# Patient Record
Sex: Female | Born: 1945 | Race: Black or African American | Hispanic: No | Marital: Single | Smoking: Never smoker
Health system: Southern US, Community
[De-identification: ages and names within clinical notes are randomized; demographics above are authoritative.]

## PROBLEM LIST (undated history)

## (undated) ENCOUNTER — Emergency Department (HOSPITAL_BASED_OUTPATIENT_CLINIC_OR_DEPARTMENT_OTHER): Discharge status: Absent without leave | Payer: Medicare Other

## (undated) DIAGNOSIS — I10 Essential (primary) hypertension: Secondary | ICD-10-CM

## (undated) DIAGNOSIS — E119 Type 2 diabetes mellitus without complications: Secondary | ICD-10-CM

## (undated) DIAGNOSIS — I639 Cerebral infarction, unspecified: Secondary | ICD-10-CM

## (undated) DIAGNOSIS — F32A Depression, unspecified: Secondary | ICD-10-CM

## (undated) DIAGNOSIS — I509 Heart failure, unspecified: Secondary | ICD-10-CM

## (undated) DIAGNOSIS — F319 Bipolar disorder, unspecified: Secondary | ICD-10-CM

## (undated) DIAGNOSIS — D649 Anemia, unspecified: Secondary | ICD-10-CM

## (undated) DIAGNOSIS — F329 Major depressive disorder, single episode, unspecified: Secondary | ICD-10-CM

## (undated) DIAGNOSIS — G629 Polyneuropathy, unspecified: Secondary | ICD-10-CM

---

## 2005-08-18 ENCOUNTER — Emergency Department (HOSPITAL_COMMUNITY): Admission: EM | Admit: 2005-08-18 | Discharge: 2005-08-18 | Payer: Self-pay | Admitting: Emergency Medicine

## 2013-02-17 ENCOUNTER — Emergency Department (HOSPITAL_COMMUNITY)
Admission: EM | Admit: 2013-02-17 | Discharge: 2013-02-17 | Disposition: A | Discharge status: Home / Self Care | Payer: Medicare Other | Attending: Emergency Medicine | Admitting: Emergency Medicine

## 2013-02-17 ENCOUNTER — Encounter (HOSPITAL_COMMUNITY): Payer: Self-pay | Admitting: *Deleted

## 2013-02-17 DIAGNOSIS — I129 Hypertensive chronic kidney disease with stage 1 through stage 4 chronic kidney disease, or unspecified chronic kidney disease: Secondary | ICD-10-CM | POA: Insufficient documentation

## 2013-02-17 DIAGNOSIS — N289 Disorder of kidney and ureter, unspecified: Secondary | ICD-10-CM | POA: Insufficient documentation

## 2013-02-17 DIAGNOSIS — Z794 Long term (current) use of insulin: Secondary | ICD-10-CM | POA: Insufficient documentation

## 2013-02-17 DIAGNOSIS — R059 Cough, unspecified: Secondary | ICD-10-CM | POA: Insufficient documentation

## 2013-02-17 DIAGNOSIS — R5381 Other malaise: Secondary | ICD-10-CM | POA: Insufficient documentation

## 2013-02-17 DIAGNOSIS — G589 Mononeuropathy, unspecified: Secondary | ICD-10-CM | POA: Insufficient documentation

## 2013-02-17 DIAGNOSIS — E1149 Type 2 diabetes mellitus with other diabetic neurological complication: Secondary | ICD-10-CM | POA: Insufficient documentation

## 2013-02-17 DIAGNOSIS — R05 Cough: Secondary | ICD-10-CM | POA: Insufficient documentation

## 2013-02-17 DIAGNOSIS — R531 Weakness: Secondary | ICD-10-CM

## 2013-02-17 DIAGNOSIS — I509 Heart failure, unspecified: Secondary | ICD-10-CM | POA: Insufficient documentation

## 2013-02-17 DIAGNOSIS — R109 Unspecified abdominal pain: Secondary | ICD-10-CM | POA: Insufficient documentation

## 2013-02-17 DIAGNOSIS — D649 Anemia, unspecified: Secondary | ICD-10-CM | POA: Insufficient documentation

## 2013-02-17 DIAGNOSIS — R002 Palpitations: Secondary | ICD-10-CM | POA: Insufficient documentation

## 2013-02-17 DIAGNOSIS — Z7982 Long term (current) use of aspirin: Secondary | ICD-10-CM | POA: Insufficient documentation

## 2013-02-17 DIAGNOSIS — Z79899 Other long term (current) drug therapy: Secondary | ICD-10-CM | POA: Insufficient documentation

## 2013-02-17 DIAGNOSIS — R0602 Shortness of breath: Secondary | ICD-10-CM | POA: Insufficient documentation

## 2013-02-17 HISTORY — DX: Cerebral infarction, unspecified: I63.9

## 2013-02-17 HISTORY — DX: Essential (primary) hypertension: I10

## 2013-02-17 HISTORY — DX: Anemia, unspecified: D64.9

## 2013-02-17 HISTORY — DX: Polyneuropathy, unspecified: G62.9

## 2013-02-17 HISTORY — DX: Heart failure, unspecified: I50.9

## 2013-02-17 HISTORY — DX: Type 2 diabetes mellitus without complications: E11.9

## 2013-02-17 LAB — URINALYSIS, ROUTINE W REFLEX MICROSCOPIC
Ketones, ur: NEGATIVE mg/dL
Leukocytes, UA: NEGATIVE
Nitrite: NEGATIVE
Protein, ur: NEGATIVE mg/dL
Specific Gravity, Urine: 1.01 (ref 1.005–1.030)
pH: 6 (ref 5.0–8.0)

## 2013-02-17 LAB — COMPREHENSIVE METABOLIC PANEL
Alkaline Phosphatase: 99 U/L (ref 39–117)
BUN: 14 mg/dL (ref 6–23)
Chloride: 95 mEq/L — ABNORMAL LOW (ref 96–112)
GFR calc Af Amer: 45 mL/min — ABNORMAL LOW (ref >90)
Glucose, Bld: 216 mg/dL — ABNORMAL HIGH (ref 70–99)
Potassium: 3.9 mEq/L (ref 3.5–5.1)
Total Bilirubin: 0.2 mg/dL — ABNORMAL LOW (ref 0.3–1.2)
Total Protein: 7.1 g/dL (ref 6.0–8.3)

## 2013-02-17 LAB — CBC WITH DIFFERENTIAL/PLATELET
Eosinophils Absolute: 0.7 10*3/uL (ref 0.0–0.7)
Eosinophils Relative: 9 % — ABNORMAL HIGH (ref 0–5)
HCT: 32 % — ABNORMAL LOW (ref 36.0–46.0)
Hemoglobin: 10.4 g/dL — ABNORMAL LOW (ref 12.0–15.0)
Lymphs Abs: 1.9 10*3/uL (ref 0.7–4.0)
MCH: 26.5 pg (ref 26.0–34.0)
MCV: 81.6 fL (ref 78.0–100.0)
Monocytes Absolute: 0.6 10*3/uL (ref 0.1–1.0)
Monocytes Relative: 8 % (ref 3–12)
Neutrophils Relative %: 60 % (ref 43–77)
RBC: 3.92 MIL/uL (ref 3.87–5.11)

## 2013-02-17 NOTE — ED Provider Notes (Signed)
CSN: [id number]     Arrival date & time 02/17/13  1959 History   First MD Initiated Contact with Patient 02/17/13 2208     Chief Complaint  Patient presents with  . Weakness  . Abdominal Pain   (Consider location/radiation/quality/duration/timing/severity/associated sxs/prior Treatment) HPI Comments: Samantha [name] is a 67 y.o. female who states that she has been weak and persistently for 2 days. She also has mild cough without sputum production, occasional shortness of breath and palpitations. She denies chest pain, headache, fever, chills, back pain, or change in bowel and urinary habits. She occasionally has dark stool, but she thinks is related to the iron that she takes. She has chronic intermittent anemia and has required blood transfusions, yearly for several years. She has seen a doctor and been evaluated for source of bleeding, but none has been found. She has had upper and lower endoscopy. She has not had a bone marrow evaluation. She has received Procrit in the past, but not recently. She has known renal insufficiency. She is new to town and has established with a primary care doctor. She had blood work done several months ago. There are no other known modifying factors.   Patient is a 68 y.o. female presenting with weakness and abdominal pain. The history is provided by the patient.  Weakness Associated symptoms include abdominal pain.  Abdominal Pain   Past Medical History  Diagnosis Date  . Anemia   . Stroke   . Diabetes mellitus without complication   . Hypertension   . Neuropathy   . CHF (congestive heart failure)    Past Surgical History  Procedure Laterality Date  . Cesarean section     No family history on file. History  Substance Use Topics  . Smoking status: Never Smoker   . Smokeless tobacco: Not on file  . Alcohol Use: No   OB History   Grav Para Term Preterm Abortions TAB SAB Ect Mult Living                 Review of Systems  Gastrointestinal:  Positive for abdominal pain.  Neurological: Positive for weakness.  All other systems reviewed and are negative.    Allergies  Peanut-containing drug products  Home Medications   Current Outpatient Rx  Name  Route  Sig  Dispense  Refill  . albuterol (PROVENTIL HFA;VENTOLIN HFA) 108 (90 BASE) MCG/ACT inhaler   Inhalation   Inhale 2 puffs into the lungs every 6 (six) hours as needed for wheezing.         Marland Kitchen allopurinol (ZYLOPRIM) 300 MG tablet   Oral   Take 300 mg by mouth daily.         Marland Kitchen aspirin EC 81 MG tablet   Oral   Take 81 mg by mouth daily.         Marland Kitchen atorvastatin (LIPITOR) 20 MG tablet   Oral   Take 20 mg by mouth daily.         . cloNIDine (CATAPRES - DOSED IN MG/24 HR) 0.3 mg/24hr patch   Transdermal   Place 1 patch onto the skin once a week.         Marland Kitchen dexlansoprazole (DEXILANT) 60 MG capsule   Oral   Take 60 mg by mouth daily.         Marland Kitchen epoetin alfa (EPOGEN,PROCRIT) 91478 UNIT/ML injection   Subcutaneous   Inject 10,000 Units into the skin every 14 (fourteen) days. As needed for kidneys         .  ferrous sulfate 325 (65 FE) MG tablet   Oral   Take 325 mg by mouth daily with breakfast.         . furosemide (LASIX) 20 MG tablet   Oral   Take 20 mg by mouth.         . gabapentin (NEURONTIN) 300 MG capsule   Oral   Take 300 mg by mouth 3 (three) times daily.         . hydrALAZINE (APRESOLINE) 50 MG tablet   Oral   Take 50 mg by mouth 3 (three) times daily.         . insulin glargine (LANTUS) 100 UNIT/ML injection   Subcutaneous   Inject 10-80 Units into the skin 2 (two) times daily. 10 units in the morning and 80 units at night         . Liraglutide (VICTOZA) 18 MG/3ML SOPN   Subcutaneous   Inject into the skin.         Marland Kitchen LORazepam (ATIVAN) 0.5 MG tablet   Oral   Take 0.5 mg by mouth every 8 (eight) hours.         . metoprolol (TOPROL-XL) 200 MG 24 hr tablet   Oral   Take 200 mg by mouth daily.         .  QUEtiapine Fumarate (SEROQUEL XR) 150 MG 24 hr tablet   Oral   Take 150 mg by mouth at bedtime. Started with 75 mg at bedtime for 1 week on 01/23/13         . venlafaxine (EFFEXOR) 75 MG tablet   Oral   Take 75 mg by mouth 2 (two) times daily.          BP 143/65  Pulse 69  Temp(Src) 98.5 F (36.9 C) (Oral)  Resp 16  SpO2 98% Physical Exam  Nursing note and vitals reviewed. Constitutional: She is oriented to person, place, and time. She appears well-developed.  Frail, elderly. Appears older than stated age.  HENT:  Head: Normocephalic and atraumatic.  Eyes: Conjunctivae and EOM are normal. Pupils are equal, round, and reactive to light.  Neck: Normal range of motion and phonation normal. Neck supple.  Cardiovascular: Normal rate, regular rhythm and intact distal pulses.   Pulmonary/Chest: Effort normal and breath sounds normal. She exhibits no tenderness.  Abdominal: Soft. She exhibits no distension. There is no tenderness. There is no guarding.  Genitourinary:  Brown stool in rectal vault. No rectal mass or bleeding.  Musculoskeletal: Normal range of motion.  Neurological: She is alert and oriented to person, place, and time. She exhibits normal muscle tone.  Skin: Skin is warm and dry.  Psychiatric: She has a normal mood and affect. Her behavior is normal. Judgment and thought content normal.    ED Course  Procedures (including critical care time) Labs Review Labs Reviewed  CBC WITH DIFFERENTIAL - Abnormal; Notable for the following:    Hemoglobin 10.4 (*)    HCT 32.0 (*)    RDW 17.9 (*)    Eosinophils Relative 9 (*)    All other components within normal limits  COMPREHENSIVE METABOLIC PANEL - Abnormal; Notable for the following:    Sodium 129 (*)    Chloride 95 (*)    Glucose, Bld 216 (*)    Creatinine, Ser 1.39 (*)    Albumin 3.3 (*)    Total Bilirubin 0.2 (*)    GFR calc non Af Amer 39 (*)    GFR calc Af Samantha [name]  45 (*)    All other components within normal  limits  URINALYSIS, ROUTINE W REFLEX MICROSCOPIC  OCCULT BLOOD, POC DEVICE   Imaging Review No results found.  MDM   1. Weakness   2. Anemia      Nonspecific weakness, with mild anemia, and renal insufficiency. No comparison labs are available. The patient is nontoxic. She has a followup appointment scheduled with her PCP, in 2 days.    Nursing Notes Reviewed/ Care Coordinated, and agree without changes. Applicable Imaging Reviewed.  Interpretation of Laboratory Data incorporated into ED treatment    Plan: Home Medications- usual; Home Treatments and Observation- rest, fluids; return here if the recommended treatment, does not improve the symptoms; Recommended follow up- PCP, as scheduled. She may need referral to a nephrologist or a hematologist for further evaluation, as an outpatient.     Flint Melter, MD 02/18/13 [phone]

## 2013-02-17 NOTE — ED Notes (Signed)
Pt c/o weakness x 2 days; abd pain; states stools are always dark due to iron pills; history of anemia; cough; some shortness of breath

## 2013-02-17 NOTE — Progress Notes (Signed)
Patient confirms her pcp is Dr. Burton Apley and has an appointment on Wednesday.  Sysytem updated.

## 2013-07-14 ENCOUNTER — Ambulatory Visit: Payer: Self-pay | Admitting: Podiatry

## 2013-08-29 ENCOUNTER — Emergency Department (EMERGENCY_DEPARTMENT_HOSPITAL)
Admission: EM | Admit: 2013-08-29 | Discharge: 2013-08-29 | Disposition: A | Discharge status: Other Acute Inpatient Hospital | Payer: Medicare Other | Source: Home / Self Care | Attending: Emergency Medicine | Admitting: Emergency Medicine

## 2013-08-29 ENCOUNTER — Encounter (HOSPITAL_COMMUNITY): Payer: Self-pay | Admitting: *Deleted

## 2013-08-29 ENCOUNTER — Inpatient Hospital Stay (HOSPITAL_COMMUNITY)
Admission: AD | Admit: 2013-08-29 | Discharge: 2013-08-31 | DRG: 885 | Disposition: A | Discharge status: Other Acute Inpatient Hospital | Payer: Medicare Other | Source: Intra-hospital | Attending: Internal Medicine | Admitting: Internal Medicine

## 2013-08-29 ENCOUNTER — Encounter (HOSPITAL_COMMUNITY): Payer: Self-pay | Admitting: Emergency Medicine

## 2013-08-29 DIAGNOSIS — F319 Bipolar disorder, unspecified: Secondary | ICD-10-CM

## 2013-08-29 DIAGNOSIS — R45851 Suicidal ideations: Secondary | ICD-10-CM

## 2013-08-29 DIAGNOSIS — F315 Bipolar disorder, current episode depressed, severe, with psychotic features: Secondary | ICD-10-CM

## 2013-08-29 DIAGNOSIS — I10 Essential (primary) hypertension: Secondary | ICD-10-CM | POA: Diagnosis present

## 2013-08-29 DIAGNOSIS — R739 Hyperglycemia, unspecified: Secondary | ICD-10-CM

## 2013-08-29 DIAGNOSIS — E119 Type 2 diabetes mellitus without complications: Secondary | ICD-10-CM | POA: Diagnosis present

## 2013-08-29 DIAGNOSIS — I509 Heart failure, unspecified: Secondary | ICD-10-CM | POA: Diagnosis present

## 2013-08-29 DIAGNOSIS — F29 Unspecified psychosis not due to a substance or known physiological condition: Secondary | ICD-10-CM | POA: Diagnosis present

## 2013-08-29 DIAGNOSIS — Z8673 Personal history of transient ischemic attack (TIA), and cerebral infarction without residual deficits: Secondary | ICD-10-CM

## 2013-08-29 DIAGNOSIS — F411 Generalized anxiety disorder: Secondary | ICD-10-CM | POA: Diagnosis present

## 2013-08-29 DIAGNOSIS — F3130 Bipolar disorder, current episode depressed, mild or moderate severity, unspecified: Principal | ICD-10-CM | POA: Diagnosis present

## 2013-08-29 DIAGNOSIS — Z794 Long term (current) use of insulin: Secondary | ICD-10-CM

## 2013-08-29 HISTORY — DX: Major depressive disorder, single episode, unspecified: F32.9

## 2013-08-29 HISTORY — DX: Depression, unspecified: F32.A

## 2013-08-29 HISTORY — DX: Bipolar disorder, unspecified: F31.9

## 2013-08-29 LAB — BASIC METABOLIC PANEL
BUN: 25 mg/dL — ABNORMAL HIGH (ref 6–23)
CALCIUM: 9.9 mg/dL (ref 8.4–10.5)
CHLORIDE: 94 meq/L — AB (ref 96–112)
CO2: 26 meq/L (ref 19–32)
Creatinine, Ser: 1.62 mg/dL — ABNORMAL HIGH (ref 0.50–1.10)
GFR calc Af Amer: 37 mL/min — ABNORMAL LOW (ref >90)
GFR calc non Af Amer: 32 mL/min — ABNORMAL LOW (ref >90)
Glucose, Bld: 291 mg/dL — ABNORMAL HIGH (ref 70–99)
Potassium: 5.1 mEq/L (ref 3.7–5.3)
Sodium: 133 mEq/L — ABNORMAL LOW (ref 137–147)

## 2013-08-29 LAB — RAPID URINE DRUG SCREEN, HOSP PERFORMED
Amphetamines: NOT DETECTED
Barbiturates: NOT DETECTED
Benzodiazepines: NOT DETECTED
Cocaine: NOT DETECTED
OPIATES: NOT DETECTED
Tetrahydrocannabinol: NOT DETECTED

## 2013-08-29 LAB — URINALYSIS, ROUTINE W REFLEX MICROSCOPIC
Bilirubin Urine: NEGATIVE
GLUCOSE, UA: NEGATIVE mg/dL
HGB URINE DIPSTICK: NEGATIVE
Ketones, ur: NEGATIVE mg/dL
Leukocytes, UA: NEGATIVE
Nitrite: NEGATIVE
Protein, ur: NEGATIVE mg/dL
SPECIFIC GRAVITY, URINE: 1.008 (ref 1.005–1.030)
Urobilinogen, UA: 0.2 mg/dL (ref 0.0–1.0)
pH: 6 (ref 5.0–8.0)

## 2013-08-29 LAB — GLUCOSE, CAPILLARY
GLUCOSE-CAPILLARY: 270 mg/dL — AB (ref 70–99)
GLUCOSE-CAPILLARY: 277 mg/dL — AB (ref 70–99)

## 2013-08-29 LAB — CBC WITH DIFFERENTIAL/PLATELET
Basophils Absolute: 0 10*3/uL (ref 0.0–0.1)
Basophils Relative: 0 % (ref 0–1)
Eosinophils Absolute: 0.7 10*3/uL (ref 0.0–0.7)
Eosinophils Relative: 7 % — ABNORMAL HIGH (ref 0–5)
HEMATOCRIT: 36 % (ref 36.0–46.0)
HEMOGLOBIN: 11.3 g/dL — AB (ref 12.0–15.0)
LYMPHS PCT: 18 % (ref 12–46)
Lymphs Abs: 1.9 10*3/uL (ref 0.7–4.0)
MCH: 24.6 pg — ABNORMAL LOW (ref 26.0–34.0)
MCHC: 31.4 g/dL (ref 30.0–36.0)
MCV: 78.4 fL (ref 78.0–100.0)
MONO ABS: 0.6 10*3/uL (ref 0.1–1.0)
MONOS PCT: 6 % (ref 3–12)
Neutro Abs: 7.2 10*3/uL (ref 1.7–7.7)
Neutrophils Relative %: 69 % (ref 43–77)
Platelets: 290 10*3/uL (ref 150–400)
RBC: 4.59 MIL/uL (ref 3.87–5.11)
RDW: 20.2 % — ABNORMAL HIGH (ref 11.5–15.5)
WBC: 10.5 10*3/uL (ref 4.0–10.5)

## 2013-08-29 LAB — ACETAMINOPHEN LEVEL: Acetaminophen (Tylenol), Serum: <15 ug/mL (ref 10–30)

## 2013-08-29 LAB — CBG MONITORING, ED: Glucose-Capillary: 211 mg/dL — ABNORMAL HIGH (ref 70–99)

## 2013-08-29 LAB — SALICYLATE LEVEL: Salicylate Lvl: 5.2 mg/dL (ref 2.8–20.0)

## 2013-08-29 LAB — ETHANOL

## 2013-08-29 MED ORDER — VENLAFAXINE HCL 75 MG PO TABS
75.0000 mg | ORAL_TABLET | Freq: Two times a day (BID) | ORAL | Status: DC
  Filled 2013-08-29 (×2): qty 1

## 2013-08-29 MED ORDER — LURASIDONE HCL 40 MG PO TABS
40.0000 mg | ORAL_TABLET | Freq: Every day | ORAL | Status: DC
  Filled 2013-08-29: qty 1

## 2013-08-29 MED ORDER — INSULIN GLARGINE 100 UNIT/ML ~~LOC~~ SOLN: 10.0000 [IU] | Freq: Every day | SUBCUTANEOUS | Status: DC

## 2013-08-29 MED ORDER — INSULIN ASPART 100 UNIT/ML ~~LOC~~ SOLN
0.0000 [IU] | Freq: Three times a day (TID) | SUBCUTANEOUS | Status: DC
  Administered 2013-08-29: 8 [IU] via SUBCUTANEOUS
  Administered 2013-08-30: 5 [IU] via SUBCUTANEOUS
  Administered 2013-08-30: 2 [IU] via SUBCUTANEOUS
  Administered 2013-08-30: 15 [IU] via SUBCUTANEOUS
  Administered 2013-08-31: 3 [IU] via SUBCUTANEOUS

## 2013-08-29 MED ORDER — ATORVASTATIN CALCIUM 20 MG PO TABS
20.0000 mg | ORAL_TABLET | Freq: Every day | ORAL | Status: DC
  Administered 2013-08-30 – 2013-08-31 (×2): 20 mg via ORAL
  Filled 2013-08-29 (×4): qty 1

## 2013-08-29 MED ORDER — FUROSEMIDE 40 MG PO TABS
20.0000 mg | ORAL_TABLET | Freq: Every day | ORAL | Status: DC
  Administered 2013-08-30 – 2013-08-31 (×2): 20 mg via ORAL
  Filled 2013-08-29 (×4): qty 1

## 2013-08-29 MED ORDER — CLONIDINE HCL 0.3 MG/24HR TD PTWK
0.3000 mg | MEDICATED_PATCH | TRANSDERMAL | Status: DC
  Administered 2013-08-29: 0.3 mg via TRANSDERMAL
  Filled 2013-08-29 (×2): qty 1

## 2013-08-29 MED ORDER — LORAZEPAM 0.5 MG PO TABS
0.5000 mg | ORAL_TABLET | Freq: Three times a day (TID) | ORAL | Status: DC
  Administered 2013-08-29: 0.5 mg via ORAL
  Filled 2013-08-29: qty 1

## 2013-08-29 MED ORDER — ATORVASTATIN CALCIUM 20 MG PO TABS: 20.0000 mg | ORAL_TABLET | Freq: Every day | ORAL | Status: DC

## 2013-08-29 MED ORDER — ACETAMINOPHEN 325 MG PO TABS: 650.0000 mg | ORAL_TABLET | Freq: Four times a day (QID) | ORAL | Status: DC | PRN

## 2013-08-29 MED ORDER — INSULIN GLARGINE 100 UNIT/ML ~~LOC~~ SOLN
80.0000 [IU] | Freq: Every day | SUBCUTANEOUS | Status: DC
  Administered 2013-08-29 – 2013-08-30 (×2): 80 [IU] via SUBCUTANEOUS
  Filled 2013-08-29: qty 0.8

## 2013-08-29 MED ORDER — CLONIDINE HCL 0.3 MG/24HR TD PTWK: 0.3000 mg | MEDICATED_PATCH | TRANSDERMAL | Status: DC

## 2013-08-29 MED ORDER — ALBUTEROL SULFATE HFA 108 (90 BASE) MCG/ACT IN AERS: 2.0000 | INHALATION_SPRAY | Freq: Four times a day (QID) | RESPIRATORY_TRACT | Status: DC | PRN

## 2013-08-29 MED ORDER — FUROSEMIDE 20 MG PO TABS: 20.0000 mg | ORAL_TABLET | Freq: Every day | ORAL | Status: DC

## 2013-08-29 MED ORDER — FERROUS SULFATE 325 (65 FE) MG PO TABS
325.0000 mg | ORAL_TABLET | Freq: Every day | ORAL | Status: DC
  Filled 2013-08-29: qty 1

## 2013-08-29 MED ORDER — ALLOPURINOL 300 MG PO TABS
300.0000 mg | ORAL_TABLET | Freq: Every day | ORAL | Status: DC
Start: 2013-08-30 — End: 2013-08-29

## 2013-08-29 MED ORDER — METOPROLOL SUCCINATE ER 100 MG PO TB24
200.0000 mg | ORAL_TABLET | Freq: Every day | ORAL | Status: DC
  Administered 2013-08-30 – 2013-08-31 (×2): 200 mg via ORAL
  Filled 2013-08-29 (×4): qty 2

## 2013-08-29 MED ORDER — ALLOPURINOL 300 MG PO TABS
300.0000 mg | ORAL_TABLET | Freq: Every day | ORAL | Status: DC
  Administered 2013-08-30 – 2013-08-31 (×2): 300 mg via ORAL
  Filled 2013-08-29 (×4): qty 1

## 2013-08-29 MED ORDER — ALUM & MAG HYDROXIDE-SIMETH 200-200-20 MG/5ML PO SUSP
30.0000 mL | ORAL | Status: DC | PRN
  Administered 2013-08-30: 30 mL via ORAL

## 2013-08-29 MED ORDER — ALBUTEROL SULFATE (2.5 MG/3ML) 0.083% IN NEBU
2.5000 mg | INHALATION_SOLUTION | Freq: Four times a day (QID) | RESPIRATORY_TRACT | Status: DC | PRN
  Filled 2013-08-29: qty 3

## 2013-08-29 MED ORDER — INSULIN GLARGINE 100 UNIT/ML ~~LOC~~ SOLN
10.0000 [IU] | Freq: Every day | SUBCUTANEOUS | Status: DC
  Administered 2013-08-30 – 2013-08-31 (×2): 10 [IU] via SUBCUTANEOUS
  Filled 2013-08-29: qty 0.1

## 2013-08-29 MED ORDER — PANTOPRAZOLE SODIUM 40 MG PO TBEC
40.0000 mg | DELAYED_RELEASE_TABLET | Freq: Every day | ORAL | Status: DC
  Administered 2013-08-29: 40 mg via ORAL
  Filled 2013-08-29: qty 1

## 2013-08-29 MED ORDER — ASPIRIN EC 81 MG PO TBEC: 81.0000 mg | DELAYED_RELEASE_TABLET | Freq: Every day | ORAL | Status: DC

## 2013-08-29 MED ORDER — LORAZEPAM 0.5 MG PO TABS
0.5000 mg | ORAL_TABLET | Freq: Three times a day (TID) | ORAL | Status: DC
  Administered 2013-08-29 – 2013-08-31 (×6): 0.5 mg via ORAL
  Filled 2013-08-29 (×6): qty 1

## 2013-08-29 MED ORDER — ASPIRIN EC 81 MG PO TBEC
81.0000 mg | DELAYED_RELEASE_TABLET | Freq: Every day | ORAL | Status: DC
  Administered 2013-08-30 – 2013-08-31 (×2): 81 mg via ORAL
  Filled 2013-08-29 (×4): qty 1

## 2013-08-29 MED ORDER — PANTOPRAZOLE SODIUM 40 MG PO TBEC
40.0000 mg | DELAYED_RELEASE_TABLET | Freq: Every day | ORAL | Status: DC
  Administered 2013-08-30 – 2013-08-31 (×2): 40 mg via ORAL
  Filled 2013-08-29 (×4): qty 1

## 2013-08-29 MED ORDER — LURASIDONE HCL 40 MG PO TABS
40.0000 mg | ORAL_TABLET | Freq: Every day | ORAL | Status: DC
  Administered 2013-08-30 – 2013-08-31 (×2): 40 mg via ORAL
  Filled 2013-08-29 (×4): qty 1

## 2013-08-29 MED ORDER — MAGNESIUM HYDROXIDE 400 MG/5ML PO SUSP: 30.0000 mL | Freq: Every day | ORAL | Status: DC | PRN

## 2013-08-29 MED ORDER — GABAPENTIN 300 MG PO CAPS
300.0000 mg | ORAL_CAPSULE | Freq: Three times a day (TID) | ORAL | Status: DC
  Administered 2013-08-29 – 2013-08-31 (×7): 300 mg via ORAL
  Filled 2013-08-29 (×6): qty 1
  Filled 2013-08-29: qty 3
  Filled 2013-08-29 (×6): qty 1

## 2013-08-29 MED ORDER — FERROUS SULFATE 325 (65 FE) MG PO TABS
325.0000 mg | ORAL_TABLET | Freq: Every day | ORAL | Status: DC
  Administered 2013-08-30 – 2013-08-31 (×2): 325 mg via ORAL
  Filled 2013-08-29 (×4): qty 1

## 2013-08-29 MED ORDER — VENLAFAXINE HCL 75 MG PO TABS
75.0000 mg | ORAL_TABLET | Freq: Two times a day (BID) | ORAL | Status: DC
  Administered 2013-08-29 – 2013-08-31 (×5): 75 mg via ORAL
  Filled 2013-08-29 (×8): qty 1
  Filled 2013-08-29 (×2): qty 2

## 2013-08-29 MED ORDER — INSULIN GLARGINE 100 UNIT/ML ~~LOC~~ SOLN
80.0000 [IU] | Freq: Every day | SUBCUTANEOUS | Status: DC
  Filled 2013-08-29: qty 0.8

## 2013-08-29 MED ORDER — GABAPENTIN 300 MG PO CAPS
300.0000 mg | ORAL_CAPSULE | Freq: Three times a day (TID) | ORAL | Status: DC
  Filled 2013-08-29 (×2): qty 1

## 2013-08-29 MED ORDER — METOPROLOL SUCCINATE ER 100 MG PO TB24
200.0000 mg | ORAL_TABLET | Freq: Every day | ORAL | Status: DC
  Filled 2013-08-29: qty 2

## 2013-08-29 MED ORDER — INSULIN ASPART 100 UNIT/ML ~~LOC~~ SOLN
0.0000 [IU] | Freq: Three times a day (TID) | SUBCUTANEOUS | Status: DC
  Administered 2013-08-29: 5 [IU] via SUBCUTANEOUS
  Filled 2013-08-29: qty 1
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
## 2013-08-29 NOTE — Tx Team (Signed)
Initial Interdisciplinary Treatment Plan  PATIENT STRENGTHS: (choose at least two) Ability for insight Average or above average intelligence General fund of knowledge  PATIENT STRESSORS: Financial difficulties Loss of  home in [facility] and moved here 3 months ago.   PROBLEM LIST: Problem List/Patient Goals Date to be addressed Date deferred Reason deferred Estimated date of resolution  depression 08/29/2013     Suicidal ideations 08/29/2013                                                DISCHARGE CRITERIA:  Improved stabilization in mood, thinking, and/or behavior Need for constant or close observation no longer present  PRELIMINARY DISCHARGE PLAN: Outpatient therapy Placement in alternative living arrangements Return to previous living arrangement  PATIENT/FAMIILY INVOLVEMENT: This treatment plan has been presented to and reviewed with the patient, Samantha [name].  The patient and family have been given the opportunity to ask questions and make suggestions.  Raphael Gibney 08/29/2013, 4:32 PM

## 2013-08-29 NOTE — BH Assessment (Signed)
[facility] Progress Note  Patient accepted by Dr. Olena Heckle to [facility] bed 500-2.  Dr. Winfred Leeds agrees with disposition.  Pt will sign voluntary consent and be transported by [facility].

## 2013-08-29 NOTE — ED Notes (Signed)
Bed: [facility]41 Expected date:  Expected time:  Means of arrival:  Comments: Hold triage 3 
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
## 2013-08-29 NOTE — Progress Notes (Signed)
[facility] Group Notes:  (Nursing/MHT/Case Management/Adjunct)  Date:  08/29/2013  Time:  9:16 PM  Type of Therapy:  Group Therapy  Participation Level:  Minimal  Participation Quality:  Appropriate  Affect:  Appropriate  Cognitive:  Appropriate  Insight:  Improving  Engagement in Group:  Developing/Improving  Modes of Intervention:  Socialization and Support  Summary of Progress/Problems: Pt. Was engaged in group discussion.  Pt. Stated paranoia and isolation as early waring signs for relapse.  Lanell Persons 08/29/2013, 9:16 PM

## 2013-08-30 ENCOUNTER — Encounter (HOSPITAL_COMMUNITY): Payer: Self-pay | Admitting: Registered Nurse

## 2013-08-30 DIAGNOSIS — F3130 Bipolar disorder, current episode depressed, mild or moderate severity, unspecified: Principal | ICD-10-CM

## 2013-08-30 DIAGNOSIS — R45851 Suicidal ideations: Secondary | ICD-10-CM

## 2013-08-30 DIAGNOSIS — F4323 Adjustment disorder with mixed anxiety and depressed mood: Secondary | ICD-10-CM

## 2013-08-30 LAB — GLUCOSE, CAPILLARY
GLUCOSE-CAPILLARY: 403 mg/dL — AB (ref 70–99)
Glucose-Capillary: 125 mg/dL — ABNORMAL HIGH (ref 70–99)
Glucose-Capillary: 247 mg/dL — ABNORMAL HIGH (ref 70–99)
Glucose-Capillary: 395 mg/dL — ABNORMAL HIGH (ref 70–99)

## 2013-08-30 MED ORDER — HYDROXYZINE HCL 25 MG PO TABS
50.0000 mg | ORAL_TABLET | Freq: Every evening | ORAL | Status: DC | PRN
  Administered 2013-08-30: 50 mg via ORAL
  Filled 2013-08-30: qty 1

## 2013-08-30 NOTE — BHH Group Notes (Signed)
[facility] LCSW Group Therapy  08/30/2013 4:18 PM  Type of Therapy:  Group Therapy  Participation Level:  None  Participation Quality:  Attentive  Affect:  Depressed and Flat  Cognitive:  Alert and Oriented  Insight:  UTA  Engagement in Therapy:  None  Modes of Intervention:  Discussion, Exploration, Problem-solving, Rapport Building, Socialization and Support  Summary of Progress/Problems: The main focus of today's process group was to identify the patient's current support system and decide on other supports that can be put in place. An emphasis was placed on using counselor, doctor, therapy groups, 12-step groups, and problem-specific support groups to expand supports, as well as doing something different than has been done before.   Samantha [name] was observed to be attentive in group as she provided eye contact with her peers and CSW throughout the discussion. She refrained from providing any contributions towards the discussion as she remained silent and apprehensive to discuss her support system and how it affects her overall wellness. Patient mood and affect continues to present as depressed and flat within therapeutic milieu.   Harriet Masson. 08/30/2013, 4:18 PM

## 2013-08-30 NOTE — Progress Notes (Signed)
Adult Psychoeducational Group Note  Date:  08/30/2013 Time:  5:23 PM  Group Topic/Focus:  Healthy Communication:   The focus of this group is to discuss communication, barriers to communication, as well as healthy ways to communicate with others.  Participation Level:  Minimal  Participation Quality:  Attentive and Resistant  Affect:  Flat  Cognitive:  Oriented  Insight: Limited  Engagement in Group:  None  Modes of Intervention:  Discussion, Socialization and Support    Elisha Headland 08/30/2013, 5:23 PM

## 2013-08-30 NOTE — Progress Notes (Signed)
.  Psychoeducational Group Note    Date: 08/30/2013 Time: 0930  Goal Setting Purpose of Group: To be able to set [name] goal that is measurable and that can be accomplished in one day Participation Level:  Did not attend  Samantha [name]  

## 2013-08-30 NOTE — Progress Notes (Signed)
Patient ID: Samantha [name], female   DOB: 1946-03-27, 68 y.o.   MRN: [id number] D: Patient mood and affect appeared depressed and sad. Pt repots concerned over son who is incarcerated. Pt rates depression and anxiety as 7 on a 0-10 scale. Pt denies SI/HI/AVH and pain. Pt attended evening wrap up group and engaged in discussion. Pt denies any needs or concerns. Cooperative with assessment. No acute distressed noted at this time.   A: Met with pt 1:1. Medications administered as prescribed. Writer encouraged pt to discuss feelings. Pt encouraged to come to staff with any questions or concerns.   R: Patient is safe on the unit. She is complaint with medications and denies any adverse reaction. Continue current POC.

## 2013-08-30 NOTE — H&P (Signed)
FNP assessment and plan was reviewed. Agree with above assessment and plan.  Skip Estimable, MD

## 2013-08-30 NOTE — [facility] Suicide Risk Assessment (Signed)
   Nursing information obtained from:  Patient Demographic factors:  Divorced or widowed;Low socioeconomic status Current Mental Status:  Suicidal ideation indicated by patient;Suicide plan Loss Factors:  Decrease in vocational status;Financial problems / change in socioeconomic status Historical Factors:  Prior suicide attempts Risk Reduction Factors:  Sense of responsibility to family;Religious beliefs about death Total Time spent with patient: 1 hour  CLINICAL FACTORS:   Bipolar disorder  Psychiatric Specialty Exam: Physical Exam  ROS  Blood pressure 135/85, pulse 67, temperature 97.4 F (36.3 C), resp. rate 18, height 5\' 4"  (1.626 m), weight 93.441 kg (206 lb), SpO2 98.00%.Body mass index is 35.34 kg/(m^2).  General Appearance: Casual  Eye Contact::  Fair  Speech:  Normal Rate  Volume:  Normal  Mood:  Depressed  Affect:  Appropriate  Thought Process:  Negative  Orientation:  Negative  Thought Content:  Hallucinations: Visual  Suicidal Thoughts:  No  Homicidal Thoughts:  No  Memory:  Negative  Judgement:  Poor  Insight:  Shallow  Psychomotor Activity:  Decreased  Concentration:  Fair  Recall:  [place] of Knowledge:Negative  Language: Negative  Akathisia:  Negative  Handed:  Right  AIMS (if indicated):     Assets:  Communication Skills Desire for Improvement  Sleep:      Musculoskeletal: Strength & Muscle Tone: within normal limits Gait & Station: normal Patient leans: Front  COGNITIVE FEATURES THAT CONTRIBUTE TO RISK:  Closed-mindedness Polarized thinking    SUICIDE RISK:   Moderate:  Frequent suicidal ideation with limited intensity, and duration, some specificity in terms of plans, no associated intent, good self-control, limited dysphoria/symptomatology, some risk factors present, and identifiable protective factors, including available and accessible social support.  PLAN OF CARE:  I certify that inpatient services furnished can reasonably be  expected to improve the patient's condition.  Demoni Gergen P Aristotle Lieb 08/30/2013, 2:23 PM

## 2013-08-30 NOTE — Progress Notes (Signed)
Samantha [name] is seen OOB UAL on the 300 hall today. She is quiet. She is compliant with her medications as evidenced by taking all of her meds as they are ordered by her MD. She allows staff to monitor her cbg ac and hs.    A SHe attends her groups and she is engaged in her recovery as evidenced by her asking questions, processing with staff and then  She completes her morning self inventory and on it she writes she has experienced positive SI within the previous 24 hrs, but she contracts to NOT hurt herslef, she rates her depression and hopelessness "5/1" , respectively and says her DC plan is cnetered around " I will stop letting others tell me what to do ".   R Safety is in place and poc ocnt.

## 2013-08-30 NOTE — Progress Notes (Signed)
Psychoeducational Group Note  Date: 08/30/2013 Time:  1015  Group Topic/Focus:  Identifying Needs:   The focus of this group is to help patients identify their personal needs that have been historically problematic and identify healthy behaviors to address their needs.  Participation Level:  Active  Participation Quality:  Attentive  Affect:  Flat  Cognitive:  Oriented  Insight:  Improving  Engagement in Group:  Engaged  Additional Comments:  Pt attended the group and partisipated  Matilda Fleig A 

## 2013-08-30 NOTE — Progress Notes (Signed)
Writer spoke with patient 1:1 at the medication window to receive her 2000 scheduled medication. Patient reports having had a good day today. She reports that she once lived at [facility] but didn't like it there. She plans to live with her daughter, she reports that they don't get along the best but they love each other. She c/o indigestion and received MOM. Writer will follow up with med given. She reports that she did not sleep well last night but just laid there when the checks were done. Writer will speak with PA to see if order for sleep aid can be given. Support and encouragement given, safety maintained on unit with 15 min checks.
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
## 2013-08-30 NOTE — Progress Notes (Signed)
The focus of this group is to help patients review their daily goal of treatment and discuss progress on daily workbooks. Pt attended the evening group session and responded to all discussion prompts from the [facility]. Pt shared that today was a good day on the unit, the highlight of which was the RN group. Pt said that she took something important away from the group, which was to stand up for herself more. Pt's only additional need from Nursing Staff this evening was for towels, which were given to her following group. Pt's affect was appropriate.

## 2013-08-31 ENCOUNTER — Encounter (HOSPITAL_COMMUNITY): Payer: Self-pay | Admitting: Emergency Medicine

## 2013-08-31 ENCOUNTER — Inpatient Hospital Stay (HOSPITAL_COMMUNITY): Payer: Medicare Other

## 2013-08-31 DIAGNOSIS — R739 Hyperglycemia, unspecified: Secondary | ICD-10-CM | POA: Diagnosis present

## 2013-08-31 LAB — BASIC METABOLIC PANEL
BUN: 30 mg/dL — ABNORMAL HIGH (ref 6–23)
CHLORIDE: 95 meq/L — AB (ref 96–112)
CO2: 24 meq/L (ref 19–32)
Calcium: 8.9 mg/dL (ref 8.4–10.5)
Creatinine, Ser: 1.85 mg/dL — ABNORMAL HIGH (ref 0.50–1.10)
GFR calc Af Amer: 32 mL/min — ABNORMAL LOW (ref >90)
GFR calc non Af Amer: 27 mL/min — ABNORMAL LOW (ref >90)
GLUCOSE: 462 mg/dL — AB (ref 70–99)
Potassium: 5.2 mEq/L (ref 3.7–5.3)
SODIUM: 128 meq/L — AB (ref 137–147)

## 2013-08-31 LAB — GLUCOSE, CAPILLARY
GLUCOSE-CAPILLARY: 486 mg/dL — AB (ref 70–99)
Glucose-Capillary: 192 mg/dL — ABNORMAL HIGH (ref 70–99)
Glucose-Capillary: 306 mg/dL — ABNORMAL HIGH (ref 70–99)
Glucose-Capillary: 426 mg/dL — ABNORMAL HIGH (ref 70–99)

## 2013-08-31 LAB — PRO B NATRIURETIC PEPTIDE: PRO B NATRI PEPTIDE: 102 pg/mL (ref 0–125)

## 2013-08-31 LAB — CBC WITH DIFFERENTIAL/PLATELET
BASOS ABS: 0 10*3/uL (ref 0.0–0.1)
Basophils Relative: 0 % (ref 0–1)
EOS PCT: 8 % — AB (ref 0–5)
Eosinophils Absolute: 0.6 10*3/uL (ref 0.0–0.7)
HCT: 29.1 % — ABNORMAL LOW (ref 36.0–46.0)
Hemoglobin: 9.3 g/dL — ABNORMAL LOW (ref 12.0–15.0)
LYMPHS ABS: 1.8 10*3/uL (ref 0.7–4.0)
LYMPHS PCT: 25 % (ref 12–46)
MCH: 25.1 pg — ABNORMAL LOW (ref 26.0–34.0)
MCHC: 32 g/dL (ref 30.0–36.0)
MCV: 78.4 fL (ref 78.0–100.0)
Monocytes Absolute: 0.5 10*3/uL (ref 0.1–1.0)
Monocytes Relative: 7 % (ref 3–12)
NEUTROS ABS: 4.2 10*3/uL (ref 1.7–7.7)
Neutrophils Relative %: 59 % (ref 43–77)
Platelets: 233 10*3/uL (ref 150–400)
RBC: 3.71 MIL/uL — AB (ref 3.87–5.11)
RDW: 19.9 % — AB (ref 11.5–15.5)
WBC: 7.1 10*3/uL (ref 4.0–10.5)

## 2013-08-31 LAB — CBG MONITORING, ED
GLUCOSE-CAPILLARY: 350 mg/dL — AB (ref 70–99)
Glucose-Capillary: 456 mg/dL — ABNORMAL HIGH (ref 70–99)

## 2013-08-31 MED ORDER — INSULIN REGULAR HUMAN 100 UNIT/ML IJ SOLN
INTRAMUSCULAR | Status: DC
  Filled 2013-08-31: qty 1

## 2013-08-31 MED ORDER — SODIUM CHLORIDE 0.9 % IV SOLN
INTRAVENOUS | Status: DC
  Administered 2013-08-31: 2.9 [IU]/h via INTRAVENOUS
  Filled 2013-08-31: qty 1

## 2013-08-31 MED ORDER — INSULIN ASPART 100 UNIT/ML ~~LOC~~ SOLN
0.0000 [IU] | Freq: Three times a day (TID) | SUBCUTANEOUS | Status: DC
  Administered 2013-08-31: 15 [IU] via SUBCUTANEOUS

## 2013-08-31 MED ORDER — SODIUM CHLORIDE 0.9 % IV BOLUS (SEPSIS)
250.0000 mL | Freq: Once | INTRAVENOUS | Status: AC
  Administered 2013-08-31: 250 mL via INTRAVENOUS

## 2013-08-31 MED ORDER — SODIUM CHLORIDE 0.9 % IV SOLN: INTRAVENOUS | Status: DC

## 2013-08-31 MED ORDER — SODIUM CHLORIDE 0.9 % IV SOLN
INTRAVENOUS | Status: DC
  Administered 2013-08-31: 23:00:00 via INTRAVENOUS

## 2013-08-31 MED ORDER — DEXTROSE 50 % IV SOLN: 25.0000 mL | INTRAVENOUS | Status: DC | PRN

## 2013-08-31 MED ORDER — INSULIN ASPART 100 UNIT/ML ~~LOC~~ SOLN: 10.0000 [IU] | Freq: Three times a day (TID) | SUBCUTANEOUS | Status: DC

## 2013-08-31 MED ORDER — ONDANSETRON HCL 4 MG/2ML IJ SOLN: 4.0000 mg | Freq: Four times a day (QID) | INTRAMUSCULAR | Status: DC | PRN

## 2013-08-31 MED ORDER — SODIUM CHLORIDE 0.9 % IV SOLN
INTRAVENOUS | Status: DC
Start: 2013-09-01 — End: 2013-08-31

## 2013-08-31 MED ORDER — INSULIN ASPART 100 UNIT/ML ~~LOC~~ SOLN
10.0000 [IU] | Freq: Once | SUBCUTANEOUS | Status: AC
  Administered 2013-08-31: 10 [IU] via INTRAVENOUS
  Filled 2013-08-31: qty 1

## 2013-08-31 MED ORDER — INSULIN REGULAR BOLUS VIA INFUSION
0.0000 [IU] | Freq: Three times a day (TID) | INTRAVENOUS | Status: DC
  Filled 2013-08-31: qty 10

## 2013-08-31 MED ORDER — ONDANSETRON HCL 4 MG PO TABS
4.0000 mg | ORAL_TABLET | Freq: Four times a day (QID) | ORAL | Status: DC | PRN
Start: 2013-08-31 — End: 2013-09-01

## 2013-08-31 MED ORDER — ENOXAPARIN SODIUM 40 MG/0.4ML ~~LOC~~ SOLN: 40.0000 mg | SUBCUTANEOUS | Status: DC

## 2013-08-31 NOTE — ED Provider Notes (Signed)
CSN: [id number]     Arrival date & time 08/31/13  1909 History   First MD Initiated Contact with Patient 08/31/13 1929     Chief Complaint  Patient presents with  . Hyperglycemia     (Consider location/radiation/quality/duration/timing/severity/associated sxs/prior Treatment) Patient is a 68 y.o. female presenting with hyperglycemia. The history is provided by the patient.  Hyperglycemia Associated symptoms: no abdominal pain, no chest pain, no confusion, no dysuria, no fever, no nausea, no shortness of breath and no vomiting    patient transferred over from [facility]. She was admitted there on the 17th. Patient is a known diabetic. She was continued on her insulin regiment. Suggested a morning March 18 patient's blood sugars have essentially been in the 400 range. Patient otherwise is asymptomatic. Patient is now cooperative. Patient denies any chest pain shortness of breath or abdominal pain headache or fever. No dysuria.  Past Medical History  Diagnosis Date  . Anemia   . Diabetes mellitus without complication   . Hypertension   . Neuropathy   . CHF (congestive heart failure)   . Bipolar 1 disorder   . Depression   . Stroke    Past Surgical History  Procedure Laterality Date  . Cesarean section     History reviewed. No pertinent family history. History  Substance Use Topics  . Smoking status: Never Smoker   . Smokeless tobacco: Not on file  . Alcohol Use: No   OB History   Grav Para Term Preterm Abortions TAB SAB Ect Mult Living                 Review of Systems  Constitutional: Negative for fever.  HENT: Negative for congestion.   Eyes: Negative for visual disturbance.  Respiratory: Negative for shortness of breath.   Cardiovascular: Negative for chest pain.  Gastrointestinal: Negative for nausea, vomiting, abdominal pain and diarrhea.  Genitourinary: Negative for dysuria.  Musculoskeletal: Negative for back pain.  Skin: Negative for rash.   Neurological: Negative for headaches.  Hematological: Does not bruise/bleed easily.  Psychiatric/Behavioral: Positive for suicidal ideas. Negative for confusion.      Allergies  Peanut-containing drug products  Home Medications   Prior to Admission medications   Medication Sig Start Date End Date Taking? Authorizing Provider  albuterol (PROVENTIL HFA;VENTOLIN HFA) 108 (90 BASE) MCG/ACT inhaler Inhale 2 puffs into the lungs every 6 (six) hours as needed for wheezing.    Historical Provider, MD  allopurinol (ZYLOPRIM) 300 MG tablet Take 300 mg by mouth daily.    Historical Provider, MD  aspirin EC 81 MG tablet Take 81 mg by mouth daily.    Historical Provider, MD  atorvastatin (LIPITOR) 20 MG tablet Take 20 mg by mouth daily.    Historical Provider, MD  cloNIDine (CATAPRES - DOSED IN MG/24 HR) 0.3 mg/24hr patch Place 1 patch onto the skin once a week.    Historical Provider, MD  dexlansoprazole (DEXILANT) 60 MG capsule Take 60 mg by mouth daily.    Historical Provider, MD  ferrous sulfate 325 (65 FE) MG tablet Take 325 mg by mouth daily with breakfast.    Historical Provider, MD  furosemide (LASIX) 20 MG tablet Take 20 mg by mouth.    Historical Provider, MD  gabapentin (NEURONTIN) 300 MG capsule Take 300 mg by mouth 3 (three) times daily.    Historical Provider, MD  insulin glargine (LANTUS) 100 UNIT/ML injection Inject 10-80 Units into the skin 2 (two) times daily. 10 units in the  morning and 80 units at night    Historical Provider, MD  Liraglutide (VICTOZA) 18 MG/3ML SOPN Inject into the skin.    Historical Provider, MD  LORazepam (ATIVAN) 0.5 MG tablet Take 0.5 mg by mouth every 8 (eight) hours.    Historical Provider, MD  lurasidone (LATUDA) 40 MG TABS tablet Take 40 mg by mouth daily with breakfast.    Historical Provider, MD  metoprolol (TOPROL-XL) 200 MG 24 hr tablet Take 200 mg by mouth daily.    Historical Provider, MD  venlafaxine (EFFEXOR) 75 MG tablet Take 75 mg by mouth 2  (two) times daily.    Historical Provider, MD   BP 153/87  Pulse 74  Temp(Src) 98.3 F (36.8 C) (Oral)  Resp 20  Ht 5\' 4"  (1.626 m)  Wt 206 lb (93.441 kg)  BMI 35.34 kg/m2  SpO2 100% Physical Exam  Nursing note and vitals reviewed. Constitutional: She appears well-developed and well-nourished. No distress.  HENT:  Head: Normocephalic and atraumatic.  Eyes: Conjunctivae and EOM are normal.  Neck: Normal range of motion.  Cardiovascular: Normal rate, regular rhythm and normal heart sounds.   Pulmonary/Chest: Effort normal and breath sounds normal. No respiratory distress.  Abdominal: Soft. Bowel sounds are normal. There is no tenderness.  Musculoskeletal: Normal range of motion.  Neurological: She is alert. No cranial nerve deficit. She exhibits normal muscle tone. Coordination normal.  Patient is alert and cooperative.  Skin: Skin is warm. No rash noted.    ED Course  Procedures (including critical care time) Labs Review Labs Reviewed  GLUCOSE, CAPILLARY - Abnormal; Notable for the following:    Glucose-Capillary 270 (*)    All other components within normal limits  GLUCOSE, CAPILLARY - Abnormal; Notable for the following:    Glucose-Capillary 277 (*)    All other components within normal limits  GLUCOSE, CAPILLARY - Abnormal; Notable for the following:    Glucose-Capillary 125 (*)    All other components within normal limits  GLUCOSE, CAPILLARY - Abnormal; Notable for the following:    Glucose-Capillary 247 (*)    All other components within normal limits  GLUCOSE, CAPILLARY - Abnormal; Notable for the following:    Glucose-Capillary 395 (*)    All other components within normal limits  GLUCOSE, CAPILLARY - Abnormal; Notable for the following:    Glucose-Capillary 403 (*)    All other components within normal limits  GLUCOSE, CAPILLARY - Abnormal; Notable for the following:    Glucose-Capillary 192 (*)    All other components within normal limits  GLUCOSE,  CAPILLARY - Abnormal; Notable for the following:    Glucose-Capillary 306 (*)    All other components within normal limits  GLUCOSE, CAPILLARY - Abnormal; Notable for the following:    Glucose-Capillary 426 (*)    All other components within normal limits  GLUCOSE, CAPILLARY - Abnormal; Notable for the following:    Glucose-Capillary 486 (*)    All other components within normal limits  CBC WITH DIFFERENTIAL - Abnormal; Notable for the following:    RBC 3.71 (*)    Hemoglobin 9.3 (*)    HCT 29.1 (*)    MCH 25.1 (*)    RDW 19.9 (*)    Eosinophils Relative 8 (*)    All other components within normal limits  BASIC METABOLIC PANEL - Abnormal; Notable for the following:    Sodium 128 (*)    Chloride 95 (*)    Glucose, Bld 462 (*)    BUN 30 (*)  Creatinine, Ser 1.85 (*)    GFR calc non Af Amer 27 (*)    GFR calc Af Amer 32 (*)    All other components within normal limits  CBG MONITORING, ED - Abnormal; Notable for the following:    Glucose-Capillary 456 (*)    All other components within normal limits  PRO B NATRIURETIC PEPTIDE  CBG MONITORING, ED   Results for orders placed during the hospital encounter of 08/29/13  GLUCOSE, CAPILLARY      Result Value Ref Range   Glucose-Capillary 270 (*) 70 - 99 mg/dL   Comment 1 Documented in Chart     Comment 2 Notify RN    GLUCOSE, CAPILLARY      Result Value Ref Range   Glucose-Capillary 277 (*) 70 - 99 mg/dL   Comment 1 Notify RN     Comment 2 Documented in Chart    GLUCOSE, CAPILLARY      Result Value Ref Range   Glucose-Capillary 125 (*) 70 - 99 mg/dL  GLUCOSE, CAPILLARY      Result Value Ref Range   Glucose-Capillary 247 (*) 70 - 99 mg/dL   Comment 1 Notify RN    GLUCOSE, CAPILLARY      Result Value Ref Range   Glucose-Capillary 395 (*) 70 - 99 mg/dL  GLUCOSE, CAPILLARY      Result Value Ref Range   Glucose-Capillary 403 (*) 70 - 99 mg/dL   Comment 1 Notify RN    GLUCOSE, CAPILLARY      Result Value Ref Range    Glucose-Capillary 192 (*) 70 - 99 mg/dL  GLUCOSE, CAPILLARY      Result Value Ref Range   Glucose-Capillary 306 (*) 70 - 99 mg/dL  GLUCOSE, CAPILLARY      Result Value Ref Range   Glucose-Capillary 426 (*) 70 - 99 mg/dL  GLUCOSE, CAPILLARY      Result Value Ref Range   Glucose-Capillary 486 (*) 70 - 99 mg/dL  CBC WITH DIFFERENTIAL      Result Value Ref Range   WBC 7.1  4.0 - 10.5 K/uL   RBC 3.71 (*) 3.87 - 5.11 MIL/uL   Hemoglobin 9.3 (*) 12.0 - 15.0 g/dL   HCT 29.1 (*) 36.0 - 46.0 %   MCV 78.4  78.0 - 100.0 fL   MCH 25.1 (*) 26.0 - 34.0 pg   MCHC 32.0  30.0 - 36.0 g/dL   RDW 19.9 (*) 11.5 - 15.5 %   Platelets 233  150 - 400 K/uL   Neutrophils Relative % 59  43 - 77 %   Neutro Abs 4.2  1.7 - 7.7 K/uL   Lymphocytes Relative 25  12 - 46 %   Lymphs Abs 1.8  0.7 - 4.0 K/uL   Monocytes Relative 7  3 - 12 %   Monocytes Absolute 0.5  0.1 - 1.0 K/uL   Eosinophils Relative 8 (*) 0 - 5 %   Eosinophils Absolute 0.6  0.0 - 0.7 K/uL   Basophils Relative 0  0 - 1 %   Basophils Absolute 0.0  0.0 - 0.1 K/uL  BASIC METABOLIC PANEL      Result Value Ref Range   Sodium 128 (*) 137 - 147 mEq/L   Potassium 5.2  3.7 - 5.3 mEq/L   Chloride 95 (*) 96 - 112 mEq/L   CO2 24  19 - 32 mEq/L   Glucose, Bld 462 (*) 70 - 99 mg/dL   BUN 30 (*) 6 -  23 mg/dL   Creatinine, Ser 1.85 (*) 0.50 - 1.10 mg/dL   Calcium 8.9  8.4 - 10.5 mg/dL   GFR calc non Af Amer 27 (*) >90 mL/min   GFR calc Af Amer 32 (*) >90 mL/min  PRO B NATRIURETIC PEPTIDE      Result Value Ref Range   Pro B Natriuretic peptide (BNP) 102.0  0 - 125 pg/mL  CBG MONITORING, ED      Result Value Ref Range   Glucose-Capillary 456 (*) 70 - 99 mg/dL   Comment 1 Notify RN       Imaging Review Dg Chest Port 1 View  08/31/2013   CLINICAL DATA:  Weakness  EXAM: PORTABLE CHEST - 1 VIEW  COMPARISON:  None.  FINDINGS: Left basilar atelectasis. Right lung is clear. Normal heart size. No pneumothorax.  IMPRESSION: Left basilar atelectasis.    Electronically Signed   By: Maryclare Bean M.D.   On: 08/31/2013 21:15     EKG Interpretation None      CRITICAL CARE Performed by: Mervin Kung Total critical care time: 30 Critical care time was exclusive of separately billable procedures and treating other patients. Critical care was necessary to treat or prevent imminent or life-threatening deterioration. Critical care was time spent personally by me on the following activities: development of treatment plan with patient and/or surrogate as well as nursing, discussions with consultants, evaluation of patient's response to treatment, examination of patient, obtaining history from patient or surrogate, ordering and performing treatments and interventions, ordering and review of laboratory studies, ordering and review of radiographic studies, pulse oximetry and re-evaluation of patient's condition.    MDM   Final diagnoses:  Hyperglycemia    Patient was admitted to behavioral health on March 17 for suicidal ideation and bipolar and psychotic features. The patient is a known diabetic. Patient was continued on her usual amount of insulin there. However suggested a morning at 6 in the morning patient's blood sugars have essentially been in the 400 range. Patient's asymptomatic no nausea vomiting no diarrhea no chest pain no shortness of breath no belly pain. No fevers. Blood sugar here was 456. Patient given 10 units of IV insulin brought down in 356 range. Patient not given a lot of fluids because she has history CHF however BNP is only 1 or 2 that is comforting. Patient will be continued on IV fluids and was started on a glucose stabilizer. Patient will require medical admission to get her blood sugars under control before she can go back to the [facility]. Discussed with the hospitalist and temporary middle orders completed.  No evidence of DKA.  Mervin Kung, MD 08/31/13 [phone]

## 2013-08-31 NOTE — ED Notes (Signed)
Bed: WA67 Expected date: 08/31/13 Expected time: 6:57 PM Means of arrival:  Comments: Hyperglycemia 450 from [facility]

## 2013-08-31 NOTE — Progress Notes (Signed)
Pt s pre dinner CBG 426. PA notified and gave order to give pt novolog 22 units sq-given. Pt instructed to limit carbohydrate intake. 1 hr later cbg checked and 486. RN spoke with [facility] (EK RN ) and wills end pt to ED. Verbal report called to Precious Bard charge RN at [place], Dr. Maurie Boettcher MD called and order obtained to send pt to [facility] ED via 911. Pt notified and              Daughter present and she is aware. PD RN [facility]

## 2013-08-31 NOTE — Progress Notes (Signed)
Above note was reviewed. Concur with above assessment and plan.  Karter Haire P Shaneka Efaw, MD 

## 2013-08-31 NOTE — ED Notes (Signed)
MD aware of CBG

## 2013-08-31 NOTE — Progress Notes (Signed)
[facility] MD Progress Note  08/31/2013 8:45 AM Samantha [name]  MRN:  [id number] Subjective:  Met with patient who notes that she feels better than she has been feeling. She states she is better because she is not having racing thoughts, she slept well and no longer has SI/HI or AVH. She reports that she is learning a lot in the groups and that they are helping her a lot. 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 rate 18, height '5\' 4"'  (1.626 m), weight 93.441 kg (206 lb), SpO2 98.00%.Body mass index is 35.34 kg/(m^2).  General Appearance: Fairly Groomed  Engineer, water::  Good  Speech:  Clear and Coherent  Volume:  Normal  Mood:  Anxious and Depressed  Affect:  Congruent  Thought Process:  Goal Directed  Orientation:  Full (Time, Place, and Person)  Thought Content:  WDL  Suicidal Thoughts:  No  Homicidal Thoughts:  No  Memory:  NA  Judgement:  Fair  Insight:  Present  Psychomotor Activity:  Normal  Concentration:  Fair  Recall:  [place] of Knowledge:Good  Language: Good  Akathisia:  No  Handed:  Right  AIMS (if indicated):     Assets:  Communication Skills Desire for Improvement Financial Resources/Insurance Social Support  Sleep:  Number of Hours: 6.75   Musculoskeletal: Strength & Muscle Tone: within normal limits Gait & Station: normal Patient leans: N/A  Current Medications: Current Facility-Administered Medications  Medication Dose Route Frequency  Provider Last Rate Last Dose  . acetaminophen (TYLENOL) tablet 650 mg  650 mg Oral Q6H PRN Waylan Boga, NP      . albuterol (PROVENTIL HFA;VENTOLIN HFA) 108 (90 BASE) MCG/ACT inhaler 2 puff  2 puff Inhalation Q6H PRN Waylan Boga, NP      . allopurinol (ZYLOPRIM) tablet 300 mg  300 mg Oral Daily Waylan Boga, NP   300 mg at 08/30/13 0849  . alum & mag hydroxide-simeth (MAALOX/MYLANTA) 200-200-20 MG/5ML suspension 30 mL  30 mL Oral Q4H PRN Waylan Boga, NP   30 mL at 08/30/13 1956  . aspirin EC tablet 81 mg  81 mg Oral Daily Waylan Boga, NP   81 mg at 08/30/13 0849  . atorvastatin (LIPITOR) tablet 20 mg  20 mg Oral Daily Waylan Boga, NP   20 mg at 08/30/13 0849  . [START ON 09/05/2013] cloNIDine (CATAPRES - Dosed in mg/24 hr) patch 0.3 mg  0.3 mg Transdermal Weekly Waylan Boga, NP      . ferrous sulfate tablet 325 mg  325 mg Oral Q breakfast Waylan Boga, NP   325 mg at 08/30/13 0850  . furosemide (LASIX) tablet 20 mg  20 mg Oral Daily Waylan Boga, NP   20 mg at 08/30/13 0850  . gabapentin (NEURONTIN) capsule 300 mg  300 mg Oral TID Waylan Boga, NP   300 mg at 08/30/13 1705  . hydrOXYzine (ATARAX/VISTARIL) tablet 50 mg  50 mg Oral QHS PRN Lurena Nida, NP   50 mg at 08/30/13 2133  . insulin aspart (  novoLOG) injection 0-15 Units  0-15 Units Subcutaneous TID WC Waylan Boga, NP   3 Units at 08/31/13 [phone]  . insulin glargine (LANTUS) injection 10 Units  10 Units Subcutaneous Daily Waylan Boga, NP   10 Units at 08/30/13 0849  . insulin glargine (LANTUS) injection 80 Units  80 Units Subcutaneous QHS Waylan Boga, NP   80 Units at 08/30/13 2132  . LORazepam (ATIVAN) tablet 0.5 mg  0.5 mg Oral TID Waylan Boga, NP   0.5 mg at 08/30/13 1955  . lurasidone (LATUDA) tablet 40 mg  40 mg Oral Q breakfast Waylan Boga, NP   40 mg at 08/30/13 0849  . magnesium hydroxide (MILK OF MAGNESIA) suspension 30 mL  30 mL Oral Daily PRN Waylan Boga, NP      . metoprolol succinate (TOPROL-XL) 24 hr tablet 200 mg  200  mg Oral Q breakfast Waylan Boga, NP   200 mg at 08/30/13 0849  . pantoprazole (PROTONIX) EC tablet 40 mg  40 mg Oral Daily Waylan Boga, NP   40 mg at 08/30/13 0850  . venlafaxine [facility]) tablet 75 mg  75 mg Oral BID WC Waylan Boga, NP   75 mg at 08/30/13 1705    Lab Results:  Results for orders placed during the hospital encounter of 08/29/13 (from the past 48 hour(s))  GLUCOSE, CAPILLARY     Status: Abnormal   Collection Time    08/29/13  5:05 PM      Result Value Ref Range   Glucose-Capillary 270 (*) 70 - 99 mg/dL   Comment 1 Documented in Chart     Comment 2 Notify RN    GLUCOSE, CAPILLARY     Status: Abnormal   Collection Time    08/29/13  8:48 PM      Result Value Ref Range   Glucose-Capillary 277 (*) 70 - 99 mg/dL   Comment 1 Notify RN     Comment 2 Documented in Chart    GLUCOSE, CAPILLARY     Status: Abnormal   Collection Time    08/30/13  5:38 AM      Result Value Ref Range   Glucose-Capillary 125 (*) 70 - 99 mg/dL  GLUCOSE, CAPILLARY     Status: Abnormal   Collection Time    08/30/13 12:03 PM      Result Value Ref Range   Glucose-Capillary 247 (*) 70 - 99 mg/dL   Comment 1 Notify RN    GLUCOSE, CAPILLARY     Status: Abnormal   Collection Time    08/30/13  4:59 PM      Result Value Ref Range   Glucose-Capillary 395 (*) 70 - 99 mg/dL  GLUCOSE, CAPILLARY     Status: Abnormal   Collection Time    08/30/13  9:06 PM      Result Value Ref Range   Glucose-Capillary 403 (*) 70 - 99 mg/dL   Comment 1 Notify RN    GLUCOSE, CAPILLARY     Status: Abnormal   Collection Time    08/31/13  6:15 AM      Result Value Ref Range   Glucose-Capillary 192 (*) 70 - 99 mg/dL    Physical Findings: AIMS:  , ,  ,  ,    CIWA:    COWS:     Treatment Plan Summary: Daily contact with patient to assess and evaluate symptoms and progress in treatment Medication management  Plan: 1. Continue crisis management and stabilization. 2. Medication management to reduce current  symptoms to base line and improve the patient's overall level of functioning. 3. Treat health problems as indicated. 4. Develop treatment plan to decrease risk of relapse upon discharge and to reduce the need for readmission. 5. Psycho-social education regarding relapse prevention and self care. 6. Health care follow up as needed for medical problems. 7. Restart home medications where appropriate. 8. Will modify insulin dosage due to hyperglycemia. 9. Disposition in process. Medical Decision Making Problem Points:  Established problem, stable/improving (1) Data Points:  Review or order medicine tests (1)  I certify that inpatient services furnished can reasonably be expected to improve the patient's condition.  Marlane Hatcher. Tabb Croghan RPAC 10:33 AM 08/31/2013

## 2013-08-31 NOTE — ED Notes (Signed)
Pt presents from [facility] d/t hyperglycemia.  Pt is A&O x3, lethargic.

## 2013-08-31 NOTE — [facility] Group Notes (Signed)
[place] LCSW Group Therapy Note  08/31/2013 / 1:15 PM  Type of Therapy and Topic:  Group Therapy: Avoiding Self-Sabotaging and Enabling Behaviors  Participation Level:  Minimal   Mood: Flat, Depressed  Description of Group:     Learn how to identify obstacles, self-sabotaging and enabling behaviors, what are they, why do we do them and what needs do these behaviors meet? Discuss unhealthy relationships and how to have positive healthy boundaries with those that sabotage and enable. Explore aspects of self-sabotage and enabling in yourself and how to limit these self-destructive behaviors in everyday life.  Therapeutic Goals: 1. Patient will identify one obstacle that relates to self-sabotage and enabling behaviors 2. Patient will identify one personal self-sabotaging or enabling behavior they did prior to admission 3. Patient able to establish a plan to change the above identified behavior they did prior to admission:  4. Patient will demonstrate ability to communicate their needs through discussion and/or role plays.   Summary of Patient Progress: The main focus of today's process group was to explain what "self-sabotage" means and use Motivational Interviewing to discuss what benefits, negative or positive, were involved in a self-identified self-sabotaging behavior. We then talked about reasons the patient may want to change the behavior and her current desire to change.  Patient were asked to share with other group members one specific change they were willing to make at discharge in order to provide better self care. Mariane was quietly attentive during group session and shared only during the warmup. During warm up Clydette shared that she is looking forward to her son getting better this year.   Therapeutic Modalities:   Cognitive Behavioral Therapy Person-Centered Therapy Motivational Interviewing   Sheilah Pigeon, LCSW

## 2013-08-31 NOTE — Progress Notes (Signed)
Psychoeducational Group Note  Psychoeducational Group Note  Date: 08/31/2013 Time:  0930   Group Topic/Focus:  Gratefulness:  The focus of this group is to help patients identify what two things they are most grateful for in their lives. What helps ground them and to center them on their work to their recovery.  Participation Level:  Active  Participation Quality:  Appropriate  Affect:  Appropriate  Cognitive:  Oriented  Insight:  Improving  Engagement in Group:  Engaged  Additional Comments:  Pt. participated in the group.  Samantha [name]

## 2013-08-31 NOTE — Progress Notes (Signed)
Samantha [name] is seen OOB UAL on the 500 hal today...she tolerates this well. SHS ei squiet, but pleasant and cooperative with staff. She takes her medications as ordered and she keeps up with her cbg readings as well.   A She completes her daily self inventory and on it she writes she denies SI within the previous 24 hrs, she rates her depression and hopelessness "8/1", respectively and says her DC plan is to : " stop letting others tell me what to do...".   R Safety is maintained and poc is fostered with therapeutic relationship in place.

## 2013-08-31 NOTE — ED Notes (Signed)
456 is pt's CBG.

## 2013-08-31 NOTE — Progress Notes (Signed)
Psychoeducational Group Note  Date:  08/31/2013 Time:  1015  Group Topic/Focus:  Making Healthy Choices:   The focus of this group is to help patients identify negative/unhealthy choices they were using prior to admission and identify positive/healthier coping strategies to replace them upon discharge.  Participation Level:  Active  Participation Quality:  Appropriate  Affect:  Appropriate  Cognitive:  Oriented  Insight:  Improving  Engagement in Group:  Engaged  Additional Comments:  Attended the group and partisapated  Takerra Lupinacci A 08/31/2013  

## 2013-09-01 ENCOUNTER — Inpatient Hospital Stay (HOSPITAL_COMMUNITY)
Admission: AD | Admit: 2013-09-01 | Discharge: 2013-09-02 | DRG: 074 | Disposition: A | Discharge status: Nursing Home (Includes ICF) | Payer: Medicare Other | Source: Ambulatory Visit | Attending: Internal Medicine | Admitting: Internal Medicine

## 2013-09-01 ENCOUNTER — Encounter (HOSPITAL_COMMUNITY): Payer: Self-pay

## 2013-09-01 DIAGNOSIS — F319 Bipolar disorder, unspecified: Secondary | ICD-10-CM

## 2013-09-01 DIAGNOSIS — Z79899 Other long term (current) drug therapy: Secondary | ICD-10-CM

## 2013-09-01 DIAGNOSIS — I10 Essential (primary) hypertension: Secondary | ICD-10-CM | POA: Diagnosis present

## 2013-09-01 DIAGNOSIS — I509 Heart failure, unspecified: Secondary | ICD-10-CM | POA: Diagnosis present

## 2013-09-01 DIAGNOSIS — E1165 Type 2 diabetes mellitus with hyperglycemia: Secondary | ICD-10-CM

## 2013-09-01 DIAGNOSIS — R739 Hyperglycemia, unspecified: Secondary | ICD-10-CM | POA: Diagnosis present

## 2013-09-01 DIAGNOSIS — Z794 Long term (current) use of insulin: Secondary | ICD-10-CM

## 2013-09-01 DIAGNOSIS — Z6837 Body mass index (BMI) 37.0-37.9, adult: Secondary | ICD-10-CM

## 2013-09-01 DIAGNOSIS — Z5987 Material hardship due to limited financial resources, not elsewhere classified: Secondary | ICD-10-CM

## 2013-09-01 DIAGNOSIS — Z598 Other problems related to housing and economic circumstances: Secondary | ICD-10-CM

## 2013-09-01 DIAGNOSIS — E1142 Type 2 diabetes mellitus with diabetic polyneuropathy: Secondary | ICD-10-CM | POA: Diagnosis present

## 2013-09-01 DIAGNOSIS — F3160 Bipolar disorder, current episode mixed, unspecified: Secondary | ICD-10-CM

## 2013-09-01 DIAGNOSIS — D500 Iron deficiency anemia secondary to blood loss (chronic): Secondary | ICD-10-CM | POA: Diagnosis present

## 2013-09-01 DIAGNOSIS — J9819 Other pulmonary collapse: Secondary | ICD-10-CM | POA: Diagnosis present

## 2013-09-01 DIAGNOSIS — IMO0001 Reserved for inherently not codable concepts without codable children: Secondary | ICD-10-CM

## 2013-09-01 DIAGNOSIS — R45851 Suicidal ideations: Secondary | ICD-10-CM

## 2013-09-01 DIAGNOSIS — E1149 Type 2 diabetes mellitus with other diabetic neurological complication: Principal | ICD-10-CM | POA: Diagnosis present

## 2013-09-01 DIAGNOSIS — R7309 Other abnormal glucose: Secondary | ICD-10-CM

## 2013-09-01 DIAGNOSIS — F3130 Bipolar disorder, current episode depressed, mild or moderate severity, unspecified: Secondary | ICD-10-CM

## 2013-09-01 DIAGNOSIS — N179 Acute kidney failure, unspecified: Secondary | ICD-10-CM | POA: Diagnosis present

## 2013-09-01 DIAGNOSIS — F29 Unspecified psychosis not due to a substance or known physiological condition: Secondary | ICD-10-CM

## 2013-09-01 DIAGNOSIS — D62 Acute posthemorrhagic anemia: Secondary | ICD-10-CM | POA: Diagnosis present

## 2013-09-01 DIAGNOSIS — Z8673 Personal history of transient ischemic attack (TIA), and cerebral infarction without residual deficits: Secondary | ICD-10-CM

## 2013-09-01 DIAGNOSIS — Z7982 Long term (current) use of aspirin: Secondary | ICD-10-CM

## 2013-09-01 DIAGNOSIS — E785 Hyperlipidemia, unspecified: Secondary | ICD-10-CM | POA: Diagnosis present

## 2013-09-01 LAB — GLUCOSE, CAPILLARY
GLUCOSE-CAPILLARY: 180 mg/dL — AB (ref 70–99)
GLUCOSE-CAPILLARY: 153 mg/dL — AB (ref 70–99)
GLUCOSE-CAPILLARY: 123 mg/dL — AB (ref 70–99)
GLUCOSE-CAPILLARY: 246 mg/dL — AB (ref 70–99)
GLUCOSE-CAPILLARY: 197 mg/dL — AB (ref 70–99)
GLUCOSE-CAPILLARY: 165 mg/dL — AB (ref 70–99)
Glucose-Capillary: 121 mg/dL — ABNORMAL HIGH (ref 70–99)
Glucose-Capillary: 134 mg/dL — ABNORMAL HIGH (ref 70–99)
Glucose-Capillary: 173 mg/dL — ABNORMAL HIGH (ref 70–99)
Glucose-Capillary: 212 mg/dL — ABNORMAL HIGH (ref 70–99)
Glucose-Capillary: 226 mg/dL — ABNORMAL HIGH (ref 70–99)
Glucose-Capillary: 227 mg/dL — ABNORMAL HIGH (ref 70–99)
Glucose-Capillary: 241 mg/dL — ABNORMAL HIGH (ref 70–99)
Glucose-Capillary: 249 mg/dL — ABNORMAL HIGH (ref 70–99)
Glucose-Capillary: 253 mg/dL — ABNORMAL HIGH (ref 70–99)

## 2013-09-01 LAB — BASIC METABOLIC PANEL
BUN: 27 mg/dL — AB (ref 6–23)
CALCIUM: 9 mg/dL (ref 8.4–10.5)
CO2: 24 mEq/L (ref 19–32)
CREATININE: 1.59 mg/dL — AB (ref 0.50–1.10)
Chloride: 104 mEq/L (ref 96–112)
GFR calc Af Amer: 38 mL/min — ABNORMAL LOW (ref >90)
GFR, EST NON AFRICAN AMERICAN: 33 mL/min — AB (ref >90)
Glucose, Bld: 189 mg/dL — ABNORMAL HIGH (ref 70–99)
Potassium: 4.6 mEq/L (ref 3.7–5.3)
Sodium: 137 mEq/L (ref 137–147)

## 2013-09-01 LAB — URINALYSIS, ROUTINE W REFLEX MICROSCOPIC
Bilirubin Urine: NEGATIVE
Glucose, UA: 250 mg/dL — AB
Hgb urine dipstick: NEGATIVE
KETONES UR: NEGATIVE mg/dL
LEUKOCYTES UA: NEGATIVE
Nitrite: NEGATIVE
PROTEIN: NEGATIVE mg/dL
Specific Gravity, Urine: 1.006 (ref 1.005–1.030)
Urobilinogen, UA: 0.2 mg/dL (ref 0.0–1.0)
pH: 6 (ref 5.0–8.0)

## 2013-09-01 LAB — CBC
HCT: 29.3 % — ABNORMAL LOW (ref 36.0–46.0)
Hemoglobin: 9.3 g/dL — ABNORMAL LOW (ref 12.0–15.0)
MCH: 24.9 pg — ABNORMAL LOW (ref 26.0–34.0)
MCHC: 31.7 g/dL (ref 30.0–36.0)
MCV: 78.3 fL (ref 78.0–100.0)
Platelets: 243 10*3/uL (ref 150–400)
RBC: 3.74 MIL/uL — ABNORMAL LOW (ref 3.87–5.11)
RDW: 20 % — AB (ref 11.5–15.5)
WBC: 7.6 10*3/uL (ref 4.0–10.5)

## 2013-09-01 LAB — HEMOGLOBIN A1C
HEMOGLOBIN A1C: 9.1 % — AB (ref <5.7)
Mean Plasma Glucose: 214 mg/dL — ABNORMAL HIGH (ref <117)

## 2013-09-01 MED ORDER — LURASIDONE HCL 40 MG PO TABS
40.0000 mg | ORAL_TABLET | Freq: Every day | ORAL | Status: DC
  Administered 2013-09-01 – 2013-09-02 (×2): 40 mg via ORAL
  Filled 2013-09-01 (×3): qty 1

## 2013-09-01 MED ORDER — ASPIRIN EC 81 MG PO TBEC
81.0000 mg | DELAYED_RELEASE_TABLET | Freq: Every day | ORAL | Status: DC
  Administered 2013-09-01 – 2013-09-02 (×2): 81 mg via ORAL
  Filled 2013-09-01 (×2): qty 1

## 2013-09-01 MED ORDER — INSULIN REGULAR BOLUS VIA INFUSION
0.0000 [IU] | Freq: Three times a day (TID) | INTRAVENOUS | Status: DC
  Filled 2013-09-01: qty 10

## 2013-09-01 MED ORDER — SODIUM CHLORIDE 0.9 % IV SOLN
INTRAVENOUS | Status: DC
  Administered 2013-09-02: 08:00:00 via INTRAVENOUS

## 2013-09-01 MED ORDER — CLONIDINE HCL 0.3 MG/24HR TD PTWK
0.3000 mg | MEDICATED_PATCH | TRANSDERMAL | Status: DC
  Filled 2013-09-01: qty 1

## 2013-09-01 MED ORDER — INSULIN REGULAR HUMAN 100 UNIT/ML IJ SOLN
INTRAMUSCULAR | Status: DC
  Filled 2013-09-01: qty 1

## 2013-09-01 MED ORDER — INSULIN GLARGINE 100 UNIT/ML ~~LOC~~ SOLN
50.0000 [IU] | Freq: Every day | SUBCUTANEOUS | Status: DC
  Administered 2013-09-01 – 2013-09-02 (×2): 50 [IU] via SUBCUTANEOUS
  Filled 2013-09-01 (×5): qty 0.5

## 2013-09-01 MED ORDER — PANTOPRAZOLE SODIUM 40 MG PO TBEC
40.0000 mg | DELAYED_RELEASE_TABLET | Freq: Every day | ORAL | Status: DC
  Administered 2013-09-01 – 2013-09-02 (×2): 40 mg via ORAL
  Filled 2013-09-01 (×2): qty 1

## 2013-09-01 MED ORDER — INSULIN ASPART 100 UNIT/ML ~~LOC~~ SOLN
6.0000 [IU] | Freq: Three times a day (TID) | SUBCUTANEOUS | Status: DC
  Administered 2013-09-01 – 2013-09-02 (×4): 6 [IU] via SUBCUTANEOUS

## 2013-09-01 MED ORDER — INSULIN ASPART 100 UNIT/ML ~~LOC~~ SOLN
0.0000 [IU] | Freq: Three times a day (TID) | SUBCUTANEOUS | Status: DC
  Administered 2013-09-01: 5 [IU] via SUBCUTANEOUS
  Administered 2013-09-01: 3 [IU] via SUBCUTANEOUS
  Administered 2013-09-02: 5 [IU] via SUBCUTANEOUS
  Administered 2013-09-02: 3 [IU] via SUBCUTANEOUS

## 2013-09-01 MED ORDER — ATORVASTATIN CALCIUM 20 MG PO TABS
20.0000 mg | ORAL_TABLET | Freq: Every day | ORAL | Status: DC
  Administered 2013-09-01: 20 mg via ORAL
  Filled 2013-09-01 (×2): qty 1

## 2013-09-01 MED ORDER — ALBUTEROL SULFATE (2.5 MG/3ML) 0.083% IN NEBU: 2.5000 mg | INHALATION_SOLUTION | Freq: Four times a day (QID) | RESPIRATORY_TRACT | Status: DC | PRN

## 2013-09-01 MED ORDER — ONDANSETRON HCL 4 MG PO TABS: 4.0000 mg | ORAL_TABLET | Freq: Four times a day (QID) | ORAL | Status: DC | PRN

## 2013-09-01 MED ORDER — ONDANSETRON HCL 4 MG/2ML IJ SOLN: 4.0000 mg | Freq: Four times a day (QID) | INTRAMUSCULAR | Status: DC | PRN

## 2013-09-01 MED ORDER — FERROUS SULFATE 325 (65 FE) MG PO TABS
325.0000 mg | ORAL_TABLET | Freq: Every day | ORAL | Status: DC
  Administered 2013-09-01 – 2013-09-02 (×2): 325 mg via ORAL
  Filled 2013-09-01 (×3): qty 1

## 2013-09-01 MED ORDER — ALLOPURINOL 300 MG PO TABS
300.0000 mg | ORAL_TABLET | Freq: Every day | ORAL | Status: DC
  Administered 2013-09-01 – 2013-09-02 (×2): 300 mg via ORAL
  Filled 2013-09-01 (×2): qty 1

## 2013-09-01 MED ORDER — LIVING WELL WITH DIABETES BOOK
Freq: Once | Status: AC
  Administered 2013-09-01: 14:00:00
  Filled 2013-09-01: qty 1

## 2013-09-01 MED ORDER — INSULIN ASPART 100 UNIT/ML ~~LOC~~ SOLN: 0.0000 [IU] | Freq: Every day | SUBCUTANEOUS | Status: DC

## 2013-09-01 MED ORDER — DEXTROSE 50 % IV SOLN: 25.0000 mL | INTRAVENOUS | Status: DC | PRN

## 2013-09-01 MED ORDER — SODIUM CHLORIDE 0.45 % IV SOLN
INTRAVENOUS | Status: DC
  Administered 2013-09-01: 01:00:00 via INTRAVENOUS

## 2013-09-01 MED ORDER — GABAPENTIN 300 MG PO CAPS
300.0000 mg | ORAL_CAPSULE | Freq: Three times a day (TID) | ORAL | Status: DC
  Administered 2013-09-01 – 2013-09-02 (×5): 300 mg via ORAL
  Filled 2013-09-01 (×7): qty 1

## 2013-09-01 MED ORDER — METOPROLOL SUCCINATE ER 100 MG PO TB24
200.0000 mg | ORAL_TABLET | Freq: Every day | ORAL | Status: DC
  Administered 2013-09-01 – 2013-09-02 (×2): 200 mg via ORAL
  Filled 2013-09-01 (×2): qty 2

## 2013-09-01 MED ORDER — VENLAFAXINE HCL 75 MG PO TABS
75.0000 mg | ORAL_TABLET | Freq: Two times a day (BID) | ORAL | Status: DC
  Administered 2013-09-01 – 2013-09-02 (×4): 75 mg via ORAL
  Filled 2013-09-01 (×5): qty 1

## 2013-09-01 MED ORDER — ENOXAPARIN SODIUM 40 MG/0.4ML ~~LOC~~ SOLN
40.0000 mg | SUBCUTANEOUS | Status: DC
  Administered 2013-09-01 – 2013-09-02 (×2): 40 mg via SUBCUTANEOUS
  Filled 2013-09-01 (×2): qty 0.4

## 2013-09-01 MED ORDER — BUPIVACAINE 0.25 % ON-Q PUMP DUAL CATH 400 ML: 400.0000 mL | INJECTION | Status: DC

## 2013-09-01 MED ORDER — LORAZEPAM 0.5 MG PO TABS
0.5000 mg | ORAL_TABLET | Freq: Three times a day (TID) | ORAL | Status: DC
  Administered 2013-09-01 – 2013-09-02 (×6): 0.5 mg via ORAL
  Filled 2013-09-01 (×6): qty 1

## 2013-09-01 MED ORDER — INSULIN GLARGINE 100 UNIT/ML ~~LOC~~ SOLN: 20.0000 [IU] | Freq: Two times a day (BID) | SUBCUTANEOUS | Status: DC

## 2013-09-01 MED ORDER — ALBUTEROL SULFATE HFA 108 (90 BASE) MCG/ACT IN AERS: 2.0000 | INHALATION_SPRAY | Freq: Four times a day (QID) | RESPIRATORY_TRACT | Status: DC | PRN

## 2013-09-01 NOTE — Evaluation (Signed)
Physical Therapy Evaluation Patient Details Name: Samantha [name] MRN: [id number] DOB: [birth year] Today's Date: 09/01/2013   History of Present Illness  Patient is a 68 y.o. Female with DM, bipolar disorder, presenting from [facility] for evaluation and management of hyperglycemia, CBG > 400. She was admitted to [facility] on 08/29/2013 and was doing well except having persistently high CBG's. She denies chest pain, shortness of breath, no specific abd or urinary concerns, no fevers,c hills.  Clinical Impression  Pt  Is unsteady but pt reports that she needs her glasses. No family present to discuss DC laln and if pt has 24/7 close supervision. Pt also reports that her bedroom is on second floor which could be unsafe. There againe no family to collabotrate. Pt will benefit from PT to address problems listed in chart.    Follow Up Recommendations Supervision - Intermittent;Home health PT (if has caregivers) or SNF if no caregivers.    Equipment Recommendations  Rolling walker with 5" wheels    Recommendations for Other Services       Precautions / Restrictions Precautions Precautions: Fall Restrictions Weight Bearing Restrictions: No      Mobility  Bed Mobility Overal bed mobility: Independent                Transfers Overall transfer level: Needs assistance Equipment used: Rolling walker (2 wheeled) Transfers: Sit to/from Stand           General transfer comment: min guard without RW  Ambulation/Gait Ambulation/Gait assistance: Supervision;Min guard Ambulation Distance (Feet): 100 Feet Assistive device: Rolling walker (2 wheeled)   Gait velocity: guidance for RW in room and around objects. ? visual deficits. Pt states she needs her glasses.   General Gait Details: pt did walk in room without AD, tends to hold to objects  Stairs            Wheelchair Mobility    Modified Rankin (Stroke Patients Only)       Balance Overall balance assessment: Needs assistance          Standing balance support: No upper extremity supported;During functional activity Standing balance-Leahy Scale: Poor                               Pertinent Vitals/Pain     Home Living Family/patient expects to be discharged to:: Private residence Living Arrangements: Children Available Help at Discharge: Family Type of Home: House       Home Layout: Two level;Bed/bath upstairs Home Equipment: Kasandra Knudsen - single point      Prior Function Level of Independence: Independent with assistive device(s)               Hand Dominance        Extremity/Trunk Assessment   Upper Extremity Assessment: Defer to OT evaluation           Lower Extremity Assessment: LLE deficits/detail;RLE deficits/detail;Generalized weakness         Communication   Communication: No difficulties  Cognition Arousal/Alertness: Awake/alert Behavior During Therapy: WFL for tasks assessed/performed Overall Cognitive Status: Within Functional Limits for tasks assessed (all orientation correct except stated 21st)                      General Comments      Exercises        Assessment/Plan    PT Assessment Patient needs continued PT services  PT Diagnosis Difficulty walking   PT Problem  List Decreased balance;Decreased safety awareness;Decreased knowledge of use of DME  PT Treatment Interventions DME instruction;Gait training;Stair training;Functional mobility training;Therapeutic activities;Therapeutic exercise;Balance training;Patient/family education   PT Goals (Current goals can be found in the Care Plan section) Acute Rehab PT Goals Patient Stated Goal: I wnat to go home PT Goal Formulation: With patient Time For Goal Achievement: 09/15/13 Potential to Achieve Goals: Good    Frequency Min 3X/week   Barriers to discharge        Co-evaluation               End of Session Equipment Utilized During Treatment: Gait belt Activity Tolerance:  Patient tolerated treatment well Patient left: in bed;with call bell/phone within reach;with bed alarm set Nurse Communication: Mobility status         Time: 3244-0102 PT Time Calculation (min): 23 min   Charges:   PT Evaluation $Initial PT Evaluation Tier I: 1 Procedure PT Treatments $Gait Training: 23-37 mins   PT G Codes:          Claretha Cooper 09/01/2013, 12:24 PM Tresa Endo PT [phone]

## 2013-09-01 NOTE — Care Management Note (Addendum)
CARE MANAGEMENT NOTE 09/01/2013  Patient:  Samantha [name], Samantha [name]   Account Number:  [id number]  Date Initiated:  09/01/2013  Documentation initiated by:  Burhanuddin Kohlmann  Subjective/Objective Assessment:   68 yo female admitted hyperglycemia.     Action/Plan:   Discharged when stable   Anticipated DC Date:     Anticipated DC Plan:  [place]  CM consult      Choice offered to / List presented to:  NA   DME arranged  NA      DME agency  NA     [place] arranged  NA      [place] agency  NA   Status of service:  Completed, signed off Medicare Important Message given?   (If response is "NO", the following Medicare IM given date fields will be blank) Date Medicare IM given:   Date Additional Medicare IM given:    Discharge Disposition:    Per UR Regulation:  Reviewed for med. necessity/level of care/duration of stay  If discussed at [place] of Stay Meetings, dates discussed:    Comments:  09/01/13 1127 Anastasha Ortez,MSN,RN [phone] Chart reviewed for utilizationof services. Pt recently discharged from [facility]. PT/OT eval for further dc planning.  09/02/13 [place] [phone] Cm spoke with patient's adult daughters concerning discharge planning. Per pt's daughters, no sufficient supervision for mother. Pt disposition to ALF@ [place]. CSW following.

## 2013-09-01 NOTE — Consult Note (Addendum)
[facility] Face-to-Face Psychiatry Consult   Reason for Consult:  Suicidal Ideation Referring Physician:  [name] is an 68 y.o. female. Total Time spent with patient: 20 minutes  Assessment: AXIS I:  Bipolar, mixed AXIS II:  Deferred AXIS III:   Past Medical History  Diagnosis Date  . Anemia   . Diabetes mellitus without complication   . Hypertension   . Neuropathy   . CHF (congestive heart failure)   . Bipolar 1 disorder   . Depression   . Stroke    AXIS IV:  economic problems, housing problems, other psychosocial or environmental problems, problems related to social environment and problems with primary support group AXIS V:  51-60 moderate symptoms  Plan:  No evidence of imminent risk to self or others at present.   Patient does not meet criteria for psychiatric inpatient admission. Supportive therapy provided about ongoing stressors. Discussed crisis plan, support from social network, calling 911, coming to the Emergency Department, and calling Suicide Hotline.  Subjective:   Samantha [name] is a 68 y.o. female patient admitted with hyperglycemia.  HPI:  The patient seen and chart reviewed.  The patient is 68 year old widowed African American female who was initially admitted to behavioral [facility] because of severe depression and having suicidal thoughts however due to persistent hyperglycemia she was admitted on the medical floor.  Consult was called because patient mentioned suicidal thoughts.  Patient endorsed that she was admitted to behavioral [facility] because she was feeling very sad depressed and disappointed from her daughter.  She was living in [place] and after she find out that her house is not able to in her living condition she decided to move to live with her daughter and son.  Patient was disappointed when she find out that no one is happy.  Her son has history of schizophrenia and he has recently trouble with the law.   The patient was hoping that daughter will help her.  However she was told to live in assisted living facility and patient become more sad depressed and decided to having suicidal thoughts.  She admitted her thoughts are so bad that she wanted to cut her arms but she decided to get help.  She is taking  Latuda and Effexor.  The patient believes her current medicine is working very well.  She is also believed that her daughter is now willing to take her home .  She is pleased with the decision.  Patient denies any suicidal thinking, paranoia, hallucination or any depressive symptoms.  She is seeing psychiatrist as an outpatient and does not want to change her medication.  She denies any irritability, anger, mood swing.  She sleeping better.  She has no tremors or shakes.  She was to continue her current psychotropic medication and want to followup upon discharge to her psychiatrist.  Patient denies any suicidal thoughts, homicidal thought, feeling of hopelessness or worthlessness.    Past Psychiatric History: Past Medical History  Diagnosis Date  . Anemia   . Diabetes mellitus without complication   . Hypertension   . Neuropathy   . CHF (congestive heart failure)   . Bipolar 1 disorder   . Depression   . Stroke     reports that she has never smoked. She does not have any smokeless tobacco history on file. She reports that she does not drink alcohol. Her drug history is not on file. History reviewed. No pertinent family history.   Living  Arrangements: Children   Abuse/Neglect [facility]) Physical Abuse: Denies Verbal Abuse: Denies Sexual Abuse: Denies Allergies:   Allergies  Allergen Reactions  . Peanut-Containing Drug Products     ACT Assessment Complete:  Yes:    Educational Status    Risk to Self: Risk to self Is patient at risk for suicide?: Yes Substance abuse history and/or treatment for substance abuse?: No  Risk to Others:    Abuse: Abuse/Neglect Assessment (Assessment to be  complete while patient is alone) Physical Abuse: Denies Verbal Abuse: Denies Sexual Abuse: Denies Exploitation of patient/patient's resources: Denies Self-Neglect: Denies  Prior Inpatient Therapy:    Prior Outpatient Therapy:    Additional Information:                    Objective: Blood pressure 154/87, pulse 55, temperature 98.4 F (36.9 C), temperature source Oral, resp. rate 20, height '5\' 4"'  (1.626 m), weight 218 lb 0.6 oz (98.9 kg), SpO2 100.00%.Body mass index is 37.41 kg/(m^2). Results for orders placed during the hospital encounter of 09/01/13 (from the past 72 hour(s))  GLUCOSE, CAPILLARY     Status: Abnormal   Collection Time    08/31/13 11:49 PM      Result Value Ref Range   Glucose-Capillary 253 (*) 70 - 99 mg/dL  GLUCOSE, CAPILLARY     Status: Abnormal   Collection Time    09/01/13 12:30 AM      Result Value Ref Range   Glucose-Capillary 249 (*) 70 - 99 mg/dL  GLUCOSE, CAPILLARY     Status: Abnormal   Collection Time    09/01/13  1:42 AM      Result Value Ref Range   Glucose-Capillary 227 (*) 70 - 99 mg/dL  GLUCOSE, CAPILLARY     Status: Abnormal   Collection Time    09/01/13  2:43 AM      Result Value Ref Range   Glucose-Capillary 212 (*) 70 - 99 mg/dL  GLUCOSE, CAPILLARY     Status: Abnormal   Collection Time    09/01/13  3:42 AM      Result Value Ref Range   Glucose-Capillary 180 (*) 70 - 99 mg/dL  BASIC METABOLIC PANEL     Status: Abnormal   Collection Time    09/01/13  3:45 AM      Result Value Ref Range   Sodium 137  137 - 147 mEq/L   Comment: DELTA CHECK NOTED     REPEATED TO VERIFY   Potassium 4.6  3.7 - 5.3 mEq/L   Chloride 104  96 - 112 mEq/L   Comment: DELTA CHECK NOTED     REPEATED TO VERIFY   CO2 24  19 - 32 mEq/L   Glucose, Bld 189 (*) 70 - 99 mg/dL   BUN 27 (*) 6 - 23 mg/dL   Creatinine, Ser 1.59 (*) 0.50 - 1.10 mg/dL   Calcium 9.0  8.4 - 10.5 mg/dL   GFR calc non Af Amer 33 (*) >90 mL/min   GFR calc Af Amer 38 (*) >90  mL/min   Comment: (NOTE)     The eGFR has been calculated using the CKD EPI equation.     This calculation has not been validated in all clinical situations.     eGFR's persistently <90 mL/min signify possible Chronic Kidney     Disease.  CBC     Status: Abnormal   Collection Time    09/01/13  3:45 AM      Result  Value Ref Range   WBC 7.6  4.0 - 10.5 K/uL   RBC 3.74 (*) 3.87 - 5.11 MIL/uL   Hemoglobin 9.3 (*) 12.0 - 15.0 g/dL   HCT 29.3 (*) 36.0 - 46.0 %   MCV 78.3  78.0 - 100.0 fL   MCH 24.9 (*) 26.0 - 34.0 pg   MCHC 31.7  30.0 - 36.0 g/dL   RDW 20.0 (*) 11.5 - 15.5 %   Platelets 243  150 - 400 K/uL  HEMOGLOBIN A1C     Status: Abnormal   Collection Time    09/01/13  3:45 AM      Result Value Ref Range   Hemoglobin A1C 9.1 (*) <5.7 %   Comment: (NOTE)                                                                               According to the ADA Clinical Practice Recommendations for 2011, when     HbA1c is used as a screening test:      >=6.5%   Diagnostic of Diabetes Mellitus               (if abnormal result is confirmed)     5.7-6.4%   Increased risk of developing Diabetes Mellitus     References:Diagnosis and Classification of Diabetes Mellitus,Diabetes     KCLE,7517,00(FVCBS 1):S62-S69 and Standards of Medical Care in             Diabetes - 2011,Diabetes Care,2011,34 (Suppl 1):S11-S61.   Mean Plasma Glucose 214 (*) <117 mg/dL   Comment: Performed at [facility], ROUTINE W REFLEX MICROSCOPIC     Status: Abnormal   Collection Time    09/01/13  3:54 AM      Result Value Ref Range   Color, Urine YELLOW  YELLOW   APPearance CLEAR  CLEAR   Specific Gravity, Urine 1.006  1.005 - 1.030   pH 6.0  5.0 - 8.0   Glucose, UA 250 (*) NEGATIVE mg/dL   Hgb urine dipstick NEGATIVE  NEGATIVE   Bilirubin Urine NEGATIVE  NEGATIVE   Ketones, ur NEGATIVE  NEGATIVE mg/dL   Protein, ur NEGATIVE  NEGATIVE mg/dL   Urobilinogen, UA 0.2  0.0 - 1.0 mg/dL   Nitrite  NEGATIVE  NEGATIVE   Leukocytes, UA NEGATIVE  NEGATIVE   Comment: MICROSCOPIC NOT DONE ON URINES WITH NEGATIVE PROTEIN, BLOOD, LEUKOCYTES, NITRITE, OR GLUCOSE <1000 mg/dL.  GLUCOSE, CAPILLARY     Status: Abnormal   Collection Time    09/01/13  4:42 AM      Result Value Ref Range   Glucose-Capillary 153 (*) 70 - 99 mg/dL  GLUCOSE, CAPILLARY     Status: Abnormal   Collection Time    09/01/13  5:43 AM      Result Value Ref Range   Glucose-Capillary 134 (*) 70 - 99 mg/dL  GLUCOSE, CAPILLARY     Status: Abnormal   Collection Time    09/01/13  6:46 AM      Result Value Ref Range   Glucose-Capillary 121 (*) 70 - 99 mg/dL  GLUCOSE, CAPILLARY     Status: Abnormal   Collection Time    09/01/13  7:35 AM      Result Value Ref Range   Glucose-Capillary 123 (*) 70 - 99 mg/dL   Comment 1 Documented in Chart     Comment 2 Notify RN    GLUCOSE, CAPILLARY     Status: Abnormal   Collection Time    09/01/13  8:54 AM      Result Value Ref Range   Glucose-Capillary 246 (*) 70 - 99 mg/dL  GLUCOSE, CAPILLARY     Status: Abnormal   Collection Time    09/01/13 10:04 AM      Result Value Ref Range   Glucose-Capillary 226 (*) 70 - 99 mg/dL  GLUCOSE, CAPILLARY     Status: Abnormal   Collection Time    09/01/13 11:10 AM      Result Value Ref Range   Glucose-Capillary 197 (*) 70 - 99 mg/dL   Labs are reviewed and are pertinent for hyperglycemia.  Current Facility-Administered Medications  Medication Dose Route Frequency Provider Last Rate Last Dose  . 0.9 %  sodium chloride infusion   Intravenous Continuous Theodis Blaze, MD      . albuterol (PROVENTIL) (2.5 MG/3ML) 0.083% nebulizer solution 2.5 mg  2.5 mg Nebulization Q6H PRN Theodis Blaze, MD      . allopurinol (ZYLOPRIM) tablet 300 mg  300 mg Oral Daily Theodis Blaze, MD   300 mg at 09/01/13 1019  . aspirin EC tablet 81 mg  81 mg Oral Daily Theodis Blaze, MD   81 mg at 09/01/13 1020  . atorvastatin (LIPITOR) tablet 20 mg  20 mg Oral q1800 Theodis Blaze, MD      . cloNIDine (CATAPRES - Dosed in mg/24 hr) patch 0.3 mg  0.3 mg Transdermal Weekly Theodis Blaze, MD      . dextrose 50 % solution 25 mL  25 mL Intravenous PRN Theodis Blaze, MD      . enoxaparin (LOVENOX) injection 40 mg  40 mg Subcutaneous Q24H Theodis Blaze, MD   40 mg at 09/01/13 1020  . ferrous sulfate tablet 325 mg  325 mg Oral Q breakfast Theodis Blaze, MD   325 mg at 09/01/13 0849  . gabapentin (NEURONTIN) capsule 300 mg  300 mg Oral TID Theodis Blaze, MD   300 mg at 09/01/13 1020  . insulin aspart (novoLOG) injection 0-15 Units  0-15 Units Subcutaneous TID WC Theodis Blaze, MD   3 Units at 09/01/13 1117  . insulin aspart (novoLOG) injection 0-5 Units  0-5 Units Subcutaneous QHS Theodis Blaze, MD      . insulin aspart (novoLOG) injection 6 Units  6 Units Subcutaneous TID WC Theodis Blaze, MD   6 Units at 09/01/13 1301  . insulin glargine (LANTUS) injection 50 Units  50 Units Subcutaneous Daily Dianne Dun, NP   50 Units at 09/01/13 0849  . living well with diabetes book MISC   Does not apply Once Theodis Blaze, MD      . LORazepam (ATIVAN) tablet 0.5 mg  0.5 mg Oral 3 times per day Theodis Blaze, MD   0.5 mg at 09/01/13 1423  . lurasidone (LATUDA) tablet 40 mg  40 mg Oral Q breakfast Theodis Blaze, MD   40 mg at 09/01/13 0849  . metoprolol succinate (TOPROL-XL) 24 hr tablet 200 mg  200 mg Oral Daily Theodis Blaze, MD   200 mg at 09/01/13 1019  . ondansetron (ZOFRAN) tablet 4  mg  4 mg Oral Q6H PRN Theodis Blaze, MD       Or  . ondansetron [facility]) injection 4 mg  4 mg Intravenous Q6H PRN Theodis Blaze, MD      . pantoprazole (PROTONIX) EC tablet 40 mg  40 mg Oral Daily Theodis Blaze, MD   40 mg at 09/01/13 1019  . venlafaxine (EFFEXOR) tablet 75 mg  75 mg Oral BID Theodis Blaze, MD   75 mg at 09/01/13 1019    Psychiatric Specialty Exam:     Blood pressure 154/87, pulse 55, temperature 98.4 F (36.9 C), temperature source Oral, resp. rate 20, height '5\' 4"'   (1.626 m), weight 218 lb 0.6 oz (98.9 kg), SpO2 100.00%.Body mass index is 37.41 kg/(m^2).  General Appearance: Casual  Eye Contact::  Fair  Speech:  Slow  Volume:  Decreased  Mood:  Anxious  Affect:  Constricted  Thought Process:  Goal Directed and Logical  Orientation:  Full (Time, Place, and Person)  Thought Content:  WDL  Suicidal Thoughts:  No  Homicidal Thoughts:  No  Memory:  Immediate;   Fair Recent;   Good Remote;   Good  Judgement:  Intact  Insight:  Fair  Psychomotor Activity:  Decreased  Concentration:  Fair  Recall:  Good  Fund of Knowledge:Good  Language: Fair  Akathisia:  No  Handed:  Right  AIMS (if indicated):     Assets:  Communication Skills Desire for Improvement Housing Social Support  Sleep:      Musculoskeletal: Strength & Muscle Tone: within normal limits Gait & Station: Patient is lying on the bed Patient leans: N/A  Treatment Plan Summary: Patient does not need inpatient psychiatric services. patient can be discharged to outpatient services with followup to see her psychiatrist.  Continue current medication.  We will sign off however if there is any question than Please call [phone].   Arlyce Harman Ar[place] 09/01/2013 4:50 PM

## 2013-09-01 NOTE — Evaluation (Signed)
Occupational Therapy Evaluation Patient Details Name: Samantha [name] MRN: [id number] DOB: 12-Jan-1946 Today's Date: 09/01/2013    History of Present Illness Patient is a 68 y.o. Female with DM, bipolar disorder, presenting from [facility] for evaluation and management of hyperglycemia, CBG > 400. She was admitted to [facility] on 08/29/2013 and was doing well except having persistently high CBG's. She denies chest pain, shortness of breath, no specific abd or urinary concerns, no fevers,c hills.   Clinical Impression   Pt presents to OT with decreased I with ADL activity and will benefit from skilled OT to increase I with ADL activity and return to PLOF    Follow Up Recommendations  Home health OT;Supervision/Assistance - 24 hour    Equipment Recommendations  None recommended by OT       Precautions / Restrictions Precautions Precautions: Fall      Mobility Bed Mobility Overal bed mobility: Independent                Transfers Overall transfer level: Needs assistance Equipment used: Rolling walker (2 wheeled) Transfers: Sit to/from Stand           General transfer comment: min guard without RW    Balance Overall balance assessment: Needs assistance         Standing balance support: No upper extremity supported;During functional activity Standing balance-Leahy Scale: Poor                              ADL Overall ADL's : Needs assistance/impaired     Grooming: Minimal assistance;Standing   Upper Body Bathing: Min guard;Sitting   Lower Body Bathing: Sit to/from stand;Moderate assistance   Upper Body Dressing : Minimal assistance;Sitting   Lower Body Dressing: Moderate assistance;Sit to/from stand   Toilet Transfer: Moderate assistance;Regular Museum/gallery exhibitions officer and Hygiene: Moderate assistance;Sit to/from stand         General ADL Comments: Pt very fatigued upon OT eval.  Pt did particiapate but did need extra time. No family  present     Vision                      Extremity/Trunk Assessment Upper Extremity Assessment Upper Extremity Assessment: Generalized weakness   Lower Extremity Assessment Lower Extremity Assessment: LLE deficits/detail;RLE deficits/detail;Generalized weakness RLE Sensation: history of peripheral neuropathy LLE Sensation: history of peripheral neuropathy       Communication Communication Communication: No difficulties   Cognition Arousal/Alertness: Awake/alert Behavior During Therapy: WFL for tasks assessed/performed Overall Cognitive Status: Within Functional Limits for tasks assessed (all orientation correct except stated 21st)                       Home Living Family/patient expects to be discharged to:: Private residence Living Arrangements: Children Available Help at Discharge: Family Type of Home: Apartment       Home Layout: Two level;Bed/bath upstairs Alternate Level Stairs-Number of Steps: flight  pt not real sure Alternate Level Stairs-Rails: Right Bathroom Shower/Tub: Occupational psychologist: Standard     Home Equipment: Cane - single point          Prior Functioning/Environment Level of Independence: Independent with assistive device(s)             OT Diagnosis: Generalized weakness   OT Problem List: Decreased strength;Decreased activity tolerance;Decreased safety awareness   OT Treatment/Interventions: Self-care/ADL training;DME and/or AE instruction;Patient/family education  OT Goals(Current goals can be found in the care plan section) Acute Rehab OT Goals Patient Stated Goal: I want to go home OT Goal Formulation: With patient Time For Goal Achievement: 09/15/13 Potential to Achieve Goals: Good ADL Goals Pt Will Perform Grooming: with supervision;standing Pt Will Perform Lower Body Dressing: with supervision;sit to/from stand Pt Will Transfer to Toilet: with supervision;ambulating;regular height toilet Pt  Will Perform Toileting - Clothing Manipulation and hygiene: with supervision;sit to/from stand  OT Frequency: Min 2X/week    End of Session Nurse Communication: Mobility status  Activity Tolerance: Patient tolerated treatment well Patient left: in bed   Time: 1510-1535 OT Time Calculation (min): 25 min Charges:  OT General Charges $OT Visit: 1 Procedure OT Evaluation $Initial OT Evaluation Tier I: 1 Procedure OT Treatments $Self Care/Home Management : 23-37 mins G-Codes:    Betsy Pries 2013-09-07, 3:51 PM

## 2013-09-01 NOTE — Progress Notes (Signed)
Patient ID: Annalei Friesz, female   DOB: 1945/10/24, 68 y.o.   MRN: [id number]  [facility] PROGRESS NOTE  Rigby Swamy YNW:[id number] DOB: [birth year] DOA: 09/01/2013 PCP: Myriam Jacobson, MD  Brief narrative: Patient is a 68 y.o. Female with DM, bipolar disorder, presenting from [facility] for evaluation and management of hyperglycemia, CBG > 400. She was admitted to [facility] on 08/29/2013 and was doing well except having persistently high CBG's. She denies chest pain, shortness of breath, no specific abd or urinary concerns, no fevers, chills.   Assessment and Plan:  Active Problems:  Hyperglycemia in pt with known diabetes mellitus  - CBG's better this AM, d/c insulin drip and transition to sub Q insulin - continue IVF, analgesia if needed, antiemetics if become needed  - A1C pending  - diabetic educator input requested Bipolar disorder  - clear for d/c home when medically stable - appreciate psychiatrist input Acute renal failure  - continue IVF as pt is responding well and repeat BMP in AM - hold Lasix  HLD  - continue statin  HTN  - continue clonidine and metoprolol  Acute on chronic blood loss anemia  - no signs of bleeding  - drop in Hg over the past 2 days noted  - close monitoring  - continue iron supplementation   Radiological Exams on Admission:  Dg Chest Port 1 View 08/31/2013 Left basilar atelectasis.   Consultants:  Psych   Antibiotics:  None   Code Status: Full Family Communication: Pt at bedside Disposition Plan: Home when medically stable  HPI/Subjective: No events overnight.   Objective: Filed Vitals:   09/01/13 0016 09/01/13 0545  BP: 173/68 156/92  Pulse: 66 56  Temp: 98.3 F (36.8 C) 98 F (36.7 C)  TempSrc: Oral Oral  Resp: 16 22  Height: 5\' 4"  (1.626 m)   Weight: 98.9 kg (218 lb 0.6 oz)   SpO2: 99% 98%    Intake/Output Summary (Last 24 hours) at 09/01/13 0836 Last data filed at 09/01/13 6578  Gross per 24 hour  Intake    240  ml  Output    651 ml  Net   -411 ml   Exam:   General:  Pt is alert, follows commands appropriately, not in acute distress  Cardiovascular: Regular rate and rhythm, S1/S2, no murmurs, no rubs, no gallops  Respiratory: Clear to auscultation bilaterally, no wheezing, no crackles, no rhonchi  Abdomen: Soft, non tender, non distended, bowel sounds present, no guarding  Extremities: No edema, pulses DP and PT palpable bilaterally  Neuro: Grossly nonfocal  Data Reviewed: Basic Metabolic Panel:  Recent Labs Lab 08/29/13 1101 08/31/13 2050 09/01/13 0345  NA 133* 128* 137  K 5.1 5.2 4.6  CL 94* 95* 104  CO2 26 24 24   GLUCOSE 291* 462* 189*  BUN 25* 30* 27*  CREATININE 1.62* 1.85* 1.59*  CALCIUM 9.9 8.9 9.0   CBC:  Recent Labs Lab 08/29/13 1101 08/31/13 2050 09/01/13 0345  WBC 10.5 7.1 7.6  NEUTROABS 7.2 4.2  --   HGB 11.3* 9.3* 9.3*  HCT 36.0 29.1* 29.3*  MCV 78.4 78.4 78.3  PLT 290 233 243   CBG:  Recent Labs Lab 09/01/13 0142 09/01/13 0243 09/01/13 0342 09/01/13 0442 09/01/13 0543  GLUCAP 227* 212* 180* 153* 134*   Scheduled Meds: . allopurinol  300 mg Oral Daily  . aspirin EC  81 mg Oral Daily  . atorvastatin  20 mg Oral q1800  . cloNIDine  0.3 mg Transdermal Weekly  .  enoxaparin (LOVENOX) injection  40 mg Subcutaneous Q24H  . ferrous sulfate  325 mg Oral Q breakfast  . gabapentin  300 mg Oral TID  . insulin aspart  0-15 Units Subcutaneous TID WC  . insulin aspart  0-5 Units Subcutaneous QHS  . insulin aspart  6 Units Subcutaneous TID WC  . insulin glargine  50 Units Subcutaneous Daily  . insulin regular  0-10 Units Intravenous TID WC  . LORazepam  0.5 mg Oral 3 times per day  . lurasidone  40 mg Oral Q breakfast  . metoprolol  200 mg Oral Daily  . pantoprazole  40 mg Oral Daily  . venlafaxine  75 mg Oral BID   Continuous Infusions: . sodium chloride       Theodis Blaze, MD  [facility] Pager [phone]  If 7PM-7AM, please contact  night-coverage [email or web address] Password [facility] 09/01/2013, 8:36 AM   LOS: 0 days

## 2013-09-01 NOTE — Progress Notes (Signed)
Inpatient Diabetes Program Recommendations  AACE/ADA: New Consensus Statement on Inpatient Glycemic Control (2013)  Target Ranges:  Prepandial:   less than 140 mg/dL      Peak postprandial:   less than 180 mg/dL (1-2 hours)      Critically ill patients:  140 - 180 mg/dL   Reason for Visit: Diabetes Consult  Diabetes history: DM2 Outpatient Diabetes medications: Lantus 10 units in am and 80 units QHS, Victoza 1.8 mg Q12noon. Current orders for Inpatient glycemic control: Lantus 50 units QDAC, Novolog 6 units tidwc and moderate tidwc and hs  68 year old female transferred from [facility] for hyperglycemia. pt has no AG, no acidosis, just elevated CBGs,  placed on glucostabilizer and then transitioned to Lantus and Novolog when CBG's better controlled.  Pt states weight stays about the same. Checks blood sugars at home several times/day and has hypoglycemia on occasion in the early am. Admits to drinking Cokes, juices and high CHO foods. Willing to go to [facility] for diabetes education. Pt requests Lantus pen instead of vial and syringe.  Discussed importance of glycemic control to prevent long-term complications. Pt willing to make changes with diet and exercise. Discussed how diet, exercise and stress play a role in controlling blood sugars.  Also requests prescription for blood glucose strips and lancets. HgbA1C pending. Will order Living Well With Diabetes book and encouraged pt to view diabetes videos on pt ed channel.  Results for TIAHNA, CURE (MRN [id number]) as of 09/01/2013 13:47  Ref. Range 09/01/2013 06:46 09/01/2013 07:35 09/01/2013 08:54 09/01/2013 10:04 09/01/2013 11:10  Glucose-Capillary Latest Range: 70-99 mg/dL 121 (H) 123 (H) 246 (H) 226 (H) 197 (H)  Results for ROBINA, HAMOR (MRN [id number]) as of 09/01/2013 13:47  Ref. Range 08/31/2013 20:50 09/01/2013 03:45  Sodium Latest Range: 137-147 mEq/L 128 (L) 137  Potassium Latest Range: 3.7-5.3 mEq/L 5.2 4.6  Chloride Latest Range: 96-112  mEq/L 95 (L) 104  CO2 Latest Range: 19-32 mEq/L 24 24  BUN Latest Range: 6-23 mg/dL 30 (H) 27 (H)  Creatinine Latest Range: 0.50-1.10 mg/dL 1.85 (H) 1.59 (H)  Calcium Latest Range: 8.4-10.5 mg/dL 8.9 9.0  GFR calc non Af Amer Latest Range: >90 mL/min 27 (L) 33 (L)  GFR calc Af Amer Latest Range: >90 mL/min 32 (L) 38 (L)      Inpatient Diabetes Program Recommendations Insulin - Basal: On Lantus 50 units QD. If FBS>180 mg/dL, increase Lantus by 5 units. Correction (SSI): Novolog moderate tidwc and hs Insulin - Meal Coverage: If CBGs >180 mg/dL, increase meal coverage insulin to 10 units tidwc HgbA1C: Check HgbA1C to assess glycemic control prior to hospitalization Outpatient Referral: OP Diabetes Education consult for Type 2 DM Diet: CHO mod med  Recommmendations:  At discharge, please order insulin pens instead of vial and syringe. Needs prescription for strips and lancets. Will need prescription for Victoza at discharge. To f/u with Dr. Lorene Dy, PCP.  Discussed above with RN. Thank you. Lorenda Peck, RD, LDN, CDE Inpatient Diabetes Coordinator [phone]

## 2013-09-01 NOTE — H&P (Signed)
[facility] History and Physical  Samantha [name] IOE:[id number] DOB: Nov 21, 1945 DOA: 09/01/2013  Referring physician: ED physician PCP: Samantha Jacobson, MD   Chief Complaint: hyperglycemia  HPI:  Patient is a 68 y.o. Female with DM, bipolar disorder, presenting from [place] for evaluation and management of hyperglycemia, CBG > 400. She was admitted to [facility] on 08/29/2013 and was doing well except having persistently high CBG's. She denies chest pain, shortness of breath, no specific abd or urinary concerns, no fevers,c hills.  Assessment and Plan: Active Problems: Hyperglycemia in pt with known diabetes mellitus - will admit to medical floor - pt has no AG, no acidosis, just elevated CBG - will place on glucostabilizer and will add insulin regimen once CBG's better controlled - continue IVF, analgesia if needed, antiemetics if become needed - will check A1C - may need diabetic educator input  Bipolar disorder - will need to call psych to refer pt back to [facility] once medically stable Acute renal failure  will place on IVF, will hold Lasix for now - repeat BMP in AM HLD - continue statin  HTN - continue clonidine and metoprolol  Acute on chronic blood loss anemia - no signs of bleeding - drop in Hg over the past 2 days noted - close monitoring - continue iron supplementation    Radiological Exams on Admission: Dg Chest Port 1 View  08/31/2013  Left basilar atelectasis.     Code Status: Full Family Communication: Pt at bedside Disposition Plan: Admit for further evaluation     Review of Systems:  Constitutional: Negative for fever, chills and malaise/fatigue. Negative for diaphoresis.  HENT: Negative for hearing loss, ear pain, nosebleeds, congestion, sore throat, neck pain, tinnitus and ear discharge.   Eyes: Negative for blurred vision, double vision, photophobia, pain, discharge and redness.  Respiratory: Negative for cough, hemoptysis, sputum production, shortness  of breath, wheezing and stridor.   Cardiovascular: Negative for chest pain, palpitations, orthopnea, claudication and leg swelling.  Gastrointestinal: Negative for nausea, vomiting and abdominal pain. Negative for heartburn, constipation, blood in stool and melena.  Genitourinary: Negative for dysuria, urgency, frequency, hematuria and flank pain.  Musculoskeletal: Negative for myalgias, back pain, joint pain and falls.  Skin: Negative for itching and rash.  Neurological:  Negative for tingling, tremors, sensory change, speech change, focal weakness, loss of consciousness and headaches.  Endo/Heme/Allergies: Negative for environmental allergies and polydipsia. Does not bruise/bleed easily.  Psychiatric/Behavioral: Negative for suicidal ideas. The patient is not nervous/anxious.      Past Medical History  Diagnosis Date  . Anemia   . Diabetes mellitus without complication   . Hypertension   . Neuropathy   . CHF (congestive heart failure)   . Bipolar 1 disorder   . Depression   . Stroke     Past Surgical History  Procedure Laterality Date  . Cesarean section      Social History:  reports that she has never smoked. She does not have any smokeless tobacco history on file. She reports that she does not drink alcohol. Her drug history is not on file.  Allergies  Allergen Reactions  . Peanut-Containing Drug Products     No known family medical history   Prior to Admission medications   Medication Sig Start Date End Date Taking? Authorizing Provider  albuterol (PROVENTIL HFA;VENTOLIN HFA) 108 (90 BASE) MCG/ACT inhaler Inhale 2 puffs into the lungs every 6 (six) hours as needed for wheezing.    Historical Provider, MD  allopurinol (ZYLOPRIM) 300  MG tablet Take 300 mg by mouth daily.    Historical Provider, MD  aspirin EC 81 MG tablet Take 81 mg by mouth daily.    Historical Provider, MD  atorvastatin (LIPITOR) 20 MG tablet Take 20 mg by mouth daily.    Historical Provider, MD   cloNIDine (CATAPRES - DOSED IN MG/24 HR) 0.3 mg/24hr patch Place 1 patch onto the skin once a week.    Historical Provider, MD  dexlansoprazole (DEXILANT) 60 MG capsule Take 60 mg by mouth daily.    Historical Provider, MD  ferrous sulfate 325 (65 FE) MG tablet Take 325 mg by mouth daily with breakfast.    Historical Provider, MD  furosemide (LASIX) 20 MG tablet Take 20 mg by mouth.    Historical Provider, MD  gabapentin (NEURONTIN) 300 MG capsule Take 300 mg by mouth 3 (three) times daily.    Historical Provider, MD  insulin glargine (LANTUS) 100 UNIT/ML injection Inject 10-80 Units into the skin 2 (two) times daily. 10 units in the morning and 80 units at night    Historical Provider, MD  Liraglutide (VICTOZA) 18 MG/3ML SOPN Inject into the skin.    Historical Provider, MD  LORazepam (ATIVAN) 0.5 MG tablet Take 0.5 mg by mouth every 8 (eight) hours.    Historical Provider, MD  lurasidone (LATUDA) 40 MG TABS tablet Take 40 mg by mouth daily with breakfast.    Historical Provider, MD  metoprolol (TOPROL-XL) 200 MG 24 hr tablet Take 200 mg by mouth daily.    Historical Provider, MD  venlafaxine (EFFEXOR) 75 MG tablet Take 75 mg by mouth 2 (two) times daily.    Historical Provider, MD    Physical Exam: There were no vitals filed for this visit.  Physical Exam  Constitutional: Appears well-developed and well-nourished. No distress.  HENT: Normocephalic. External right and left ear normal. Oropharynx is clear and moist.  Eyes: Conjunctivae and EOM are normal. PERRLA, no scleral icterus.  Neck: Normal ROM. Neck supple. No JVD. No tracheal deviation. No thyromegaly.  CVS: RRR, S1/S2 +, no murmurs, no gallops, no carotid bruit.  Pulmonary: Effort and breath sounds normal, no stridor, rhonchi, wheezes, rales.  Abdominal: Soft. BS +,  no distension, tenderness, rebound or guarding.  Musculoskeletal: Normal range of motion. No edema and no tenderness.  Lymphadenopathy: No lymphadenopathy noted,  cervical, inguinal. Neuro: Alert. Normal reflexes, muscle tone coordination. No cranial nerve deficit. Skin: Skin is warm and dry. No rash noted. Not diaphoretic. No erythema. No pallor.  Psychiatric: Normal mood and affect.   Labs on Admission:  Basic Metabolic Panel:  Recent Labs Lab 08/29/13 1101 08/31/13 2050  NA 133* 128*  K 5.1 5.2  CL 94* 95*  CO2 26 24  GLUCOSE 291* 462*  BUN 25* 30*  CREATININE 1.62* 1.85*  CALCIUM 9.9 8.9   CBC:  Recent Labs Lab 08/29/13 1101 08/31/13 2050  WBC 10.5 7.1  NEUTROABS 7.2 4.2  HGB 11.3* 9.3*  HCT 36.0 29.1*  MCV 78.4 78.4  PLT 290 233   CBG:  Recent Labs Lab 08/31/13 1154 08/31/13 1721 08/31/13 1822 08/31/13 2004 08/31/13 2154  GLUCAP 306* 426* 486* 456* 350*    EKG: Normal sinus rhythm, no ST/T wave changes  Theodis Blaze, MD  [facility] Pager [phone]  If 7PM-7AM, please contact night-coverage [email or web address] Password [email or web address] 09/01/2013, 12:08 AM

## 2013-09-02 DIAGNOSIS — F29 Unspecified psychosis not due to a substance or known physiological condition: Secondary | ICD-10-CM

## 2013-09-02 LAB — GLUCOSE, CAPILLARY
GLUCOSE-CAPILLARY: 205 mg/dL — AB (ref 70–99)
Glucose-Capillary: 181 mg/dL — ABNORMAL HIGH (ref 70–99)

## 2013-09-02 LAB — BASIC METABOLIC PANEL
BUN: 25 mg/dL — ABNORMAL HIGH (ref 6–23)
CALCIUM: 9.3 mg/dL (ref 8.4–10.5)
CO2: 25 meq/L (ref 19–32)
Chloride: 103 mEq/L (ref 96–112)
Creatinine, Ser: 1.51 mg/dL — ABNORMAL HIGH (ref 0.50–1.10)
GFR calc Af Amer: 40 mL/min — ABNORMAL LOW (ref >90)
GFR calc non Af Amer: 35 mL/min — ABNORMAL LOW (ref >90)
GLUCOSE: 222 mg/dL — AB (ref 70–99)
Potassium: 5.1 mEq/L (ref 3.7–5.3)
Sodium: 136 mEq/L — ABNORMAL LOW (ref 137–147)

## 2013-09-02 MED ORDER — LORAZEPAM 0.5 MG PO TABS: 0.5000 mg | ORAL_TABLET | Freq: Three times a day (TID) | ORAL | Status: DC

## 2013-09-02 MED ORDER — INSULIN GLARGINE 100 UNITS/ML SOLOSTAR PEN: PEN_INJECTOR | SUBCUTANEOUS | Status: DC

## 2013-09-02 MED ORDER — ALBUTEROL SULFATE HFA 108 (90 BASE) MCG/ACT IN AERS: 2.0000 | INHALATION_SPRAY | Freq: Four times a day (QID) | RESPIRATORY_TRACT | Status: AC | PRN

## 2013-09-02 MED ORDER — LIRAGLUTIDE 18 MG/3ML ~~LOC~~ SOPN: 1.8000 mg | PEN_INJECTOR | Freq: Every day | SUBCUTANEOUS | Status: DC

## 2013-09-02 MED ORDER — HYDROCODONE-ACETAMINOPHEN 5-325 MG PO TABS: 1.0000 | ORAL_TABLET | Freq: Four times a day (QID) | ORAL | Status: DC | PRN

## 2013-09-02 NOTE — Progress Notes (Signed)
Clinical Social Work Department BRIEF PSYCHOSOCIAL ASSESSMENT 09/02/2013  Patient:  Samantha [name], Samantha [name]     Account Number:  [id number]     Admit date:  09/01/2013  Clinical Social Worker:  Ulyess Blossom  Date/Time:  09/02/2013 10:52 AM  Referred by:  Care Management  Date Referred:  09/02/2013 Referred for  ALF Placement   Other Referral:   Interview type:  Patient Other interview type:   and patient daughter via telephone    PSYCHOSOCIAL DATA Living Status:  FAMILY Admitted from facility:  [facility] Level of care:   Primary support name:  Samantha [name]/daughter/[phone] Primary support relationship to patient:  CHILD, ADULT Degree of support available:   adequate    CURRENT CONCERNS Current Concerns  Post-Acute Placement   Other Concerns:    SOCIAL WORK ASSESSMENT / PLAN CSW received notification that pt admitted from [facility], but psychiatry has cleared pt and recommends outpatient therapy. Chart indicated that pt was from home, but RNCM spoke with pt daughter, Samantha [name] who stated that pt plan is to discharge to [facility]F where pt previously resided.    CSW met with pt at bedside to discuss. Pt confirmed that she was at [facility] previously before her admission to [facility]. CSW discussed with pt that pt is medically ready for discharge today and explored with pt about disposition plan as pt daughter reports that plan is for pt to return to [facility]. Pt expressed that she did not want to return to ALF and wanted to speak with pt daughter in [place] about potential of discharging home with pt daughter that lives in [place]. Pt daughter, Samantha [name] contacted pt room telephone and discussed with pt that she is unable to provide the care pt requires at home as she works during the day and both pt daughters feel that pt needs to return to [facility] at discharge. Pt states that she is agreeable to  return at this time and CSW provided support and emphasized that return to ALF is safest plan at this time and pt can work on alternative plans from the facility if she wishes. Pt expressed understanding and is hopeful that pt daughter will transport her to ALF.    CSW contacted [facility] and spoke with RN, Lendon Colonel. Per RN, pt was at [facility] and discharged home and plan was for pt to be at home for one week and then return to ALF, but in the interim pt ended up being admitted to [facility]. Per RN at ALF, facility needs to review pt clinicals, but anticipate that they can accept pt back to ALF today.    CSW completed FL2 and faxed clinicals to ALF. CSW awaiting return phone call to confirm that pt can return to ALF today.    CSW spoke with pt daughter, Samantha [name] who confirmed that she could transport pt via private vehicle back to [facility].    CSW to continue to follow and facilitate pt discharge needs to ALF this afternoon.   Assessment/plan status:  Psychosocial Support/Ongoing Assessment of Needs Other assessment/ plan:   discharge planning   Information/referral to community resources:   Referral to [facility]    PATIENT'S/FAMILY'S RESPONSE TO PLAN OF CARE: Pt alert and oriented x 4. Pt tearful about plan to return to [facility]F, but knows that pt daughters feel like this is best plan at this time. Pt is hopeful that  after returning to ALF that she can develop a new plan for living and not have to stay in the ALF for a long time.   Alison Murray, MSW, [place] Work [phone]
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
## 2013-09-02 NOTE — Discharge Summary (Addendum)
Physician Discharge Summary  Samantha [name] WCB:[id number] DOB: Sep 24, 1945 DOA: 09/01/2013  PCP: Samantha Jacobson, MD  Admit date: 09/01/2013 Discharge date: 09/02/2013  Recommendations for Outpatient Follow-up:  1. Pt will need to follow up with PCP in 2-3 weeks post discharge 2. Please obtain BMP to evaluate electrolytes and kidney function 3. Please also check CBC to evaluate Hg and Hct levels 4. Pt will be discharged to ALF   Discharge Diagnoses:  Active Problems:   Hyperglycemia  Discharge Condition: Stable  Diet recommendation: Heart healthy diet discussed in details   Brief narrative:  Patient is a 68 y.o. Female with DM, bipolar disorder, presenting from [name] for evaluation and management of hyperglycemia, CBG > 400. She was admitted to [name] on 08/29/2013 and was doing well except having persistently high CBG's. She denies chest pain, shortness of breath, no specific abd or urinary concerns, no fevers, chills.   Assessment and Plan:  Active Problems:  Hyperglycemia in pt with known diabetes mellitus, uncontrolled with complications of neuropathy  - CBG's better this AM, d/c insulin drip and transition to sub Q insulin 4/20 successfully, pt tolerating current diet well  - analgesia if needed - A1C 9.1 - diabetic educator input provided - continue Victoza and Lantus 10 U in am and 80 U in pm Bipolar disorder  - clear for d/c home or ALF, pt wants ALF  - appreciate psychiatrist input  Acute renal failure  - from pre renal failure - Cr is trending down, close monitoring of renal function as pt is on Lasix  HLD  - continue statin  HTN  - continue clonidine and metoprolol  Acute on chronic blood loss anemia  - no signs of bleeding  - drop in Hg since admission noted but remains stable overall  - continue iron supplementation   Radiological Exams on Admission:   Dg Chest Port 1 View 08/31/2013 Left basilar atelectasis.  Consultants:  Psych  Antibiotics:  None    Code Status: Full  Family Communication: Pt at bedside    Discharge Exam: Filed Vitals:   09/02/13 0617  BP: 137/66  Pulse: 55  Temp: 98 F (36.7 C)  Resp: 18   Filed Vitals:   09/01/13 0545 09/01/13 1459 09/01/13 2108 09/02/13 0617  BP: 156/92 154/87 132/69 137/66  Pulse: 56 55 55 55  Temp: 98 F (36.7 C) 98.4 F (36.9 C) 98.6 F (37 C) 98 F (36.7 C)  TempSrc: Oral Oral Oral Oral  Resp: 22 20 20 18   Height:      Weight:      SpO2: 98% 100% 100% 100%    General: Pt is alert, follows commands appropriately, not in acute distress Cardiovascular: Regular rate and rhythm, S1/S2 +, no murmurs, no rubs, no gallops Respiratory: Clear to auscultation bilaterally, no wheezing, no crackles, no rhonchi Abdominal: Soft, non tender, non distended, bowel sounds +, no guarding Extremities: no edema, no cyanosis, pulses palpable bilaterally DP and PT Neuro: Grossly nonfocal  Discharge Instructions  Discharge Orders   Future Orders Complete By Expires   Ambulatory referral to Nutrition and Diabetic Education  As directed    Diet - low sodium heart healthy  As directed    Increase activity slowly  As directed        Medication List    STOP taking these medications       insulin glargine 100 UNIT/ML injection  Commonly known as:  LANTUS  Replaced by:  insulin glargine 100 unit/mL Sopn  TAKE these medications       albuterol 108 (90 BASE) MCG/ACT inhaler  Commonly known as:  PROVENTIL HFA;VENTOLIN HFA  Inhale 2 puffs into the lungs every 6 (six) hours as needed for wheezing.     allopurinol 300 MG tablet  Commonly known as:  ZYLOPRIM  Take 300 mg by mouth daily.     aspirin EC 81 MG tablet  Take 81 mg by mouth daily.     atorvastatin 20 MG tablet  Commonly known as:  LIPITOR  Take 20 mg by mouth daily.     cloNIDine 0.3 mg/24hr patch  Commonly known as:  CATAPRES - Dosed in mg/24 hr  Place 1 patch onto the skin once a week.     dexlansoprazole 60 MG  capsule  Commonly known as:  DEXILANT  Take 60 mg by mouth daily.     ferrous sulfate 325 (65 FE) MG tablet  Take 325 mg by mouth daily with breakfast.     furosemide 20 MG tablet  Commonly known as:  LASIX  Take 20 mg by mouth.     gabapentin 300 MG capsule  Commonly known as:  NEURONTIN  Take 300 mg by mouth 3 (three) times daily.     HYDROcodone-acetaminophen 5-325 MG per tablet  Commonly known as:  NORCO/VICODIN  Take 1 tablet by mouth every 6 (six) hours as needed for moderate pain.     insulin glargine 100 unit/mL Sopn  Commonly known as:  LANTUS  Inject 10 U in the AM and 80 U in the PM     Liraglutide 18 MG/3ML Sopn  Commonly known as:  VICTOZA  Inject 1.8 mg into the skin daily at 12 noon.     LORazepam 0.5 MG tablet  Commonly known as:  ATIVAN  Take 1 tablet (0.5 mg total) by mouth every 8 (eight) hours.     lurasidone 40 MG Tabs tablet  Commonly known as:  LATUDA  Take 40 mg by mouth daily with breakfast.     metoprolol 200 MG 24 hr tablet  Commonly known as:  TOPROL-XL  Take 200 mg by mouth daily.     venlafaxine 75 MG tablet  Commonly known as:  EFFEXOR  Take 75 mg by mouth 2 (two) times daily.           Follow-up Information   Schedule an appointment as soon as possible for a visit with ROBERTS, Sharol Given, MD.   Specialty:  Internal Medicine   Contact information:   [address], Samantha [name] [phone]        The results of significant diagnostics from this hospitalization (including imaging, microbiology, ancillary and laboratory) are listed below for reference.     Microbiology: No results found for this or any previous visit (from the past 240 hour(s)).   Labs: Basic Metabolic Panel:  Recent Labs Lab 08/29/13 1101 08/31/13 2050 09/01/13 0345 09/02/13 0332  NA 133* 128* 137 136*  K 5.1 5.2 4.6 5.1  CL 94* 95* 104 103  CO2 26 24 24 25   GLUCOSE 291* 462* 189* 222*  BUN 25* 30* 27* 25*  CREATININE 1.62*  1.85* 1.59* 1.51*  CALCIUM 9.9 8.9 9.0 9.3   CBC:  Recent Labs Lab 08/29/13 1101 08/31/13 2050 09/01/13 0345  WBC 10.5 7.1 7.6  NEUTROABS 7.2 4.2  --   HGB 11.3* 9.3* 9.3*  HCT 36.0 29.1* 29.3*  MCV 78.4 78.4 78.3  PLT 290 233 243  BNP: BNP (last 3 results)  Recent Labs  08/31/13 2050  PROBNP 102.0   CBG:  Recent Labs Lab 09/01/13 1110 09/01/13 1215 09/01/13 1710 09/01/13 2111 09/02/13 0731  GLUCAP 197* 173* 241* 165* 205*     SIGNED: Time coordinating discharge: Over 30 minutes  Theodis Blaze, MD  [facility] 09/02/2013, 9:24 AM Pager [phone]  If 7PM-7AM, please contact night-coverage [email or web address] Password [email or web address]

## 2013-09-02 NOTE — Progress Notes (Signed)
CSW received return phone call from [facility] who stated that they received clinical information and per Resident Care Coordinator at ALF, pt clinical information had to be sent to regional nurse for review given pt recent psychiatric inpatient admission. Awaiting return phone call from ALF confirming that facility can accept pt back to ALF today.  Unit RN updated.  CSW to continue to follow and facilitate pt discharge needs this afternoon.   Alison Murray, MSW, [place] Work [phone]

## 2013-09-02 NOTE — Progress Notes (Signed)
Physical Therapy Treatment Patient Details Name: Nick Armel MRN: [id number] DOB: [birth year] Today's Date: 09/02/2013    History of Present Illness Pt was admitted with DM, CBGgreater than 400.  She has a h/o bipolar    PT Comments    *Pt is progressing well with mobility. She walked 250' with a straight cane, without loss of balance. OK to DC to ALF from PT standpoint. **  Follow Up Recommendations  Supervision - Intermittent;Home health PT (if has caregivers)     Equipment Recommendations  none   Recommendations for Other Services       Precautions / Restrictions Precautions Precautions: Fall Restrictions Weight Bearing Restrictions: No    Mobility  Bed Mobility Overal bed mobility: Independent             General bed mobility comments: flat bed, no rails  Transfers Overall transfer level: Modified independent Equipment used: Straight cane Transfers: Sit to/from Stand Sit to Stand: Modified independent (Device/Increase time)         General transfer comment: with [facility]  Ambulation/Gait Ambulation/Gait assistance: Modified independent (Device/Increase time) Ambulation Distance (Feet): 250 Feet Assistive device: Straight cane Gait Pattern/deviations: WFL(Within Functional Limits)     General Gait Details: steady with SPC   Stairs            Wheelchair Mobility    Modified Rankin (Stroke Patients Only)       Balance                                    Cognition Arousal/Alertness: Awake/alert Behavior During Therapy: WFL for tasks assessed/performed Overall Cognitive Status: Within Functional Limits for tasks assessed                      Exercises      General Comments        Pertinent Vitals/Pain *0/10 pain**    Home Living                 Additional Comments: hopes to be discharged back to ALF    Prior Function Level of Independence: Independent with assistive device(s)          PT  Goals (current goals can now be found in the care plan section) Acute Rehab PT Goals Patient Stated Goal: pt plans to DC to ALF PT Goal Formulation: With patient Time For Goal Achievement: 09/15/13 Potential to Achieve Goals: Good Progress towards PT goals: Progressing toward goals    Frequency  Min 3X/week    PT Plan Current plan remains appropriate    Co-evaluation             End of Session Equipment Utilized During Treatment: Gait belt Activity Tolerance: Patient tolerated treatment well Patient left: in bed;with call bell/phone within reach     Time: 1415-1425 PT Time Calculation (min): 10 min  Charges:  $Gait Training: 8-22 mins                    G Codes:      Lucile Crater 09/02/2013, 2:33 PM [phone]

## 2013-09-02 NOTE — Progress Notes (Signed)
[facility] is providing the following services: commode  If patient discharges after hours, please call [phone].   Samantha [name] 09/02/2013, 11:59 AM

## 2013-09-02 NOTE — Progress Notes (Signed)
Clinical Social Work  No return call from ALF so CSW called ALF again to ensure that staff received information re: outpatient referrals for psychiatrists. ALF has information and report they will assist with setting up appointment.  [place], [place] [phone]

## 2013-09-02 NOTE — Progress Notes (Signed)
CSW spoke with pt daughter at bedside who stated that she spoke with [facility] and ALF confirmed that pt could return.  CSW contacted [facility] and confirmed that pt cleared to return to ALF.  CSW facilitated pt discharge needs including contacting facility, faxing pt discharge information, discussing with pt and pt daughter at bedside, providing RN phone number to call report, and providing discharge packet to pt daughter to provide to [facility] upon arrival to facility. Pt daughter transporting pt via private vehicle.  No further social work needs identified at this time.  CSW signing off.   Alison Murray, MSW, [place] Work [phone]

## 2013-09-02 NOTE — Progress Notes (Signed)
Occupational Therapy Treatment Patient Details Name: Enas Winchel MRN: [id number] DOB: 1945-07-15 Today's Date: 09/02/2013    History of present illness Pt was admitted with DM, CBGgreater than 400.  She has a h/o bipolar   OT comments  Pt motivated and had no LOB during OT session.  Retrieved items with supervision using RW.  Pt has call light she can use at ALF for assistance.  She usually goes to room to get medications and walks to dining room  Follow Up Recommendations  Home health OT;Supervision/Assistance - 24 hour    Equipment Recommendations  None recommended by OT    Recommendations for Other Services      Precautions / Restrictions Precautions Precautions: Fall Restrictions Weight Bearing Restrictions: No       Mobility Bed Mobility Overal bed mobility: Independent             General bed mobility comments: flat bed, no rails  Transfers   Equipment used: Rolling walker (2 wheeled) Transfers: Sit to/from Stand Sit to Stand: Modified independent (Device/Increase time)         General transfer comment: cues for hand placement    Balance                                   ADL                                         General ADL Comments: pt walked around room with RW to retrieve items in closet for adls.  supervision level with RW. Pt usually uses a cane.  No LOB, cues to position herself close.  Pt able to don socks after setting herself up      [place] During Therapy: [facility] for tasks assessed/performed Overall Cognitive Status: Within Functional Limits for tasks assessed                       Extremity/Trunk Assessment               Exercises     Shoulder Instructions       General Comments      Pertinent Vitals/ Pain       No c/o pain  Home Living                                    Additional Comments: hopes to be discharged back to ALF      Prior Functioning/Environment Level of Independence: Independent with assistive device(s)            Frequency Min 2X/week     Progress Toward Goals  OT Goals(current goals can now be found in the care plan section)  Progress towards OT goals: Progressing toward goals     Plan      Co-evaluation                 End of Session     Activity Tolerance     Patient Left     Nurse Communication  Time: 2197-5883 OT Time Calculation (min): 9 min  Charges: OT General Charges $OT Visit: 1 Procedure OT Treatments $Self Care/Home Management : 8-22 mins  [facility] 09/02/2013, 1:59 PM   Lesle Chris, OTR/L [phone] 09/02/2013

## 2013-09-02 NOTE — Discharge Instructions (Signed)
Blood Glucose Monitoring, Adult Monitoring your blood glucose (also know as blood sugar) helps you to manage your diabetes. It also helps you and your health care provider monitor your diabetes and determine how well your treatment plan is working. WHY SHOULD YOU MONITOR YOUR BLOOD GLUCOSE?  It can help you understand how food, exercise, and medicine affect your blood glucose.  It allows you to know what your blood glucose is at any given moment. You can quickly tell if you are having low blood glucose (hypoglycemia) or high blood glucose (hyperglycemia).  It can help you and your health care provider know how to adjust your medicines.  It can help you understand how to manage an illness or adjust medicine for exercise. WHEN SHOULD YOU TEST? Your health care provider will help you decide how often you should check your blood glucose. This may depend on the type of diabetes you have, your diabetes control, or the types of medicines you are taking. Be sure to write down all of your blood glucose readings so that this information can be reviewed with your health care provider. See below for examples of testing times that your health care provider may suggest. Type 1 Diabetes  Test 4 times a day if you are in good control, using an insulin pump, or perform multiple daily injections.  If your diabetes is not well-controlled or if you are sick, you may need to monitor more often.  It is a good idea to also monitor:  Before and after exercise.  Between meals and 2 hours after a meal.  Occasionally between 2:00 to 3:00 am. Type 2 Diabetes  It can vary with each person, but generally, if you are on insulin, test 4 times a day.  If you take medicines by mouth (orally), test 2 times a day.  If you are on a controlled diet, test once a day.  If your diabetes is not well controlled or if you are sick, you may need to monitor more often. HOW TO MONITOR YOUR BLOOD GLUCOSE Supplies  Needed  Blood glucose meter.  Test strips for your meter. Each meter has its own strips. You must use the strips that go with your own meter.  A pricking needle (lancet).  A device that holds the lancet (lancing device).  A journal or log book to write down your results. Procedure  Wash your hands with soap and water. Alcohol is not preferred.  Prick the side of your finger (not the tip) with the lancet.  Gently milk the finger until a small drop of blood appears.  Follow the instructions that come with your meter for inserting the test strip, applying blood to the strip, and using your blood glucose meter. Other Areas to Get Blood for Testing Some meters allow you to use other areas of your body (other than your finger) to test your blood. These areas are called alternative sites. The most common alternative sites are:  The forearm.  The thigh.  The back area of the lower leg.  The palm of the hand. The blood flow in these areas is slower. Therefore, the blood glucose values you get may be delayed, and the numbers are different from what you would get from your fingers. Do not use alternative sites if you think you are having hypoglycemia. Your reading will not be accurate. Always use a finger if you are having hypoglycemia. Also, if you cannot feel your lows (hypoglycemia unawareness), always use your fingers for your blood  sites if you think you are having hypoglycemia. Your reading will not be accurate. Always use a finger if you are having hypoglycemia. Also, if you cannot feel your lows (hypoglycemia unawareness), always use your fingers for your blood glucose checks.  ADDITIONAL TIPS FOR GLUCOSE MONITORING  · Do not reuse lancets.  · Always carry your supplies with you.  · All blood glucose meters have a 24-hour "hotline" number to call if you have questions or need help.  · Adjust (calibrate) your blood glucose meter with a control solution after finishing a few boxes of strips.  BLOOD GLUCOSE RECORD KEEPING  It is a good idea to keep a daily record or log of your blood glucose readings. Most glucose meters, if not all, keep your glucose records stored in the meter. Some meters come with the ability to download  your records to your home computer. Keeping a record of your blood glucose readings is especially helpful if you are wanting to look for patterns. Make notes to go along with the blood glucose readings because you might forget what happened at that exact time. Keeping good records helps you and your health care provider to work together to achieve good diabetes management.   Document Released: 05/04/2003 Document Revised: 01/01/2013 Document Reviewed: 09/23/2012  [employer]® Patient Information ©2014 [employer].

## 2013-12-22 ENCOUNTER — Other Ambulatory Visit: Payer: Self-pay | Admitting: Geriatric Medicine

## 2013-12-22 DIAGNOSIS — Z1231 Encounter for screening mammogram for malignant neoplasm of breast: Secondary | ICD-10-CM

## 2013-12-29 ENCOUNTER — Ambulatory Visit
Admission: RE | Admit: 2013-12-29 | Discharge: 2013-12-29 | Disposition: A | Discharge status: Home / Self Care | Payer: Medicare Other | Source: Ambulatory Visit | Attending: Geriatric Medicine | Admitting: Geriatric Medicine

## 2013-12-29 ENCOUNTER — Encounter (INDEPENDENT_AMBULATORY_CARE_PROVIDER_SITE_OTHER): Payer: Self-pay

## 2013-12-29 DIAGNOSIS — Z1231 Encounter for screening mammogram for malignant neoplasm of breast: Secondary | ICD-10-CM

## 2014-04-05 ENCOUNTER — Encounter (HOSPITAL_COMMUNITY): Payer: Self-pay | Admitting: Emergency Medicine

## 2014-04-05 ENCOUNTER — Emergency Department (HOSPITAL_COMMUNITY)
Admission: EM | Admit: 2014-04-05 | Discharge: 2014-04-06 | Disposition: A | Discharge status: Home / Self Care | Payer: Medicare Other | Attending: Emergency Medicine | Admitting: Emergency Medicine

## 2014-04-05 DIAGNOSIS — G629 Polyneuropathy, unspecified: Secondary | ICD-10-CM | POA: Insufficient documentation

## 2014-04-05 DIAGNOSIS — I10 Essential (primary) hypertension: Secondary | ICD-10-CM | POA: Insufficient documentation

## 2014-04-05 DIAGNOSIS — I509 Heart failure, unspecified: Secondary | ICD-10-CM | POA: Insufficient documentation

## 2014-04-05 DIAGNOSIS — Z7952 Long term (current) use of systemic steroids: Secondary | ICD-10-CM | POA: Insufficient documentation

## 2014-04-05 DIAGNOSIS — F319 Bipolar disorder, unspecified: Secondary | ICD-10-CM | POA: Insufficient documentation

## 2014-04-05 DIAGNOSIS — E1165 Type 2 diabetes mellitus with hyperglycemia: Secondary | ICD-10-CM | POA: Diagnosis not present

## 2014-04-05 DIAGNOSIS — Z7982 Long term (current) use of aspirin: Secondary | ICD-10-CM | POA: Insufficient documentation

## 2014-04-05 DIAGNOSIS — D649 Anemia, unspecified: Secondary | ICD-10-CM | POA: Insufficient documentation

## 2014-04-05 DIAGNOSIS — R739 Hyperglycemia, unspecified: Secondary | ICD-10-CM

## 2014-04-05 DIAGNOSIS — Z794 Long term (current) use of insulin: Secondary | ICD-10-CM | POA: Insufficient documentation

## 2014-04-05 DIAGNOSIS — Z8673 Personal history of transient ischemic attack (TIA), and cerebral infarction without residual deficits: Secondary | ICD-10-CM | POA: Insufficient documentation

## 2014-04-05 LAB — CBC
HEMATOCRIT: 39.2 % (ref 36.0–46.0)
HEMOGLOBIN: 12.6 g/dL (ref 12.0–15.0)
MCH: 27.1 pg (ref 26.0–34.0)
MCHC: 32.1 g/dL (ref 30.0–36.0)
MCV: 84.3 fL (ref 78.0–100.0)
Platelets: 217 10*3/uL (ref 150–400)
RBC: 4.65 MIL/uL (ref 3.87–5.11)
RDW: 14.9 % (ref 11.5–15.5)
WBC: 9.2 10*3/uL (ref 4.0–10.5)

## 2014-04-05 LAB — COMPREHENSIVE METABOLIC PANEL
ALT: 17 U/L (ref 0–35)
AST: 14 U/L (ref 0–37)
Albumin: 3.1 g/dL — ABNORMAL LOW (ref 3.5–5.2)
Alkaline Phosphatase: 105 U/L (ref 39–117)
Anion gap: 13 (ref 5–15)
BUN: 36 mg/dL — ABNORMAL HIGH (ref 6–23)
CO2: 23 meq/L (ref 19–32)
CREATININE: 1.7 mg/dL — AB (ref 0.50–1.10)
Calcium: 9.4 mg/dL (ref 8.4–10.5)
Chloride: 97 mEq/L (ref 96–112)
GFR, EST AFRICAN AMERICAN: 35 mL/min — AB (ref >90)
GFR, EST NON AFRICAN AMERICAN: 30 mL/min — AB (ref >90)
GLUCOSE: 441 mg/dL — AB (ref 70–99)
Potassium: 4.3 mEq/L (ref 3.7–5.3)
SODIUM: 133 meq/L — AB (ref 137–147)
Total Bilirubin: <0.2 mg/dL — ABNORMAL LOW (ref 0.3–1.2)
Total Protein: 7.2 g/dL (ref 6.0–8.3)

## 2014-04-05 LAB — URINALYSIS, ROUTINE W REFLEX MICROSCOPIC
Bilirubin Urine: NEGATIVE
Glucose, UA: NEGATIVE mg/dL
HGB URINE DIPSTICK: NEGATIVE
Ketones, ur: NEGATIVE mg/dL
Leukocytes, UA: NEGATIVE
Nitrite: NEGATIVE
PROTEIN: NEGATIVE mg/dL
Specific Gravity, Urine: >1.046 — ABNORMAL HIGH (ref 1.005–1.030)
UROBILINOGEN UA: 0.2 mg/dL (ref 0.0–1.0)
pH: 5.5 (ref 5.0–8.0)

## 2014-04-05 LAB — CBG MONITORING, ED
GLUCOSE-CAPILLARY: 278 mg/dL — AB (ref 70–99)
Glucose-Capillary: 254 mg/dL — ABNORMAL HIGH (ref 70–99)
Glucose-Capillary: 435 mg/dL — ABNORMAL HIGH (ref 70–99)

## 2014-04-05 MED ORDER — SODIUM CHLORIDE 0.9 % IV BOLUS (SEPSIS)
1000.0000 mL | Freq: Once | INTRAVENOUS | Status: AC
  Administered 2014-04-05: 1000 mL via INTRAVENOUS

## 2014-04-05 MED ORDER — INSULIN ASPART 100 UNIT/ML ~~LOC~~ SOLN
8.0000 [IU] | Freq: Once | SUBCUTANEOUS | Status: AC
  Administered 2014-04-05: 8 [IU] via SUBCUTANEOUS
  Filled 2014-04-05: qty 1

## 2014-04-05 NOTE — ED Provider Notes (Signed)
CSN: [id number]     Arrival date & time 04/05/14  2118 History   First MD Initiated Contact with Patient 04/05/14 2125     Chief Complaint  Patient presents with  . Hyperglycemia      HPI  She presents for evaluation of hyperglycemia. History of insulin-dependent diabetes. Lives at an assisted-living facility. Didn't awaken with her family. States she did not give herself insulin since Friday because "I don't [place]". Blood sugar over 500 upon arrival there and referred here. She is asymptomatic. No dyspnea. No fevers. No symptoms.  Past Medical History  Diagnosis Date  . Anemia   . Diabetes mellitus without complication   . Hypertension   . Neuropathy   . CHF (congestive heart failure)   . Bipolar 1 disorder   . Depression   . Stroke    Past Surgical History  Procedure Laterality Date  . Cesarean section     History reviewed. No pertinent family history. History  Substance Use Topics  . Smoking status: Never Smoker   . Smokeless tobacco: Not on file  . Alcohol Use: No   OB History    No data available     Review of Systems  Constitutional: Negative for fever, chills, diaphoresis, appetite change and fatigue.  HENT: Negative for mouth sores, sore throat and trouble swallowing.   Eyes: Negative for visual disturbance.  Respiratory: Negative for cough, chest tightness, shortness of breath and wheezing.   Cardiovascular: Negative for chest pain.  Gastrointestinal: Negative for nausea, vomiting, abdominal pain, diarrhea and abdominal distention.  Endocrine: Negative for polydipsia, polyphagia and polyuria.  Genitourinary: Negative for dysuria, frequency and hematuria.  Musculoskeletal: Negative for gait problem.  Skin: Negative for color change, pallor and rash.  Neurological: Negative for dizziness, syncope, light-headedness and headaches.  Hematological: Does not bruise/bleed easily.  Psychiatric/Behavioral: Negative for behavioral problems and confusion.       Allergies  Peanut-containing drug products  Home Medications   Prior to Admission medications   Medication Sig Start Date End Date Taking? Authorizing Provider  acetaminophen (TYLENOL) 500 MG tablet Take 500 mg by mouth every 6 (six) hours as needed for mild pain.   Yes Historical Provider, MD  atorvastatin (LIPITOR) 20 MG tablet Take 20 mg by mouth daily.   Yes Historical Provider, MD  cyanocobalamin 500 MCG tablet Take 500 mcg by mouth daily.   Yes Historical Provider, MD  ferrous sulfate 325 (65 FE) MG tablet Take 325 mg by mouth daily with breakfast.   Yes Historical Provider, MD  furosemide (LASIX) 20 MG tablet Take 20 mg by mouth.   Yes Historical Provider, MD  hydrocortisone cream 1 % Apply 1 application topically 2 (two) times daily as needed for itching. For hemorrhoids   Yes Historical Provider, MD  Liraglutide (VICTOZA) 18 MG/3ML SOPN Inject 1.8 mg into the skin daily at 12 noon. 09/02/13  Yes Theodis Blaze, MD  LORazepam (ATIVAN) 0.5 MG tablet Take 1 tablet (0.5 mg total) by mouth every 8 (eight) hours. 09/02/13  Yes Theodis Blaze, MD  lurasidone (LATUDA) 80 MG TABS tablet Take 80 mg by mouth 2 (two) times daily.   Yes Historical Provider, MD  metoprolol (TOPROL-XL) 200 MG 24 hr tablet Take 200 mg by mouth daily.   Yes Historical Provider, MD  pregabalin (LYRICA) 100 MG capsule Take 100 mg by mouth 2 (two) times daily.   Yes Historical Provider, MD  senna-docusate (SENOKOT-S) 8.6-50 MG per tablet Take 2 tablets by  mouth at bedtime.   Yes Historical Provider, MD  venlafaxine (EFFEXOR) 75 MG tablet Take 75-150 mg by mouth 2 (two) times daily with a meal. Take 1 in the morning Take 2 at bedtime   Yes Historical Provider, MD  albuterol (PROVENTIL HFA;VENTOLIN HFA) 108 (90 BASE) MCG/ACT inhaler Inhale 2 puffs into the lungs every 6 (six) hours as needed for wheezing. 09/02/13   Theodis Blaze, MD  allopurinol (ZYLOPRIM) 300 MG tablet Take 300 mg by mouth daily.    Historical  Provider, MD  aspirin EC 81 MG tablet Take 81 mg by mouth daily.    Historical Provider, MD  cloNIDine (CATAPRES - DOSED IN MG/24 HR) 0.3 mg/24hr patch Place 1 patch onto the skin once a week.    Historical Provider, MD  dexlansoprazole (DEXILANT) 60 MG capsule Take 60 mg by mouth daily.    Historical Provider, MD  gabapentin (NEURONTIN) 300 MG capsule Take 300 mg by mouth 3 (three) times daily.    Historical Provider, MD  HYDROcodone-acetaminophen (NORCO/VICODIN) 5-325 MG per tablet Take 1 tablet by mouth every 6 (six) hours as needed for moderate pain. 09/02/13   Theodis Blaze, MD  insulin glargine (LANTUS) 100 unit/mL SOPN Inject 10 U in the AM and 80 U in the PM Patient taking differently: Inject 20-70 Units into the skin 2 (two) times daily. Inject 20 unitsin the AM and 70 units in the PM 09/02/13   Theodis Blaze, MD  lurasidone (LATUDA) 40 MG TABS tablet Take 40 mg by mouth daily with breakfast.    Historical Provider, MD   BP 140/64 mmHg  Pulse 67  Temp(Src) 98.8 F (37.1 C) (Oral)  Resp 18  SpO2 100% Physical Exam  Constitutional: She is oriented to person, place, and time. She appears well-developed and well-nourished. No distress.  HENT:  Head: Normocephalic.  Eyes: Conjunctivae are normal. Pupils are equal, round, and reactive to light. No scleral icterus.  Neck: Normal range of motion. Neck supple. No thyromegaly present.  Cardiovascular: Normal rate and regular rhythm.  Exam reveals no gallop and no friction rub.   No murmur heard. Pulmonary/Chest: Effort normal and breath sounds normal. No respiratory distress. She has no wheezes. She has no rales.  Abdominal: Soft. Bowel sounds are normal. She exhibits no distension. There is no tenderness. There is no rebound.  Musculoskeletal: Normal range of motion.  Neurological: She is alert and oriented to person, place, and time.  Skin: Skin is warm and dry. No rash noted.  Psychiatric: She has a normal mood and affect. Her behavior  is normal.    ED Course  Procedures (including critical care time) Labs Review Labs Reviewed  COMPREHENSIVE METABOLIC PANEL - Abnormal; Notable for the following:    Sodium 133 (*)    Glucose, Bld 441 (*)    BUN 36 (*)    Creatinine, Ser 1.70 (*)    Albumin 3.1 (*)    Total Bilirubin <0.2 (*)    GFR calc non Af Amer 30 (*)    GFR calc Af Amer 35 (*)    All other components within normal limits  URINALYSIS, ROUTINE W REFLEX MICROSCOPIC - Abnormal; Notable for the following:    APPearance CLOUDY (*)    Specific Gravity, Urine >1.046 (*)    All other components within normal limits  CBG MONITORING, ED - Abnormal; Notable for the following:    Glucose-Capillary 435 (*)    All other components within normal limits  CBG  MONITORING, ED - Abnormal; Notable for the following:    Glucose-Capillary 278 (*)    All other components within normal limits  CBC  CBG MONITORING, ED    Imaging Review No results found.   EKG Interpretation None      MDM   Final diagnoses:  Hyperglycemia    Given fluids and insulin. Recheck blood sugar improving. Not acidotic. Asymptomatic. Plan is discharge back to her care facility. Please check blood sugar upon her arrival. Resume regular insulin dosages.    Tanna Furry, MD 04/05/14 [phone]

## 2014-04-05 NOTE — ED Notes (Signed)
cbg 435

## 2014-04-05 NOTE — ED Notes (Signed)
Bed: WI09 Expected date:  Expected time:  Means of arrival:  Comments: EMS 68yo Hyperglycemia

## 2014-04-05 NOTE — Discharge Instructions (Signed)
Please check blood sugar upon arrival to facility.   Hyperglycemia Hyperglycemia occurs when the glucose (sugar) in your blood is too high. Hyperglycemia can happen for many reasons, but it most often happens to people who do not know they have diabetes or are not managing their diabetes properly.  CAUSES  Whether you have diabetes or not, there are other causes of hyperglycemia. Hyperglycemia can occur when you have diabetes, but it can also occur in other situations that you might not be as aware of, such as: Diabetes  If you have diabetes and are having problems controlling your blood glucose, hyperglycemia could occur because of some of the following reasons:  Not following your meal plan.  Not taking your diabetes medications or not taking it properly.  Exercising less or doing less activity than you normally do.  Being sick. Pre-diabetes  This cannot be ignored. Before people develop Type 2 diabetes, they almost always have "pre-diabetes." This is when your blood glucose levels are higher than normal, but not yet high enough to be diagnosed as diabetes. Research has shown that some long-term damage to the body, especially the heart and circulatory system, may already be occurring during pre-diabetes. If you take action to manage your blood glucose when you have pre-diabetes, you may delay or prevent Type 2 diabetes from developing. Stress  If you have diabetes, you may be "diet" controlled or on oral medications or insulin to control your diabetes. However, you may find that your blood glucose is higher than usual in the hospital whether you have diabetes or not. This is often referred to as "stress hyperglycemia." Stress can elevate your blood glucose. This happens because of hormones put out by the body during times of stress. If stress has been the cause of your high blood glucose, it can be followed regularly by your caregiver. That way he/she can make sure your hyperglycemia does  not continue to get worse or progress to diabetes. Steroids  Steroids are medications that act on the infection fighting system (immune system) to block inflammation or infection. One side effect can be a rise in blood glucose. Most people can produce enough extra insulin to allow for this rise, but for those who cannot, steroids make blood glucose levels go even higher. It is not unusual for steroid treatments to "uncover" diabetes that is developing. It is not always possible to determine if the hyperglycemia will go away after the steroids are stopped. A special blood test called an A1c is sometimes done to determine if your blood glucose was elevated before the steroids were started. SYMPTOMS  Thirsty.  Frequent urination.  Dry mouth.  Blurred vision.  Tired or fatigue.  Weakness.  Sleepy.  Tingling in feet or leg. DIAGNOSIS  Diagnosis is made by monitoring blood glucose in one or all of the following ways:  A1c test. This is a chemical found in your blood.  Fingerstick blood glucose monitoring.  Laboratory results. TREATMENT  First, knowing the cause of the hyperglycemia is important before the hyperglycemia can be treated. Treatment may include, but is not be limited to:  Education.  Change or adjustment in medications.  Change or adjustment in meal plan.  Treatment for an illness, infection, etc.  More frequent blood glucose monitoring.  Change in exercise plan.  Decreasing or stopping steroids.  Lifestyle changes. HOME CARE INSTRUCTIONS   Test your blood glucose as directed.  Exercise regularly. Your caregiver will give you instructions about exercise. Pre-diabetes or diabetes which comes  on with stress is helped by exercising.  Eat wholesome, balanced meals. Eat often and at regular, fixed times. Your caregiver or nutritionist will give you a meal plan to guide your sugar intake.  Being at an ideal weight is important. If needed, losing as little as 10  to 15 pounds may help improve blood glucose levels. SEEK MEDICAL CARE IF:   You have questions about medicine, activity, or diet.  You continue to have symptoms (problems such as increased thirst, urination, or weight gain). SEEK IMMEDIATE MEDICAL CARE IF:   You are vomiting or have diarrhea.  Your breath smells fruity.  You are breathing faster or slower.  You are very sleepy or incoherent.  You have numbness, tingling, or pain in your feet or hands.  You have chest pain.  Your symptoms get worse even though you have been following your caregiver's orders.  If you have any other questions or concerns. Document Released: 10/25/2000 Document Revised: 07/24/2011 Document Reviewed: 08/28/2011 [facility] Patient Information 2015 [employer], [place]. This information is not intended to replace advice given to you by your health care provider. Make sure you discuss any questions you have with your health care provider.

## 2014-04-05 NOTE — ED Notes (Signed)
Pt from [facility] living. States she has been with her family since Friday and has not taken her insulin. States she did not know how to use her insulin injection. Given insulin tonight at nursing home after [facility] reading high. Read 536 for EMS. Alert and oriented x 4.

## 2015-03-24 ENCOUNTER — Other Ambulatory Visit: Payer: Self-pay

## 2015-03-24 DIAGNOSIS — Z1231 Encounter for screening mammogram for malignant neoplasm of breast: Secondary | ICD-10-CM

## 2015-04-15 ENCOUNTER — Ambulatory Visit
Admission: RE | Admit: 2015-04-15 | Discharge: 2015-04-15 | Disposition: A | Discharge status: Home / Self Care | Payer: Medicare Other | Source: Ambulatory Visit

## 2015-04-15 DIAGNOSIS — Z1231 Encounter for screening mammogram for malignant neoplasm of breast: Secondary | ICD-10-CM

## 2015-09-12 ENCOUNTER — Encounter (HOSPITAL_COMMUNITY): Payer: Self-pay | Admitting: *Deleted

## 2015-09-12 ENCOUNTER — Emergency Department (HOSPITAL_COMMUNITY): Payer: Medicare Other

## 2015-09-12 ENCOUNTER — Emergency Department (HOSPITAL_COMMUNITY)
Admission: EM | Admit: 2015-09-12 | Discharge: 2015-09-12 | Disposition: A | Discharge status: Home / Self Care | Payer: Medicare Other | Attending: Emergency Medicine | Admitting: Emergency Medicine

## 2015-09-12 DIAGNOSIS — Z79899 Other long term (current) drug therapy: Secondary | ICD-10-CM | POA: Diagnosis not present

## 2015-09-12 DIAGNOSIS — Z8673 Personal history of transient ischemic attack (TIA), and cerebral infarction without residual deficits: Secondary | ICD-10-CM | POA: Diagnosis not present

## 2015-09-12 DIAGNOSIS — M545 Low back pain: Secondary | ICD-10-CM | POA: Insufficient documentation

## 2015-09-12 DIAGNOSIS — Z794 Long term (current) use of insulin: Secondary | ICD-10-CM | POA: Insufficient documentation

## 2015-09-12 DIAGNOSIS — I10 Essential (primary) hypertension: Secondary | ICD-10-CM | POA: Insufficient documentation

## 2015-09-12 DIAGNOSIS — F319 Bipolar disorder, unspecified: Secondary | ICD-10-CM | POA: Diagnosis not present

## 2015-09-12 DIAGNOSIS — E119 Type 2 diabetes mellitus without complications: Secondary | ICD-10-CM | POA: Diagnosis not present

## 2015-09-12 DIAGNOSIS — I509 Heart failure, unspecified: Secondary | ICD-10-CM | POA: Diagnosis not present

## 2015-09-12 DIAGNOSIS — R109 Unspecified abdominal pain: Secondary | ICD-10-CM | POA: Insufficient documentation

## 2015-09-12 DIAGNOSIS — G629 Polyneuropathy, unspecified: Secondary | ICD-10-CM | POA: Insufficient documentation

## 2015-09-12 DIAGNOSIS — R1030 Lower abdominal pain, unspecified: Secondary | ICD-10-CM | POA: Diagnosis present

## 2015-09-12 DIAGNOSIS — D649 Anemia, unspecified: Secondary | ICD-10-CM | POA: Insufficient documentation

## 2015-09-12 LAB — COMPREHENSIVE METABOLIC PANEL
ALBUMIN: 3.4 g/dL — AB (ref 3.5–5.0)
ALT: 20 U/L (ref 14–54)
ANION GAP: 10 (ref 5–15)
AST: 19 U/L (ref 15–41)
Alkaline Phosphatase: 69 U/L (ref 38–126)
BUN: 23 mg/dL — ABNORMAL HIGH (ref 6–20)
CHLORIDE: 103 mmol/L (ref 101–111)
CO2: 22 mmol/L (ref 22–32)
Calcium: 10.1 mg/dL (ref 8.9–10.3)
Creatinine, Ser: 1.76 mg/dL — ABNORMAL HIGH (ref 0.44–1.00)
GFR calc Af Amer: 33 mL/min — ABNORMAL LOW (ref >60)
GFR calc non Af Amer: 29 mL/min — ABNORMAL LOW (ref >60)
GLUCOSE: 337 mg/dL — AB (ref 65–99)
POTASSIUM: 4.6 mmol/L (ref 3.5–5.1)
SODIUM: 135 mmol/L (ref 135–145)
Total Bilirubin: 0.2 mg/dL — ABNORMAL LOW (ref 0.3–1.2)
Total Protein: 6.9 g/dL (ref 6.5–8.1)

## 2015-09-12 LAB — URINE MICROSCOPIC-ADD ON

## 2015-09-12 LAB — URINALYSIS, ROUTINE W REFLEX MICROSCOPIC
Bilirubin Urine: NEGATIVE
GLUCOSE, UA: NEGATIVE mg/dL
Hgb urine dipstick: NEGATIVE
Ketones, ur: NEGATIVE mg/dL
Leukocytes, UA: NEGATIVE
Nitrite: NEGATIVE
Protein, ur: 100 mg/dL — AB
SPECIFIC GRAVITY, URINE: 1.017 (ref 1.005–1.030)
pH: 5 (ref 5.0–8.0)

## 2015-09-12 LAB — LIPASE, BLOOD: LIPASE: 76 U/L — AB (ref 11–51)

## 2015-09-12 LAB — CBC
HEMATOCRIT: 41.7 % (ref 36.0–46.0)
HEMOGLOBIN: 13.6 g/dL (ref 12.0–15.0)
MCH: 27.4 pg (ref 26.0–34.0)
MCHC: 32.6 g/dL (ref 30.0–36.0)
MCV: 83.9 fL (ref 78.0–100.0)
Platelets: 226 10*3/uL (ref 150–400)
RBC: 4.97 MIL/uL (ref 3.87–5.11)
RDW: 16 % — ABNORMAL HIGH (ref 11.5–15.5)
WBC: 9.9 10*3/uL (ref 4.0–10.5)

## 2015-09-12 LAB — CBG MONITORING, ED: Glucose-Capillary: 276 mg/dL — ABNORMAL HIGH (ref 65–99)

## 2015-09-12 MED ORDER — SODIUM CHLORIDE 0.9 % IV SOLN: INTRAVENOUS | Status: DC

## 2015-09-12 NOTE — ED Provider Notes (Signed)
CSN: [id number]     Arrival date & time 09/12/15  0957 History   First MD Initiated Contact with Patient 09/12/15 1008     Chief Complaint  Patient presents with  . Abdominal Pain    HPI The patient presents to the emergency room for evaluation from [place] home. Initial report from EMS was that the staff at the facility were concerned that the patient had an altered mental status. Here in the emergency room, the patient is alert and awake and states that she is having trouble with abdominal pain. The patient states this discomfort is vague but she feels like something is going on down there. She points towards her lower abdomen. She has aching in her lower back. She denies any trouble with diarrhea. She denies any trouble with vomiting. She denies any trouble with chest pain or shortness of breath. Patient states she has a mild headache but nothing severe. She denies any confusion. She denies any fevers or chills. Denies urinary discomfort. Past Medical History  Diagnosis Date  . Anemia   . Diabetes mellitus without complication ([place])   . Hypertension   . Neuropathy ([place])   . CHF (congestive heart failure) ([place])   . Bipolar 1 disorder ([place])   . Depression   . Stroke [facility])    Past Surgical History  Procedure Laterality Date  . Cesarean section     No family history on file. Social History  Substance Use Topics  . Smoking status: Never Smoker   . Smokeless tobacco: None  . Alcohol Use: No   OB History    No data available     Review of Systems  All other systems reviewed and are negative.     Allergies  Peanut-containing drug products  Home Medications   Prior to Admission medications   Medication Sig Start Date End Date Taking? Authorizing Provider  acetaminophen (TYLENOL) 500 MG tablet Take 500 mg by mouth every 6 (six) hours as needed for mild pain.   Yes Historical Provider, MD  albuterol (PROVENTIL HFA;VENTOLIN HFA) 108 (90 BASE) MCG/ACT inhaler Inhale  2 puffs into the lungs every 6 (six) hours as needed for wheezing. 09/02/13  Yes Theodis Blaze, MD  atorvastatin (LIPITOR) 40 MG tablet Take 40 mg by mouth daily.   Yes Historical Provider, MD  ferrous sulfate 325 (65 FE) MG tablet Take 325 mg by mouth 2 (two) times daily with a meal.    Yes Historical Provider, MD  hydrALAZINE (APRESOLINE) 25 MG tablet Take 25 mg by mouth 3 (three) times daily. 09/08/15  Yes Historical Provider, MD  hydrocortisone cream 1 % Apply 1 application topically 2 (two) times daily as needed for itching. For hemorrhoids   Yes Historical Provider, MD  insulin glargine (LANTUS) 100 unit/mL SOPN Inject 10 U in the AM and 80 U in the PM Patient taking differently: Inject 47-65 Units into the skin 2 (two) times daily. Inject 47 unitsin the AM and 65 units in the PM 09/02/13  Yes Theodis Blaze, MD  latanoprost (XALATAN) 0.005 % ophthalmic solution Place 1 drop into both eyes at bedtime. 08/30/15  Yes Historical Provider, MD  Liraglutide (VICTOZA) 18 MG/3ML SOPN Inject 1.8 mg into the skin daily at 12 noon. 09/02/13  Yes Theodis Blaze, MD  LORazepam (ATIVAN) 1 MG tablet Take 1 mg by mouth 3 (three) times daily. 08/26/15  Yes Historical Provider, MD  lurasidone (LATUDA) 80 MG TABS tablet Take 80 mg by mouth 2 (  two) times daily.   Yes Historical Provider, MD  Melatonin 3 MG TABS Take 3 mg by mouth at bedtime.   Yes Historical Provider, MD  metoprolol (TOPROL-XL) 200 MG 24 hr tablet Take 200 mg by mouth daily.   Yes Historical Provider, MD  pregabalin (LYRICA) 50 MG capsule Take 50 mg by mouth 2 (two) times daily.   Yes Historical Provider, MD  tiZANidine (ZANAFLEX) 2 MG tablet Take 2 mg by mouth 2 (two) times daily.  09/08/15  Yes Historical Provider, MD  traMADol (ULTRAM) 50 MG tablet Take 50 mg by mouth 2 (two) times daily as needed. pain 08/27/15  Yes Historical Provider, MD  venlafaxine (EFFEXOR) 75 MG tablet Take 75-150 mg by mouth 2 (two) times daily with a meal. Take 1 in the  morning Take 2 at bedtime   Yes Historical Provider, MD  cloNIDine (CATAPRES - DOSED IN MG/24 HR) 0.3 mg/24hr patch Place 1 patch onto the skin once a week.    Historical Provider, MD   BP 170/86 mmHg  Pulse 65  Resp 17  SpO2 100% Physical Exam  Constitutional: She is oriented to person, place, and time. She appears well-developed and well-nourished. No distress.  HENT:  Head: Normocephalic and atraumatic.  Right Ear: External ear normal.  Left Ear: External ear normal.  Eyes: Conjunctivae are normal. Right eye exhibits no discharge. Left eye exhibits no discharge. No scleral icterus.  Neck: Neck supple. No tracheal deviation present.  Cardiovascular: Normal rate, regular rhythm and intact distal pulses.   Pulmonary/Chest: Effort normal and breath sounds normal. No stridor. No respiratory distress. She has no wheezes. She has no rales.  Abdominal: Soft. Bowel sounds are normal. She exhibits no distension. There is no tenderness. There is no rebound and no guarding.  Musculoskeletal: She exhibits no edema or tenderness.       Lumbar back: She exhibits no tenderness, no bony tenderness, no swelling and no edema.  Neurological: She is alert and oriented to person, place, and time. She has normal strength. No cranial nerve deficit (no facial droop, extraocular movements intact, no slurred speech) or sensory deficit. She exhibits normal muscle tone. She displays no seizure activity. Coordination normal.  Patient is aware that she is at [facility] emergency room, she noted she lives at [place], she knows that the year is 2017  Skin: Skin is warm and dry. No rash noted.  Psychiatric: She has a normal mood and affect.  Nursing note and vitals reviewed.   ED Course  Procedures (including critical care time) Labs Review Labs Reviewed  COMPREHENSIVE METABOLIC PANEL - Abnormal; Notable for the following:    Glucose, Bld 337 (*)    BUN 23 (*)    Creatinine, Ser 1.76 (*)     Albumin 3.4 (*)    Total Bilirubin 0.2 (*)    GFR calc non Af Amer 29 (*)    GFR calc Af Amer 33 (*)    All other components within normal limits  CBC - Abnormal; Notable for the following:    RDW 16.0 (*)    All other components within normal limits  URINALYSIS, ROUTINE W REFLEX MICROSCOPIC (NOT AT [facility]) - Abnormal; Notable for the following:    APPearance CLOUDY (*)    Protein, ur 100 (*)    All other components within normal limits  LIPASE, BLOOD - Abnormal; Notable for the following:    Lipase 76 (*)    All other components within normal limits  URINE MICROSCOPIC-ADD ON - Abnormal; Notable for the following:    Squamous Epithelial / LPF 0-5 (*)    Bacteria, UA MANY (*)    Casts HYALINE CASTS (*)    All other components within normal limits  CBG MONITORING, ED - Abnormal; Notable for the following:    Glucose-Capillary 276 (*)    All other components within normal limits  URINE CULTURE    Imaging Review Ct Abdomen Pelvis Wo Contrast  09/12/2015  CLINICAL DATA:  Low abdominal pain with diarrhea and bilateral leg pain starting today. EXAM: CT ABDOMEN AND PELVIS WITHOUT CONTRAST TECHNIQUE: Multidetector CT imaging of the abdomen and pelvis was performed following the standard protocol without IV contrast. COMPARISON:  None. FINDINGS: Lower chest: Mild atelectasis and/or scarring at the left lung base. Otherwise unremarkable. Hepatobiliary: No mass visualized within the liver on this unenhanced exam. Gallbladder appears normal. No bile duct dilatation seen. Pancreas: No mass or inflammatory process identified on this un-enhanced exam. Spleen: Within normal limits in size. Adrenals/Urinary Tract: Adrenal glands appear normal. Kidneys are unremarkable without stone or hydronephrosis. No ureteral or bladder calculi identified. Bladder appears normal. No bladder stone. Stomach/Bowel: Bowel is normal in caliber. No bowel wall thickening or evidence of bowel wall inflammation. Appendix appears  normal. Stomach appears normal. Vascular/Lymphatic: Scattered atherosclerotic changes of the normal- caliber abdominal aorta. Additional atherosclerotic changes of the aortic branch vessels and pelvic vasculature. No enlarged lymph nodes seen within the abdomen or pelvis. Reproductive: Mass within the left lower pelvis appears to be contiguous with the uterus, most likely exophytic uterine fibroid, measuring 4.4 x 3.6 cm. Additional calcified mass along the posterior margin of the uterus is also most likely a calcified uterine fibroid, measuring 3.8 cm greatest dimension. Adjacent adnexal regions are unremarkable. Other: No free fluid or abscess collections seen. No free intraperitoneal air. Musculoskeletal: Degenerative changes of the thoracolumbar spine, moderate in degree. Possible associated nerve root impingements at the L4-5 and L5-S1 levels. No acute or suspicious osseous lesions seen. Superficial soft tissues are unremarkable. There are small bilateral inguinal hernias which contain fat only. IMPRESSION: 1. No evidence of acute intra-abdominal or intrapelvic abnormality. No bowel obstruction or evidence of bowel wall inflammation. No free fluid. No evidence of acute solid organ abnormality. 2. Probable uterine fibroids, largest measuring 4.4 x 3.6 cm. 3. Degenerative changes of the thoracolumbar spine, moderate in degree. Possible associated nerve root impingements at the L4-5 and L5-S1 levels. No evidence of acute osseous abnormality. Electronically Signed   By: Franki Cabot M.D.   On: 09/12/2015 14:00   Dg Abd Acute W/chest  09/12/2015  CLINICAL DATA:  Abdominal pain and diarrhea. EXAM: DG ABDOMEN ACUTE W/ 1V CHEST COMPARISON:  08/31/2013 FINDINGS: Gaseous distension of the colon noted. No dilated loops of small bowel identified. Calcified fibroid identified within the central portion of the the pelvis. The heart size is normal. No pulmonary opacities identified. IMPRESSION: 1. No acute cardiopulmonary  abnormalities. 2. Nonobstructive bowel gas pattern. Electronically Signed   By: Kerby Moors M.D.   On: 09/12/2015 11:27   I have personally reviewed and evaluated these images and lab results as part of my medical decision-making.    MDM   Final diagnoses:  Abdominal pain, unspecified abdominal location    Patient's laboratory tests are reassuring. She has stable hyperglycemia without evidence of DKA. Urinalysis does show many bacteria have her there is negative nitrite or leukocyte esterase. I do not think this is definitive for urinary tract infection.  I will send off a urine culture.  CT scan does not show any acute abnormalities. The patient is aware of the disc disease as well as fibroids. I discussed the results with the patient as well as her daughter. She is stable for discharge and outpatient follow-up.    Dorie Rank, MD 09/12/15 [id number]

## 2015-09-12 NOTE — ED Notes (Signed)
Pt reports low abd pain with diarrhea and bila leg pain which started today.  Pt also reports mild h/a.  Pt with garbled speech, reports prior CVA in 2003.  Pt is A&Ox 4.  No unilateral weakness or facial droop noted at this time. MAE.

## 2015-09-12 NOTE — ED Notes (Signed)
Per EMS, pt from [facility] home.  Staff reports AMS, EMS states pt is A&Ox 4.  Pt has hx dementia.

## 2015-09-12 NOTE — Discharge Instructions (Signed)

## 2015-09-12 NOTE — ED Notes (Signed)
Rn will collect labs when starting IV

## 2015-09-12 NOTE — ED Notes (Signed)
Patient transported to CT 

## 2015-09-12 NOTE — ED Notes (Signed)
Bed: [id number] Expected date: 09/12/15 Expected time: 9:57 AM Means of arrival:  Comments: ?UTI

## 2015-09-14 LAB — URINE CULTURE: CULTURE: NO GROWTH

## 2016-06-02 ENCOUNTER — Encounter (HOSPITAL_COMMUNITY): Payer: Self-pay | Admitting: Emergency Medicine

## 2016-06-02 ENCOUNTER — Emergency Department (HOSPITAL_COMMUNITY)
Admission: EM | Admit: 2016-06-02 | Discharge: 2016-06-05 | Disposition: A | Discharge status: Other Acute Inpatient Hospital | Payer: Medicare Other | Attending: Emergency Medicine | Admitting: Emergency Medicine

## 2016-06-02 ENCOUNTER — Emergency Department (HOSPITAL_COMMUNITY): Payer: Medicare Other

## 2016-06-02 DIAGNOSIS — Z91010 Allergy to peanuts: Secondary | ICD-10-CM | POA: Diagnosis not present

## 2016-06-02 DIAGNOSIS — I509 Heart failure, unspecified: Secondary | ICD-10-CM | POA: Insufficient documentation

## 2016-06-02 DIAGNOSIS — R440 Auditory hallucinations: Secondary | ICD-10-CM | POA: Diagnosis not present

## 2016-06-02 DIAGNOSIS — Z8673 Personal history of transient ischemic attack (TIA), and cerebral infarction without residual deficits: Secondary | ICD-10-CM | POA: Diagnosis not present

## 2016-06-02 DIAGNOSIS — R0602 Shortness of breath: Secondary | ICD-10-CM

## 2016-06-02 DIAGNOSIS — Z794 Long term (current) use of insulin: Secondary | ICD-10-CM | POA: Diagnosis not present

## 2016-06-02 DIAGNOSIS — R441 Visual hallucinations: Secondary | ICD-10-CM

## 2016-06-02 DIAGNOSIS — F251 Schizoaffective disorder, depressive type: Secondary | ICD-10-CM | POA: Diagnosis present

## 2016-06-02 DIAGNOSIS — Z79899 Other long term (current) drug therapy: Secondary | ICD-10-CM | POA: Diagnosis not present

## 2016-06-02 DIAGNOSIS — F22 Delusional disorders: Secondary | ICD-10-CM | POA: Diagnosis not present

## 2016-06-02 DIAGNOSIS — E1140 Type 2 diabetes mellitus with diabetic neuropathy, unspecified: Secondary | ICD-10-CM | POA: Insufficient documentation

## 2016-06-02 DIAGNOSIS — I110 Hypertensive heart disease with heart failure: Secondary | ICD-10-CM | POA: Insufficient documentation

## 2016-06-02 LAB — URINALYSIS, ROUTINE W REFLEX MICROSCOPIC
Bilirubin Urine: NEGATIVE
Glucose, UA: NEGATIVE mg/dL
Hgb urine dipstick: NEGATIVE
Ketones, ur: NEGATIVE mg/dL
Leukocytes, UA: NEGATIVE
NITRITE: NEGATIVE
PH: 5 (ref 5.0–8.0)
Protein, ur: NEGATIVE mg/dL
SPECIFIC GRAVITY, URINE: 1.006 (ref 1.005–1.030)

## 2016-06-02 LAB — COMPREHENSIVE METABOLIC PANEL
ALT: 19 U/L (ref 14–54)
AST: 18 U/L (ref 15–41)
Albumin: 3.5 g/dL (ref 3.5–5.0)
Alkaline Phosphatase: 71 U/L (ref 38–126)
Anion gap: 7 (ref 5–15)
BUN: 29 mg/dL — AB (ref 6–20)
CHLORIDE: 104 mmol/L (ref 101–111)
CO2: 21 mmol/L — AB (ref 22–32)
CREATININE: 1.66 mg/dL — AB (ref 0.44–1.00)
Calcium: 10.1 mg/dL (ref 8.9–10.3)
GFR calc Af Amer: 35 mL/min — ABNORMAL LOW (ref >60)
GFR, EST NON AFRICAN AMERICAN: 30 mL/min — AB (ref >60)
Glucose, Bld: 189 mg/dL — ABNORMAL HIGH (ref 65–99)
POTASSIUM: 4.3 mmol/L (ref 3.5–5.1)
SODIUM: 132 mmol/L — AB (ref 135–145)
Total Bilirubin: <0.1 mg/dL — ABNORMAL LOW (ref 0.3–1.2)
Total Protein: 6.4 g/dL — ABNORMAL LOW (ref 6.5–8.1)

## 2016-06-02 LAB — RAPID URINE DRUG SCREEN, HOSP PERFORMED
AMPHETAMINES: NOT DETECTED
BENZODIAZEPINES: POSITIVE — AB
Barbiturates: NOT DETECTED
Cocaine: NOT DETECTED
OPIATES: NOT DETECTED
TETRAHYDROCANNABINOL: NOT DETECTED

## 2016-06-02 LAB — ETHANOL

## 2016-06-02 LAB — CBG MONITORING, ED: Glucose-Capillary: 103 mg/dL — ABNORMAL HIGH (ref 65–99)

## 2016-06-02 MED ORDER — MELATONIN 10 MG PO TABS: 10.0000 mg | ORAL_TABLET | Freq: Every day | ORAL | Status: DC

## 2016-06-02 MED ORDER — ATORVASTATIN CALCIUM 20 MG PO TABS
20.0000 mg | ORAL_TABLET | Freq: Every day | ORAL | Status: DC
  Administered 2016-06-03 – 2016-06-04 (×2): 20 mg via ORAL
  Filled 2016-06-02 (×3): qty 1

## 2016-06-02 MED ORDER — TIZANIDINE HCL 4 MG PO TABS
2.0000 mg | ORAL_TABLET | Freq: Two times a day (BID) | ORAL | Status: DC
  Administered 2016-06-03 – 2016-06-05 (×5): 2 mg via ORAL
  Filled 2016-06-02 (×6): qty 1

## 2016-06-02 MED ORDER — ALBUTEROL SULFATE HFA 108 (90 BASE) MCG/ACT IN AERS: 2.0000 | INHALATION_SPRAY | Freq: Four times a day (QID) | RESPIRATORY_TRACT | Status: DC | PRN

## 2016-06-02 MED ORDER — IPRATROPIUM-ALBUTEROL 0.5-2.5 (3) MG/3ML IN SOLN
3.0000 mL | Freq: Four times a day (QID) | RESPIRATORY_TRACT | Status: DC
  Administered 2016-06-03 (×4): 3 mL via RESPIRATORY_TRACT
  Filled 2016-06-02 (×4): qty 3

## 2016-06-02 MED ORDER — LIRAGLUTIDE 18 MG/3ML ~~LOC~~ SOPN
1.8000 mg | PEN_INJECTOR | Freq: Every day | SUBCUTANEOUS | Status: DC
Start: 2016-06-03 — End: 2016-06-05

## 2016-06-02 MED ORDER — ACETAMINOPHEN 500 MG PO TABS
500.0000 mg | ORAL_TABLET | Freq: Four times a day (QID) | ORAL | Status: DC | PRN
  Administered 2016-06-03 – 2016-06-05 (×3): 500 mg via ORAL
  Filled 2016-06-02 (×4): qty 1

## 2016-06-02 MED ORDER — INSULIN GLARGINE 100 UNIT/ML ~~LOC~~ SOLN
65.0000 [IU] | Freq: Every day | SUBCUTANEOUS | Status: DC
  Administered 2016-06-03 – 2016-06-04 (×2): 65 [IU] via SUBCUTANEOUS
  Filled 2016-06-02 (×3): qty 0.65

## 2016-06-02 MED ORDER — RISPERIDONE 1 MG PO TABS
1.5000 mg | ORAL_TABLET | Freq: Two times a day (BID) | ORAL | Status: DC
  Administered 2016-06-03: 1.5 mg via ORAL
  Filled 2016-06-02 (×3): qty 1

## 2016-06-02 MED ORDER — VENLAFAXINE HCL 75 MG PO TABS
150.0000 mg | ORAL_TABLET | Freq: Every day | ORAL | Status: DC
  Administered 2016-06-03 – 2016-06-04 (×2): 150 mg via ORAL
  Filled 2016-06-02 (×3): qty 2

## 2016-06-02 MED ORDER — CLONIDINE HCL 0.3 MG/24HR TD PTWK
0.3000 mg | MEDICATED_PATCH | TRANSDERMAL | Status: DC
  Filled 2016-06-02: qty 1

## 2016-06-02 MED ORDER — ALPRAZOLAM 1 MG PO TABS
1.0000 mg | ORAL_TABLET | Freq: Three times a day (TID) | ORAL | Status: DC
  Administered 2016-06-03: 1 mg via ORAL
  Filled 2016-06-02: qty 1

## 2016-06-02 MED ORDER — HYDRALAZINE HCL 25 MG PO TABS
25.0000 mg | ORAL_TABLET | Freq: Three times a day (TID) | ORAL | Status: DC
  Administered 2016-06-03 – 2016-06-05 (×7): 25 mg via ORAL
  Filled 2016-06-02 (×9): qty 1

## 2016-06-02 MED ORDER — ALBUTEROL SULFATE (2.5 MG/3ML) 0.083% IN NEBU
2.5000 mg | INHALATION_SOLUTION | Freq: Once | RESPIRATORY_TRACT | Status: AC
  Administered 2016-06-02: 2.5 mg via RESPIRATORY_TRACT
  Filled 2016-06-02: qty 3

## 2016-06-02 MED ORDER — VENLAFAXINE HCL 75 MG PO TABS
75.0000 mg | ORAL_TABLET | Freq: Every day | ORAL | Status: DC
  Administered 2016-06-03 – 2016-06-05 (×3): 75 mg via ORAL
  Filled 2016-06-02 (×3): qty 1

## 2016-06-02 MED ORDER — PREGABALIN 50 MG PO CAPS
50.0000 mg | ORAL_CAPSULE | Freq: Two times a day (BID) | ORAL | Status: DC
  Administered 2016-06-03 – 2016-06-05 (×5): 50 mg via ORAL
  Filled 2016-06-02 (×6): qty 1

## 2016-06-02 MED ORDER — INSULIN GLARGINE 100 UNIT/ML ~~LOC~~ SOLN
47.0000 [IU] | Freq: Every day | SUBCUTANEOUS | Status: DC
  Administered 2016-06-03 – 2016-06-05 (×3): 47 [IU] via SUBCUTANEOUS
  Filled 2016-06-02 (×3): qty 0.47

## 2016-06-02 MED ORDER — METOPROLOL SUCCINATE ER 100 MG PO TB24
200.0000 mg | ORAL_TABLET | Freq: Every day | ORAL | Status: DC
  Administered 2016-06-03 – 2016-06-05 (×3): 200 mg via ORAL
  Filled 2016-06-02 (×3): qty 2

## 2016-06-02 MED ORDER — LATANOPROST 0.005 % OP SOLN
1.0000 [drp] | Freq: Every day | OPHTHALMIC | Status: DC
  Administered 2016-06-03 – 2016-06-04 (×2): 1 [drp] via OPHTHALMIC
  Filled 2016-06-02: qty 2.5

## 2016-06-02 NOTE — ED Notes (Signed)
Per daughter's report , pt. Had recent changed of medication to xanax and increased in risperdal.

## 2016-06-02 NOTE — ED Notes (Signed)
Pt.'s daughter at bedside and informed this Nurse that pt.'s all night medicines were already given earlier this evening prior to coming to ED ,including lantus insulin and eye drops.

## 2016-06-02 NOTE — ED Notes (Signed)
Per daughter , pt. Uses CPAP for her sleep apnea .

## 2016-06-02 NOTE — ED Triage Notes (Signed)
Pt. Brought in by daughters from [facility] [place] with the complaint of paranoia and hallucinations. Pt. Alert and oriented x2, denied any pain . Denied SI nor HI. Pt. Admitted to hearing voices. Pt. Noted with inspiratory wheezes and daughters reported that she had her inhaler at around 2pm today.

## 2016-06-03 ENCOUNTER — Encounter (HOSPITAL_COMMUNITY): Payer: Self-pay | Admitting: Registered Nurse

## 2016-06-03 ENCOUNTER — Other Ambulatory Visit: Payer: Self-pay

## 2016-06-03 DIAGNOSIS — F22 Delusional disorders: Secondary | ICD-10-CM | POA: Diagnosis not present

## 2016-06-03 DIAGNOSIS — Z91010 Allergy to peanuts: Secondary | ICD-10-CM

## 2016-06-03 DIAGNOSIS — R441 Visual hallucinations: Secondary | ICD-10-CM | POA: Diagnosis not present

## 2016-06-03 DIAGNOSIS — R440 Auditory hallucinations: Secondary | ICD-10-CM | POA: Insufficient documentation

## 2016-06-03 DIAGNOSIS — Z794 Long term (current) use of insulin: Secondary | ICD-10-CM | POA: Diagnosis not present

## 2016-06-03 DIAGNOSIS — F251 Schizoaffective disorder, depressive type: Secondary | ICD-10-CM | POA: Diagnosis not present

## 2016-06-03 DIAGNOSIS — Z79899 Other long term (current) drug therapy: Secondary | ICD-10-CM | POA: Diagnosis not present

## 2016-06-03 LAB — CBG MONITORING, ED
Glucose-Capillary: 81 mg/dL (ref 65–99)
Glucose-Capillary: 139 mg/dL — ABNORMAL HIGH (ref 65–99)
Glucose-Capillary: 277 mg/dL — ABNORMAL HIGH (ref 65–99)

## 2016-06-03 MED ORDER — RISPERIDONE 2 MG PO TABS
2.0000 mg | ORAL_TABLET | Freq: Two times a day (BID) | ORAL | Status: DC
  Administered 2016-06-03 – 2016-06-05 (×4): 2 mg via ORAL
  Filled 2016-06-03 (×4): qty 1

## 2016-06-03 MED ORDER — IPRATROPIUM-ALBUTEROL 0.5-2.5 (3) MG/3ML IN SOLN
3.0000 mL | Freq: Three times a day (TID) | RESPIRATORY_TRACT | Status: DC
  Administered 2016-06-04 (×2): 3 mL via RESPIRATORY_TRACT
  Filled 2016-06-03 (×3): qty 3

## 2016-06-03 MED ORDER — LORAZEPAM 1 MG PO TABS
1.0000 mg | ORAL_TABLET | Freq: Four times a day (QID) | ORAL | Status: DC | PRN
  Filled 2016-06-03: qty 1

## 2016-06-03 MED ORDER — HYDROXYZINE HCL 25 MG PO TABS
25.0000 mg | ORAL_TABLET | Freq: Two times a day (BID) | ORAL | Status: DC
  Administered 2016-06-03 – 2016-06-05 (×4): 25 mg via ORAL
  Filled 2016-06-03 (×4): qty 1

## 2016-06-03 NOTE — ED Notes (Signed)
No respiratory or acute distress noted resting in bed with eyes closed call light in reach. 

## 2016-06-03 NOTE — BH Assessment (Signed)
Tele Assessment Note   Samantha [name] is a 71 y.o. female presents to [facility] voluntarily accompanied by her daughters.  Pt was asleep and not able to participate in assessment. However, pt's daughter Lucely Arber [phone]) was present and was able to give all collateral information. It was reported that pt's medications was changed within the past 2 weeks which has caused an increase in her paranoia and hallucinations. Pt currently resides at [facility] facility. It was stated that she accused her roommate of wanting to her harm or kill her. She also stated that staff members were trying to crawl through her window and kill her. This behavior is abnormal for pt per daughter and staff at [facility].   Pt has a past hx of being diagnosed with Bipolar and dementia. Pt's daughter reports she has been in several inpatient facilities with the last stay being at [facility] in 2014.  Pt has never been a threat to herself but tried to manage her Bipolar for over 30 years with ongoing treatment and medications.   Pt's daughter currently denies S/I, H/I, self-injurious behaviors, depressive symptoms and substance use.   Pt's daughter states pt would be safe to return to facility due to there not being any weapons and she would be monitored 24/7.  However, the concern is regulating her back on the appropriate medications so that her symptoms do not continue to increase and she will remain safe and not harm herself or others by accident.   Pt was dressed in hospital scrubs. Therapist was unable to assess mood, thought process, orientation, and overall presentation due to pt being asleep.   Diagnosis: Bipolar w/ psychotic features  Past Medical History:  Past Medical History:  Diagnosis Date  . Anemia   . Bipolar 1 disorder ([place])   . CHF (congestive heart failure) ([place])   . Depression   . Diabetes mellitus without complication ([place])   . Hypertension   . Neuropathy ([place])   . Stroke [facility])      Past Surgical History:  Procedure Laterality Date  . CESAREAN SECTION      Family History: History reviewed. No pertinent family history.  Social History:  reports that she has never smoked. She does not have any smokeless tobacco history on file. She reports that she does not drink alcohol. Her drug history is not on file.  Additional Social History:  Alcohol / Drug Use Pain Medications: See MAR  Prescriptions: See MAR Over the Counter: See MAR  History of alcohol / drug use?: No history of alcohol / drug abuse  CIWA: CIWA-Ar BP: 148/84 Pulse Rate: 95 COWS:    PATIENT STRENGTHS: (choose at least two) Communication skills General fund of knowledge Motivation for treatment/growth  Allergies:  Allergies  Allergen Reactions  . Peanut-Containing Drug Products     Home Medications:  (Not in a hospital admission)  OB/GYN Status:  No LMP recorded. Patient is postmenopausal.  General Assessment Data Location of Assessment: [facility] ED TTS Assessment: In system Is this a Tele or Face-to-Face Assessment?: Face-to-Face Is this an Initial Assessment or a Re-assessment for this encounter?: Initial Assessment Is patient pregnant?: No Pregnancy Status: No Living Arrangements: Other (Comment) (pt lives in an assisted living facility ) Can pt return to current living arrangement?: Yes Admission Status: Voluntary Is patient capable of signing voluntary admission?: Yes Referral Source: Self/Family/Friend Insurance type: Medicare  Medical Screening Exam ([facility]) Medical Exam completed: Yes  Crisis Care Plan Living Arrangements: Other (Comment) (  pt lives in an assisted living facility ) Name of Psychiatrist: [facility] facility Name of Therapist: [facility] facility  Education Status Is patient currently in school?: No Current Grade: na Highest grade of school patient has completed: na Name of school: na Contact person: na  Risk to self with the past 6  months Suicidal Ideation: No Has patient been a risk to self within the past 6 months prior to admission? : No Suicidal Intent: No Has patient had any suicidal intent within the past 6 months prior to admission? : No Is patient at risk for suicide?: No Suicidal Plan?: No Has patient had any suicidal plan within the past 6 months prior to admission? : No Access to Means: No What has been your use of drugs/alcohol within the last 12 months?: na Previous Attempts/Gestures: No How many times?: 0 Other Self Harm Risks: denies  Triggers for Past Attempts: None known Intentional Self Injurious Behavior: None Family Suicide History: Unknown Recent stressful life event(s): Other (Comment) (adjustment in medications) Persecutory voices/beliefs?: No Depression: Yes Depression Symptoms: Insomnia, Feeling angry/irritable Substance abuse history and/or treatment for substance abuse?: No Suicide prevention information given to non-admitted patients: Not applicable  Risk to Others within the past 6 months Homicidal Ideation: No Does patient have any lifetime risk of violence toward others beyond the six months prior to admission? : No Thoughts of Harm to Others: No Current Homicidal Intent: No Current Homicidal Plan: No Access to Homicidal Means: No Identified Victim: na History of harm to others?: No Assessment of Violence: None Noted Violent Behavior Description: na Does patient have access to weapons?: No Criminal Charges Pending?: No Does patient have a court date: No Is patient on probation?: No  Psychosis Hallucinations: Auditory, Visual Delusions: Unspecified  Mental Status Report Appearance/Hygiene: In scrubs Eye Contact: Unable to Assess Motor Activity: Unable to assess Speech: Unable to assess Level of Consciousness: Unable to assess Affect: Unable to Assess Anxiety Level: None Thought Processes: Unable to Assess Judgement: Unable to Assess Orientation: Unable to  assess Obsessive Compulsive Thoughts/Behaviors: Unable to Assess  Cognitive Functioning Concentration: Unable to Assess Memory: Unable to Assess IQ: Average Insight: Unable to Assess Impulse Control: Unable to Assess Appetite: Fair Weight Loss: 0 Weight Gain: 0 Sleep: Unable to Assess Total Hours of Sleep: 0 Vegetative Symptoms: None  ADLScreening [facility] Assessment Services) Patient's cognitive ability adequate to safely complete daily activities?: Yes Patient able to express need for assistance with ADLs?: Yes Independently performs ADLs?: Yes (appropriate for developmental age)  Prior Inpatient Therapy Prior Inpatient Therapy: Yes Prior Therapy Dates: mulitple; last [facility] 2014 Prior Therapy Facilty/Provider(s): currently Reason for Treatment: halluciantions  Prior Outpatient Therapy Prior Outpatient Therapy: Yes Prior Therapy Dates: currently Prior Therapy Facilty/Provider(s): [facility] Reason for Treatment: hallucinations; paranoia Does patient have an ACCT team?: No Does patient have Intensive In-House Services?  : No Does patient have [facility] services? : No Does patient have P4CC services?: No  ADL Screening (condition at time of admission) Patient's cognitive ability adequate to safely complete daily activities?: Yes Is the patient deaf or have difficulty hearing?: No Does the patient have difficulty seeing, even when wearing glasses/contacts?: No Does the patient have difficulty concentrating, remembering, or making decisions?: No Patient able to express need for assistance with ADLs?: Yes Does the patient have difficulty dressing or bathing?: No Independently performs ADLs?: Yes (appropriate for developmental age) Does the patient have difficulty walking or climbing stairs?: No       Abuse/Neglect Assessment (Assessment to be complete  while patient is alone) Physical Abuse: Denies Verbal Abuse: Denies Sexual Abuse: Denies Exploitation of patient/patient's  resources: Denies Self-Neglect: Denies Values / Beliefs Cultural Requests During Hospitalization: None Spiritual Requests During Hospitalization: None   Advance Directives (For Healthcare) Does Patient Have a Medical Advance Directive?: No Would patient like information on creating a medical advance directive?: No - Patient declined    Additional Information 1:1 In Past 12 Months?: No CIRT Risk: No Elopement Risk: No Does patient have medical clearance?: Yes     Disposition: Gave clinical report to Lindon Romp, [place] who recommends pt be seen by psychiatry in the morning for an evaluation.     Caryl Pina n Linley Moskal 06/03/2016 12:30 AM

## 2016-06-03 NOTE — ED Notes (Signed)
No respiratory or acute distress noted resting in bed call light in reach alert and oriented.

## 2016-06-03 NOTE — Progress Notes (Signed)
Felicia from [employer] called and inquired if pt still needed placement. Writer informed that pt needs placement and Felicia informed that referral will now be reviewed by the doctor.  Verlon Setting, [place] Disposition staff 06/03/2016 3:06 PM

## 2016-06-03 NOTE — Consult Note (Signed)
[facility] Consult   Reason for Consult:  Psychosis Referring Physician:  EDP Patient Identification: Samantha [name] MRN:  [id number] Principal Diagnosis: Schizoaffective disorder, depressive type [facility]) Diagnosis:   Patient Active Problem List   Diagnosis Date Noted  . Schizoaffective disorder, depressive type ([place]) [F25.1] 06/03/2016  . Hyperglycemia [R73.9] 08/31/2013  . Bipolar 1 disorder, depressed ([place]) [F31.9] 08/29/2013  . Psychosis [F29] 08/29/2013  . Bipolar 1 disorder ([place]) [F31.9] 08/29/2013    Total Time spent with patient: 45 minutes  Subjective:   Samantha [name] is a 71 y.o. female patient presented to [facility] with complaints of patient having hallucinations and paranoia since changes in her medications  HPI:  Samantha [name] 71 y.o. female patient seen by Dr. Darleene Cleaver and this provider.  Chart reviewed 06/03/16.   On evaluation:  Samantha [name] has her daughter by her bedside reports that reports that the resides in [facility] and patient recently had some mediation changes starting Xanax and increasing Risperdal.  After the medication changes patient started to have auditory/visual hallucinations, and paranoia; thinking her room mate was trying to harm her and that some one was peeping thought the peep hole of door.  Patient continues to endorse auditory/visual hallucinations "I see shadows and hear voices; but I don't understand what they are saying."     Past Psychiatric History: Schizoaffective disorder; Bipolar disorder, and psychosis.  Patient reports "I was on Haldol all my life and did good on it too."  Risk to Self: Suicidal Ideation: No Suicidal Intent: No Is patient at risk for suicide?: No Suicidal Plan?: No Access to Means: No What has been your use of drugs/alcohol within the last 12 months?: na How many times?: 0 Other Self Harm Risks: denies  Triggers for Past Attempts: None known Intentional Self Injurious Behavior:  None Risk to Others: Homicidal Ideation: No Thoughts of Harm to Others: No Current Homicidal Intent: No Current Homicidal Plan: No Access to Homicidal Means: No Identified Victim: na History of harm to others?: No Assessment of Violence: None Noted Violent Behavior Description: na Does patient have access to weapons?: No Criminal Charges Pending?: No Does patient have a court date: No Prior Inpatient Therapy: Prior Inpatient Therapy: Yes Prior Therapy Dates: mulitple; last [facility] 2014 Prior Therapy Facilty/Provider(s): currently Reason for Treatment: halluciantions Prior Outpatient Therapy: Prior Outpatient Therapy: Yes Prior Therapy Dates: currently Prior Therapy Facilty/Provider(s): [place] Reason for Treatment: hallucinations; paranoia Does patient have an ACCT team?: No Does patient have Intensive In-House Services?  : No Does patient have [facility] services? : No Does patient have P4CC services?: No  Past Medical History:  Past Medical History:  Diagnosis Date  . Anemia   . Bipolar 1 disorder ([place])   . CHF (congestive heart failure) ([place])   . Depression   . Diabetes mellitus without complication ([place])   . Hypertension   . Neuropathy ([place])   . Stroke [facility])     Past Surgical History:  Procedure Laterality Date  . CESAREAN SECTION     Family History: History reviewed. No pertinent family history. Family Psychiatric  History: Denies Social History:  History  Alcohol Use No     History  Drug use: Unknown    Social History   Social History  . Marital status: Single    Spouse name: N/A  . Number of children: N/A  . Years of education: N/A   Social History Main Topics  . Smoking status: Never Smoker  . Smokeless tobacco: None  .  Alcohol use No  . Drug use: Unknown  . Sexual activity: Not Asked   Other Topics Concern  . None   Social History Narrative  . None   Additional Social History:    Allergies:   Allergies  Allergen Reactions  .  Peanut-Containing Drug Products     Labs:  Results for orders placed or performed during the hospital encounter of 06/02/16 (from the past 48 hour(s))  Urinalysis, Routine w reflex microscopic     Status: Abnormal   Collection Time: 06/02/16  8:23 PM  Result Value Ref Range   Color, Urine STRAW (A) YELLOW   APPearance CLEAR CLEAR   Specific Gravity, Urine 1.006 1.005 - 1.030   pH 5.0 5.0 - 8.0   Glucose, UA NEGATIVE NEGATIVE mg/dL   Hgb urine dipstick NEGATIVE NEGATIVE   Bilirubin Urine NEGATIVE NEGATIVE   Ketones, ur NEGATIVE NEGATIVE mg/dL   Protein, ur NEGATIVE NEGATIVE mg/dL   Nitrite NEGATIVE NEGATIVE   Leukocytes, UA NEGATIVE NEGATIVE  Urine rapid drug screen (hosp performed)not at [facility]     Status: Abnormal   Collection Time: 06/02/16  8:23 PM  Result Value Ref Range   Opiates NONE DETECTED NONE DETECTED   Cocaine NONE DETECTED NONE DETECTED   Benzodiazepines POSITIVE (A) NONE DETECTED   Amphetamines NONE DETECTED NONE DETECTED   Tetrahydrocannabinol NONE DETECTED NONE DETECTED   Barbiturates NONE DETECTED NONE DETECTED    Comment:        DRUG SCREEN FOR MEDICAL PURPOSES ONLY.  IF CONFIRMATION IS NEEDED FOR ANY PURPOSE, NOTIFY LAB WITHIN 5 DAYS.        LOWEST DETECTABLE LIMITS FOR URINE DRUG SCREEN Drug Class       Cutoff (ng/mL) Amphetamine      1000 Barbiturate      200 Benzodiazepine   903 Tricyclics       009 Opiates          300 Cocaine          300 THC              50   Comprehensive metabolic panel     Status: Abnormal   Collection Time: 06/02/16  8:40 PM  Result Value Ref Range   Sodium 132 (L) 135 - 145 mmol/L   Potassium 4.3 3.5 - 5.1 mmol/L   Chloride 104 101 - 111 mmol/L   CO2 21 (L) 22 - 32 mmol/L   Glucose, Bld 189 (H) 65 - 99 mg/dL   BUN 29 (H) 6 - 20 mg/dL   Creatinine, Ser 1.66 (H) 0.44 - 1.00 mg/dL   Calcium 10.1 8.9 - 10.3 mg/dL   Total Protein 6.4 (L) 6.5 - 8.1 g/dL   Albumin 3.5 3.5 - 5.0 g/dL   AST 18 15 - 41 U/L   ALT 19 14 - 54  U/L   Alkaline Phosphatase 71 38 - 126 U/L   Total Bilirubin <0.1 (L) 0.3 - 1.2 mg/dL   GFR calc non Af Amer 30 (L) >60 mL/min   GFR calc Af Amer 35 (L) >60 mL/min    Comment: (NOTE) The eGFR has been calculated using the CKD EPI equation. This calculation has not been validated in all clinical situations. eGFR's persistently <60 mL/min signify possible Chronic Kidney Disease.    Anion gap 7 5 - 15  Ethanol     Status: None   Collection Time: 06/02/16  8:40 PM  Result Value Ref Range   Alcohol, Ethyl (B) <5 <5  mg/dL    Comment:        LOWEST DETECTABLE LIMIT FOR SERUM ALCOHOL IS 5 mg/dL FOR MEDICAL PURPOSES ONLY   CBG monitoring, ED     Status: Abnormal   Collection Time: 06/02/16 11:47 PM  Result Value Ref Range   Glucose-Capillary 103 (H) 65 - 99 mg/dL  CBG monitoring, ED     Status: None   Collection Time: 06/03/16  7:36 AM  Result Value Ref Range   Glucose-Capillary 81 65 - 99 mg/dL    Current Facility-Administered Medications  Medication Dose Route Frequency Provider Last Rate Last Dose  . acetaminophen (TYLENOL) tablet 500 mg  500 mg Oral Q6H PRN Blanchie Dessert, MD      . albuterol (PROVENTIL HFA;VENTOLIN HFA) 108 (90 Base) MCG/ACT inhaler 2 puff  2 puff Inhalation Q6H PRN Blanchie Dessert, MD      . atorvastatin (LIPITOR) tablet 20 mg  20 mg Oral QHS Blanchie Dessert, MD      . cloNIDine (CATAPRES - Dosed in mg/24 hr) patch 0.3 mg  0.3 mg Transdermal Weekly Blanchie Dessert, MD      . hydrALAZINE (APRESOLINE) tablet 25 mg  25 mg Oral TID Blanchie Dessert, MD   25 mg at 06/03/16 1141  . hydrOXYzine (ATARAX/VISTARIL) tablet 25 mg  25 mg Oral BID PC Imberly Troxler, MD      . insulin glargine (LANTUS) injection 47 Units  47 Units Subcutaneous Daily Blanchie Dessert, MD   47 Units at 06/03/16 1143  . insulin glargine (LANTUS) injection 65 Units  65 Units Subcutaneous QHS Whitney Plunkett, MD      . ipratropium-albuterol (DUONEB) 0.5-2.5 (3) MG/3ML nebulizer solution 3 mL   3 mL Nebulization Q6H Blanchie Dessert, MD   3 mL at 06/03/16 1010  . latanoprost (XALATAN) 0.005 % ophthalmic solution 1 drop  1 drop Both Eyes QHS Whitney Plunkett, MD      . liraglutide SOPN 1.8 mg  1.8 mg Subcutaneous Q1200 Blanchie Dessert, MD      . LORazepam (ATIVAN) tablet 1 mg  1 mg Oral Q6H PRN Kymberley Raz, MD      . metoprolol succinate (TOPROL-XL) 24 hr tablet 200 mg  200 mg Oral Daily Blanchie Dessert, MD   200 mg at 06/03/16 1140  . pregabalin (LYRICA) capsule 50 mg  50 mg Oral BID Blanchie Dessert, MD   50 mg at 06/03/16 1151  . risperiDONE (RISPERDAL) tablet 2 mg  2 mg Oral BID Tashana Haberl, MD      . tiZANidine (ZANAFLEX) tablet 2 mg  2 mg Oral BID Blanchie Dessert, MD   2 mg at 06/03/16 1150  . venlafaxine (EFFEXOR) tablet 150 mg  150 mg Oral QHS Blanchie Dessert, MD      . venlafaxine [facility]) tablet 75 mg  75 mg Oral Daily Blanchie Dessert, MD   75 mg at 06/03/16 1141   Current Outpatient Prescriptions  Medication Sig Dispense Refill  . acetaminophen (TYLENOL) 500 MG tablet Take 500 mg by mouth every 6 (six) hours as needed for mild pain.    Marland Kitchen albuterol (PROVENTIL HFA;VENTOLIN HFA) 108 (90 BASE) MCG/ACT inhaler Inhale 2 puffs into the lungs every 6 (six) hours as needed for wheezing. 1 Inhaler 7  . ALPRAZolam (XANAX) 1 MG tablet Take 1 mg by mouth 3 (three) times daily.    Marland Kitchen atorvastatin (LIPITOR) 20 MG tablet Take 20 mg by mouth at bedtime.     . cloNIDine (CATAPRES - DOSED IN MG/24 HR)  0.3 mg/24hr patch Place 1 patch onto the skin once a week.    . hydrALAZINE (APRESOLINE) 25 MG tablet Take 25 mg by mouth 3 (three) times daily.    . hydrocortisone cream 1 % Apply 1 application topically 2 (two) times daily as needed for itching. For hemorrhoids    . insulin glargine (LANTUS) 100 unit/mL SOPN Inject 10 U in the AM and 80 U in the PM (Patient taking differently: Inject 47-65 Units into the skin 2 (two) times daily. Inject 47 unitsin the AM and 65 units in the PM) 15  mL 11  . latanoprost (XALATAN) 0.005 % ophthalmic solution Place 1 drop into both eyes at bedtime.    . Liraglutide (VICTOZA) 18 MG/3ML SOPN Inject 1.8 mg into the skin daily at 12 noon. 1 pen 7  . Melatonin 10 MG TABS Take 10 mg by mouth at bedtime.     . metoprolol (TOPROL-XL) 200 MG 24 hr tablet Take 200 mg by mouth daily.    . pregabalin (LYRICA) 50 MG capsule Take 50 mg by mouth 2 (two) times daily.    Marland Kitchen Propylene Glycol (SYSTANE BALANCE) 0.6 % SOLN Place 1 drop into both eyes 3 (three) times daily with meals.    . risperiDONE (RISPERDAL) 1 MG tablet Take 1.5 mg by mouth 2 (two) times daily.    Marland Kitchen tiZANidine (ZANAFLEX) 2 MG tablet Take 2 mg by mouth 2 (two) times daily.     Marland Kitchen venlafaxine (EFFEXOR) 75 MG tablet Take 75-150 mg by mouth 2 (two) times daily with a meal. Take 1 in the morning Take 2 at bedtime      Musculoskeletal: Strength & Muscle Tone: within normal limits Gait & Station: Patient in bed during time of assessment; did not see ambulate Patient leans: N/A  Psychiatric Specialty Exam: Physical Exam  Nursing note and vitals reviewed. Constitutional: She is oriented to person, place, and time.  Neck: Normal range of motion.  Respiratory: Effort normal.  Musculoskeletal: Normal range of motion.  Neurological: She is alert and oriented to person, place, and time.    Review of Systems  Psychiatric/Behavioral: Positive for depression and hallucinations. Negative for substance abuse and suicidal ideas. The patient is nervous/anxious and has insomnia.   All other systems reviewed and are negative.   Blood pressure 147/68, pulse 68, temperature 97.9 F (36.6 C), temperature source Axillary, resp. rate 18, SpO2 100 %.There is no height or weight on file to calculate BMI.  General Appearance: Fairly Groomed  Eye Contact:  Good  Speech:  Clear and Coherent and Normal Rate  Volume:  Normal  Mood:  Depressed  Affect:  Depressed  Thought Process:  Goal Directed  Orientation:   Full (Time, Place, and Person)  Thought Content:  Hallucinations: Auditory Visual  Suicidal Thoughts:  No  Homicidal Thoughts:  No  Memory:  Immediate;   Poor Recent;   Poor Remote;   Poor  Judgement:  Impaired  Insight:  Fair  Psychomotor Activity:  Decreased  Concentration:  Concentration: Fair and Attention Span: Fair  Recall:  Poor  Fund of Knowledge:  Fair  Language:  Fair  Akathisia:  No  Handed:  Right  AIMS (if indicated):     Assets:  Communication Skills Housing Social Support  ADL's:  Intact  Cognition:  WNL  Sleep:        Treatment Plan Summary: Daily contact with patient to assess and evaluate symptoms and progress in treatment and Medication management  Disposition:  Recommend psychiatric Inpatient admission when medically cleared. (Geropsychiatry)   Earleen Newport, NP 06/03/2016 2:21 PM  Patient seen face-to-face for psychiatric evaluation, chart reviewed and case discussed with the physician extender and developed treatment plan. Reviewed the information documented and agree with the treatment plan. Corena Pilgrim, MD

## 2016-06-03 NOTE — ED Notes (Signed)
Dr. Maryan Rued notified of meds not given, pt. Was medicated prior to coming to ED last night .

## 2016-06-03 NOTE — ED Provider Notes (Signed)
[facility] DEPT Provider Note   CSN: [id number] Arrival date & time: 06/02/16  1909     History   Chief Complaint Chief Complaint  Patient presents with  . Paranoid  . Hallucinations    HPI Samantha Figuero is a 71 y.o. female.  Patient is a 71 year old female with a history of bipolar disease, dementia, diabetes, CHF and lung disease presenting today for worsening paranoia. Patient lives in assisted living complex imperforate family over the last 2 weeks as had worsening paranoia. 2 weeks ago she was placed on Xanax and Risperdal was increased without significant improvement. Patient has been thinking that people are going to get her. She is having auditory and visual hallucinations of people trying to break down her door, please her, chase her through the streets. She thought her roommate was calling a hit it on her. Family states she has never tried to hurt herself in the recent past and has not been violent with staff for her roommate. She is taking her medications but they don't seem to be helping. Family states that she does have a history of bad lung disease and uses CPAP at night and albuterol but feel that her breathing is at its baseline.   The history is provided by a relative.    Past Medical History:  Diagnosis Date  . Anemia   . Bipolar 1 disorder ([place])   . CHF (congestive heart failure) ([place])   . Depression   . Diabetes mellitus without complication ([place])   . Hypertension   . Neuropathy ([place])   . Stroke [facility])     Patient Active Problem List   Diagnosis Date Noted  . Hyperglycemia 08/31/2013  . Bipolar 1 disorder, depressed ([place]) 08/29/2013  . Psychosis 08/29/2013  . Bipolar 1 disorder ([place]) 08/29/2013    Past Surgical History:  Procedure Laterality Date  . CESAREAN SECTION      OB History    No data available       Home Medications    Prior to Admission medications   Medication Sig Start Date End Date Taking? Authorizing Provider    acetaminophen (TYLENOL) 500 MG tablet Take 500 mg by mouth every 6 (six) hours as needed for mild pain.   Yes Historical Provider, MD  albuterol (PROVENTIL HFA;VENTOLIN HFA) 108 (90 BASE) MCG/ACT inhaler Inhale 2 puffs into the lungs every 6 (six) hours as needed for wheezing. 09/02/13  Yes Theodis Blaze, MD  ALPRAZolam Duanne Moron) 1 MG tablet Take 1 mg by mouth 3 (three) times daily.   Yes Historical Provider, MD  atorvastatin (LIPITOR) 20 MG tablet Take 20 mg by mouth at bedtime.    Yes Historical Provider, MD  cloNIDine (CATAPRES - DOSED IN MG/24 HR) 0.3 mg/24hr patch Place 1 patch onto the skin once a week.   Yes Historical Provider, MD  hydrALAZINE (APRESOLINE) 25 MG tablet Take 25 mg by mouth 3 (three) times daily. 09/08/15  Yes Historical Provider, MD  hydrocortisone cream 1 % Apply 1 application topically 2 (two) times daily as needed for itching. For hemorrhoids   Yes Historical Provider, MD  insulin glargine (LANTUS) 100 unit/mL SOPN Inject 10 U in the AM and 80 U in the PM Patient taking differently: Inject 47-65 Units into the skin 2 (two) times daily. Inject 47 unitsin the AM and 65 units in the PM 09/02/13  Yes Theodis Blaze, MD  latanoprost (XALATAN) 0.005 % ophthalmic solution Place 1 drop into both eyes at bedtime. 08/30/15  Yes  Historical Provider, MD  Liraglutide (VICTOZA) 18 MG/3ML SOPN Inject 1.8 mg into the skin daily at 12 noon. 09/02/13  Yes Theodis Blaze, MD  Melatonin 10 MG TABS Take 10 mg by mouth at bedtime.    Yes Historical Provider, MD  metoprolol (TOPROL-XL) 200 MG 24 hr tablet Take 200 mg by mouth daily.   Yes Historical Provider, MD  pregabalin (LYRICA) 50 MG capsule Take 50 mg by mouth 2 (two) times daily.   Yes Historical Provider, MD  Propylene Glycol (SYSTANE BALANCE) 0.6 % SOLN Place 1 drop into both eyes 3 (three) times daily with meals.   Yes Historical Provider, MD  risperiDONE (RISPERDAL) 1 MG tablet Take 1.5 mg by mouth 2 (two) times daily.   Yes Historical  Provider, MD  tiZANidine (ZANAFLEX) 2 MG tablet Take 2 mg by mouth 2 (two) times daily.  09/08/15  Yes Historical Provider, MD  venlafaxine (EFFEXOR) 75 MG tablet Take 75-150 mg by mouth 2 (two) times daily with a meal. Take 1 in the morning Take 2 at bedtime   Yes Historical Provider, MD    Family History History reviewed. No pertinent family history.  Social History Social History  Substance Use Topics  . Smoking status: Never Smoker  . Smokeless tobacco: Not on file  . Alcohol use No     Allergies   Peanut-containing drug products   Review of Systems Review of Systems  Respiratory: Positive for wheezing. Negative for shortness of breath.   Cardiovascular: Negative for chest pain and leg swelling.  All other systems reviewed and are negative.    Physical Exam Updated Vital Signs BP 148/84   Pulse 95   Temp 98.7 F (37.1 C) (Oral)   Resp 18   SpO2 96%   Physical Exam  Constitutional: She is oriented to person, place, and time. She appears well-developed and well-nourished. No distress.  Currently sleeping but will wake up  HENT:  Head: Normocephalic and atraumatic.  Eyes: EOM are normal. Pupils are equal, round, and reactive to light.  Fundoscopic exam:      The right eye shows no papilledema.       The left eye shows no papilledema.  Neck: Normal range of motion. Neck supple.  Cardiovascular: Normal rate, regular rhythm, normal heart sounds and intact distal pulses.  Exam reveals no friction rub.   No murmur heard. Pulmonary/Chest: Effort normal. She has wheezes. She has no rales.  Diffuse mild expiratory wheezes  Abdominal: Soft. Bowel sounds are normal. She exhibits no distension. There is no tenderness. There is no rebound and no guarding.  Musculoskeletal: Normal range of motion. She exhibits no tenderness.  No edema  Lymphadenopathy:    She has no cervical adenopathy.  Neurological: She is alert and oriented to person, place, and time. She has normal  strength. No cranial nerve deficit or sensory deficit. Gait normal.  photophobia  Skin: Skin is warm and dry. No rash noted.  Psychiatric: She is actively hallucinating. Thought content is paranoid. She expresses no homicidal and no suicidal ideation.  Patient is sleeping so most of the history comes from her family  Nursing note and vitals reviewed.    ED Treatments / Results  Labs (all labs ordered are listed, but only abnormal results are displayed) Labs Reviewed  COMPREHENSIVE METABOLIC PANEL - Abnormal; Notable for the following:       Result Value   Sodium 132 (*)    CO2 21 (*)    Glucose, Bld  189 (*)    BUN 29 (*)    Creatinine, Ser 1.66 (*)    Total Protein 6.4 (*)    Total Bilirubin <0.1 (*)    GFR calc non Af Amer 30 (*)    GFR calc Af Amer 35 (*)    All other components within normal limits  URINALYSIS, ROUTINE W REFLEX MICROSCOPIC - Abnormal; Notable for the following:    Color, Urine STRAW (*)    All other components within normal limits  RAPID URINE DRUG SCREEN, HOSP PERFORMED - Abnormal; Notable for the following:    Benzodiazepines POSITIVE (*)    All other components within normal limits  CBG MONITORING, ED - Abnormal; Notable for the following:    Glucose-Capillary 103 (*)    All other components within normal limits  ETHANOL  CBC WITH DIFFERENTIAL/PLATELET    EKG  EKG Interpretation None       Radiology Dg Chest Port 1 View  Result Date: 06/02/2016 CLINICAL DATA:  Wheezing.  History of CHF, hypertension.  Nonsmoker. EXAM: PORTABLE CHEST 1 VIEW COMPARISON:  Chest radiograph September 12, 2015 FINDINGS: Cardiomediastinal silhouette is normal. Mild bronchitic changes. Strandy density LEFT lung base. Slight blunting of the LEFT costophrenic angle. No pneumothorax. Large body habitus. Osseous structures are nonsuspicious. IMPRESSION: Mild bronchitic changes. LEFT lung base atelectasis. Trace LEFT pleural effusion versus pleural thickening. Electronically  Signed   By: Elon Alas M.D.   On: 06/02/2016 21:05    Procedures Procedures (including critical care time)  Medications Ordered in ED Medications  ipratropium-albuterol (DUONEB) 0.5-2.5 (3) MG/3ML nebulizer solution 3 mL (3 mLs Nebulization Not Given 06/02/16 2157)  acetaminophen (TYLENOL) tablet 500 mg (not administered)  albuterol (PROVENTIL HFA;VENTOLIN HFA) 108 (90 Base) MCG/ACT inhaler 2 puff (not administered)  atorvastatin (LIPITOR) tablet 20 mg (not administered)  cloNIDine (CATAPRES - Dosed in mg/24 hr) patch 0.3 mg (not administered)  hydrALAZINE (APRESOLINE) tablet 25 mg (not administered)  insulin glargine (LANTUS) injection 65 Units (not administered)  latanoprost (XALATAN) 0.005 % ophthalmic solution 1 drop (not administered)  liraglutide SOPN 1.8 mg (not administered)  metoprolol succinate (TOPROL-XL) 24 hr tablet 200 mg (not administered)  pregabalin (LYRICA) capsule 50 mg (not administered)  tiZANidine (ZANAFLEX) tablet 2 mg (not administered)  venlafaxine (EFFEXOR) tablet 150 mg (not administered)  risperiDONE (RISPERDAL) tablet 1.5 mg (not administered)  ALPRAZolam (XANAX) tablet 1 mg (not administered)  venlafaxine (EFFEXOR) tablet 75 mg (not administered)  insulin glargine (LANTUS) injection 47 Units (not administered)  albuterol (PROVENTIL) (2.5 MG/3ML) 0.083% nebulizer solution 2.5 mg (2.5 mg Nebulization Given 06/02/16 2135)     Initial Impression / Assessment and Plan / ED Course  I have reviewed the triage vital signs and the nursing notes.  Pertinent labs & imaging results that were available during my care of the patient were reviewed by me and considered in my medical decision making (see chart for details).    Patient presenting today for worsening paranoia despite having increase in her Risperdal and starting Xanax. Family is concerned that she now she even thinks her roommate is going to kill her. Patient does have multiple medical problems but  they have. To be stable. She does wear CPAP at night and is given albuterol and Atrovent intermittently. Labs without significant findings today and feel that patient is medically clear. Will have TTS evaluate.  Final Clinical Impressions(s) / ED Diagnoses   Final diagnoses:  Paranoia ([place])  Auditory hallucination  Visual hallucinations    New Prescriptions  New Prescriptions   No medications on file     Blanchie Dessert, MD 06/03/16 [phone]

## 2016-06-03 NOTE — Progress Notes (Signed)
Patient was referred for inpatient geriatric treatment at: [facility] (geriatric), [facility], [place], [place], [place], [place], [place].  [place]lon Setting, [place] Disposition staff 06/03/2016 2:09 PM

## 2016-06-03 NOTE — ED Notes (Signed)
No respiratory or acute distress noted alert and oriented call light in reach no reaction to medication noted. 

## 2016-06-04 DIAGNOSIS — F22 Delusional disorders: Secondary | ICD-10-CM | POA: Diagnosis not present

## 2016-06-04 LAB — CBG MONITORING, ED
GLUCOSE-CAPILLARY: 194 mg/dL — AB (ref 65–99)
GLUCOSE-CAPILLARY: 274 mg/dL — AB (ref 65–99)
Glucose-Capillary: 67 mg/dL (ref 65–99)

## 2016-06-04 MED ORDER — IOPAMIDOL (ISOVUE-300) INJECTION 61%
INTRAVENOUS | Status: AC
  Filled 2016-06-04: qty 100

## 2016-06-04 MED ORDER — IPRATROPIUM-ALBUTEROL 0.5-2.5 (3) MG/3ML IN SOLN
3.0000 mL | Freq: Two times a day (BID) | RESPIRATORY_TRACT | Status: DC
  Administered 2016-06-05: 3 mL via RESPIRATORY_TRACT
  Filled 2016-06-04: qty 3

## 2016-06-04 NOTE — ED Notes (Signed)
No respiratory or acute distress noted alert and talking confused at times no reaction to medication noted call light in reach. Changed pt and bed due to urine leaking out of bedpan no skin breakdown noted pulled pt up in bed for comfort. No pain voiced.

## 2016-06-04 NOTE — ED Notes (Signed)
RT here for breathing treatments, family remains at the bedside

## 2016-06-04 NOTE — ED Notes (Signed)
No respiratory or acute distress noted resting in bed with eyes closed call light in reach no reaction to medication noted skin warm and dry to touch.

## 2016-06-04 NOTE — ED Notes (Signed)
Took vitals

## 2016-06-04 NOTE — ED Notes (Signed)
Pt verbally aggressive with staff and cursing; pt easily redirectable

## 2016-06-04 NOTE — Progress Notes (Signed)
CSW faxed patient's IVC paperwork to [facility] office. Magistrate office reported that IVC did not contain a sufficient amount of information to IVC patient. Patient notified staff at [facility]. Staff reported that they would hold patient's bed until tomorrow 06/05/2016. CSW notified EDP and provided patient's EDP with new IVC paperwork to fill out.  CSW notified TTS that patient's IVC paperwork needs to be completed, notarized and faxed [place] office.   Patient needs IVC to be accepted for placement at [facility].

## 2016-06-04 NOTE — BH Assessment (Signed)
[place] Assessment Progress Note This Probation officer spoke with patient this date to assess current mental status and discuss treatment progress. Patient presents with a disorganized affect and does not respond to this writer's questions. Patient did not seem to be time/place oriented as evidenced by patient not responding to this writer's questions.Patient is difficult to redirect and is observed to have a fixed stare. Collateral from staff reports patient refused medications on 06/03/16 and has not been cooperating with staff. Case was staffed with Akintayo MD who recommend continued inpatient as appropriate bed placement is investigated.

## 2016-06-04 NOTE — ED Notes (Signed)
Pt's family took all of patients belongings home with them

## 2016-06-04 NOTE — Progress Notes (Signed)
CSW contacted [facility] to follow up with patient's referral. CSW spoke with staff named [place]. Staff requested information, CSW provided requested information. Staff agreed to contact CSW after checking bed availability in [place]. CSW provided staff with contact information.

## 2016-06-04 NOTE — Progress Notes (Addendum)
Referral reviewed and pre-accepted at [place], per [place]. Shirlee Limerick advised that patient can be accepted at [facility] if patient can be IVC'd due to psychotic symptoms. [facility] RN Zenia Resides was notified.  Please fax the IVC papers to [facility] at fax#: [phone].  [facility] RN and SW aware.   Verlon Setting, [place] Social Worker, [facility] [phone] 06/04/2016 2:43 PM

## 2016-06-04 NOTE — ED Notes (Signed)
Pt states "I keep hearing my daughters out there. Tell them to come back because someone is trying to kill me"; pt advised that her daughters are no longer here and that she hears staff talking; pt reassured that she is a safe place

## 2016-06-04 NOTE — ED Notes (Signed)
Pt ambulated to the bathroom and back with the walker; no acute distress noted

## 2016-06-04 NOTE — Progress Notes (Signed)
Patient is pending admission at [facility], per [place].  CSW will continue to follow up with placement.  Verlon Setting, [place] Disposition staff 06/04/2016 1:41 PM

## 2016-06-04 NOTE — BH Assessment (Signed)
[facility] Assessment Progress Note  06/04/16: Patient continues to meet inpatient Gero-Psych criteria as appropriate bed placement is investigated.

## 2016-06-04 NOTE — ED Notes (Signed)
Pt has been very cooperative this shift.  She was able to tell me that she needs to go to the bathroom, she ambulated with stand by assist and her walker.  She took a shower where she was able to wash herself independently (on [facility] in shower) and dry herself off and dress with minimal assist by this RN.  She ambulated back with walker and was able to brush her teeth and get back in bed.  Pt consented to CBG check x2 and took all meds and allowed me to give her both her breathing treatment as well as lantus sq injection.  Daughter is visiting at this time

## 2016-06-04 NOTE — ED Notes (Signed)
No respiratory or acute distress noted resting in bed with eyes closed call light in reach no reaction to medication noted. 

## 2016-06-04 NOTE — ED Notes (Signed)
Pt remains paranoid and states "I think I saw a man out there and he is going to come get me"; pt reassured that she is safe in here

## 2016-06-04 NOTE — ED Notes (Signed)
Daughter is visiting and mentions to me that she does not want her mother to go to [facility] due to a recent employment she had with them that terminated.  Advised her that I would note this on the chart and pass along this information.

## 2016-06-04 NOTE — ED Notes (Signed)
Pt ambulated to the bathroom and back with a walker

## 2016-06-05 DIAGNOSIS — F22 Delusional disorders: Secondary | ICD-10-CM | POA: Diagnosis not present

## 2016-06-05 NOTE — ED Notes (Signed)
Patient accepted medications and asked for refill of drink. When I returned with drink she spoke very sternly and said, 'have I done anything to you? I heard what you said to that man. Why would you say that? I've been nice to you.' Reassured patient that she has been extremely polite and cooperative and nothing was said about her. She said, 'oh I guess I am hearing and seeing things still.'

## 2016-06-05 NOTE — BHH Counselor (Signed)
Spoke with Anderson Malta at [facility] who reports she has not received the updated IVC. IVC has been faxed to the provided number of [phone].  Lind Covert, MSW, Latanya Presser

## 2016-06-05 NOTE — BHH Counselor (Signed)
Per Anderson Malta from [facility], IVC paperwork has been received and the pt has been accepted to [id number] and the accepting is Dr. Ananias Pilgrim. Pt is able to be transported at any time. Mendel Ryder, RN notified of the bed placement. Call to report is [phone].  Lind Covert, MSW, Latanya Presser

## 2016-06-05 NOTE — ED Notes (Signed)
Patient requested her daughter be notified that she is leaving. Samantha [name] was called and message left.

## 2016-06-05 NOTE — ED Notes (Signed)
Report given to Anderson Malta, Therapist, sports at [facility]

## 2016-06-06 DIAGNOSIS — K649 Unspecified hemorrhoids: Secondary | ICD-10-CM | POA: Insufficient documentation

## 2016-06-06 DIAGNOSIS — J4520 Mild intermittent asthma, uncomplicated: Secondary | ICD-10-CM | POA: Insufficient documentation

## 2016-06-06 DIAGNOSIS — K5900 Constipation, unspecified: Secondary | ICD-10-CM | POA: Insufficient documentation

## 2016-06-06 DIAGNOSIS — G4730 Sleep apnea, unspecified: Secondary | ICD-10-CM | POA: Insufficient documentation

## 2016-06-06 DIAGNOSIS — Z8673 Personal history of transient ischemic attack (TIA), and cerebral infarction without residual deficits: Secondary | ICD-10-CM | POA: Insufficient documentation

## 2016-06-07 DIAGNOSIS — Z9181 History of falling: Secondary | ICD-10-CM | POA: Insufficient documentation

## 2016-06-09 DIAGNOSIS — E559 Vitamin D deficiency, unspecified: Secondary | ICD-10-CM | POA: Insufficient documentation

## 2016-06-10 DIAGNOSIS — E11649 Type 2 diabetes mellitus with hypoglycemia without coma: Secondary | ICD-10-CM | POA: Insufficient documentation

## 2016-06-18 DIAGNOSIS — N183 Chronic kidney disease, stage 3 unspecified: Secondary | ICD-10-CM | POA: Insufficient documentation

## 2016-06-23 DIAGNOSIS — D649 Anemia, unspecified: Secondary | ICD-10-CM | POA: Insufficient documentation

## 2016-06-23 DIAGNOSIS — R001 Bradycardia, unspecified: Secondary | ICD-10-CM | POA: Insufficient documentation

## 2016-06-23 DIAGNOSIS — R410 Disorientation, unspecified: Secondary | ICD-10-CM | POA: Insufficient documentation

## 2016-06-23 DIAGNOSIS — R140 Abdominal distension (gaseous): Secondary | ICD-10-CM | POA: Insufficient documentation

## 2016-06-24 DIAGNOSIS — R339 Retention of urine, unspecified: Secondary | ICD-10-CM | POA: Insufficient documentation

## 2016-06-25 DIAGNOSIS — I501 Left ventricular failure: Secondary | ICD-10-CM | POA: Insufficient documentation

## 2016-06-28 DIAGNOSIS — N179 Acute kidney failure, unspecified: Secondary | ICD-10-CM | POA: Insufficient documentation

## 2016-07-07 DIAGNOSIS — Z20828 Contact with and (suspected) exposure to other viral communicable diseases: Secondary | ICD-10-CM | POA: Insufficient documentation

## 2016-07-07 DIAGNOSIS — R059 Cough, unspecified: Secondary | ICD-10-CM | POA: Insufficient documentation

## 2016-07-09 DIAGNOSIS — H6121 Impacted cerumen, right ear: Secondary | ICD-10-CM | POA: Insufficient documentation

## 2019-01-25 ENCOUNTER — Emergency Department (HOSPITAL_COMMUNITY): Payer: Medicare Other

## 2019-01-25 ENCOUNTER — Emergency Department (HOSPITAL_COMMUNITY)
Admission: EM | Admit: 2019-01-25 | Discharge: 2019-01-25 | Disposition: A | Discharge status: Home / Self Care | Payer: Medicare Other | Attending: Emergency Medicine | Admitting: Emergency Medicine

## 2019-01-25 ENCOUNTER — Other Ambulatory Visit: Payer: Self-pay

## 2019-01-25 DIAGNOSIS — E119 Type 2 diabetes mellitus without complications: Secondary | ICD-10-CM | POA: Diagnosis not present

## 2019-01-25 DIAGNOSIS — Z794 Long term (current) use of insulin: Secondary | ICD-10-CM | POA: Diagnosis not present

## 2019-01-25 DIAGNOSIS — Z8673 Personal history of transient ischemic attack (TIA), and cerebral infarction without residual deficits: Secondary | ICD-10-CM | POA: Insufficient documentation

## 2019-01-25 DIAGNOSIS — F4320 Adjustment disorder, unspecified: Secondary | ICD-10-CM | POA: Diagnosis not present

## 2019-01-25 DIAGNOSIS — Z79899 Other long term (current) drug therapy: Secondary | ICD-10-CM | POA: Diagnosis not present

## 2019-01-25 DIAGNOSIS — I110 Hypertensive heart disease with heart failure: Secondary | ICD-10-CM | POA: Insufficient documentation

## 2019-01-25 DIAGNOSIS — I509 Heart failure, unspecified: Secondary | ICD-10-CM | POA: Insufficient documentation

## 2019-01-25 DIAGNOSIS — R531 Weakness: Secondary | ICD-10-CM | POA: Insufficient documentation

## 2019-01-25 DIAGNOSIS — F4321 Adjustment disorder with depressed mood: Secondary | ICD-10-CM

## 2019-01-25 DIAGNOSIS — R4182 Altered mental status, unspecified: Secondary | ICD-10-CM | POA: Diagnosis not present

## 2019-01-25 LAB — COMPREHENSIVE METABOLIC PANEL
ALT: 13 U/L (ref 0–44)
AST: 16 U/L (ref 15–41)
Albumin: 3.5 g/dL (ref 3.5–5.0)
Alkaline Phosphatase: 61 U/L (ref 38–126)
Anion gap: 9 (ref 5–15)
BUN: 43 mg/dL — ABNORMAL HIGH (ref 8–23)
CO2: 19 mmol/L — ABNORMAL LOW (ref 22–32)
Calcium: 10.7 mg/dL — ABNORMAL HIGH (ref 8.9–10.3)
Chloride: 104 mmol/L (ref 98–111)
Creatinine, Ser: 2.38 mg/dL — ABNORMAL HIGH (ref 0.44–1.00)
GFR calc Af Amer: 23 mL/min — ABNORMAL LOW (ref >60)
GFR calc non Af Amer: 20 mL/min — ABNORMAL LOW (ref >60)
Glucose, Bld: 146 mg/dL — ABNORMAL HIGH (ref 70–99)
Potassium: 4.7 mmol/L (ref 3.5–5.1)
Sodium: 132 mmol/L — ABNORMAL LOW (ref 135–145)
Total Bilirubin: 0.4 mg/dL (ref 0.3–1.2)
Total Protein: 6.7 g/dL (ref 6.5–8.1)

## 2019-01-25 LAB — DIFFERENTIAL
Abs Immature Granulocytes: 0.03 10*3/uL (ref 0.00–0.07)
Basophils Absolute: 0.1 10*3/uL (ref 0.0–0.1)
Basophils Relative: 1 %
Eosinophils Absolute: 0.2 10*3/uL (ref 0.0–0.5)
Eosinophils Relative: 3 %
Immature Granulocytes: 0 %
Lymphocytes Relative: 13 %
Lymphs Abs: 1.2 10*3/uL (ref 0.7–4.0)
Monocytes Absolute: 0.6 10*3/uL (ref 0.1–1.0)
Monocytes Relative: 6 %
Neutro Abs: 7.1 10*3/uL (ref 1.7–7.7)
Neutrophils Relative %: 77 %

## 2019-01-25 LAB — CBC
HCT: 37.8 % (ref 36.0–46.0)
Hemoglobin: 12.1 g/dL (ref 12.0–15.0)
MCH: 27.7 pg (ref 26.0–34.0)
MCHC: 32 g/dL (ref 30.0–36.0)
MCV: 86.5 fL (ref 80.0–100.0)
Platelets: 210 10*3/uL (ref 150–400)
RBC: 4.37 MIL/uL (ref 3.87–5.11)
RDW: 14.5 % (ref 11.5–15.5)
WBC: 9.1 10*3/uL (ref 4.0–10.5)
nRBC: 0 % (ref 0.0–0.2)

## 2019-01-25 LAB — PROTIME-INR
INR: 1 (ref 0.8–1.2)
Prothrombin Time: 13.2 seconds (ref 11.4–15.2)

## 2019-01-25 LAB — APTT: aPTT: 30 seconds (ref 24–36)

## 2019-01-25 LAB — I-STAT CHEM 8, ED
BUN: 41 mg/dL — ABNORMAL HIGH (ref 8–23)
Calcium, Ion: 1.3 mmol/L (ref 1.15–1.40)
Chloride: 107 mmol/L (ref 98–111)
Creatinine, Ser: 2.4 mg/dL — ABNORMAL HIGH (ref 0.44–1.00)
Glucose, Bld: 139 mg/dL — ABNORMAL HIGH (ref 70–99)
HCT: 38 % (ref 36.0–46.0)
Hemoglobin: 12.9 g/dL (ref 12.0–15.0)
Potassium: 4.7 mmol/L (ref 3.5–5.1)
Sodium: 134 mmol/L — ABNORMAL LOW (ref 135–145)
TCO2: 20 mmol/L — ABNORMAL LOW (ref 22–32)

## 2019-01-25 LAB — URINALYSIS, ROUTINE W REFLEX MICROSCOPIC
Bilirubin Urine: NEGATIVE
Glucose, UA: NEGATIVE mg/dL
Hgb urine dipstick: NEGATIVE
Ketones, ur: NEGATIVE mg/dL
Leukocytes,Ua: NEGATIVE
Nitrite: NEGATIVE
Protein, ur: NEGATIVE mg/dL
Specific Gravity, Urine: 1.01 (ref 1.005–1.030)
pH: 5 (ref 5.0–8.0)

## 2019-01-25 MED ORDER — SODIUM CHLORIDE 0.9 % IV BOLUS
1000.0000 mL | Freq: Once | INTRAVENOUS | Status: AC
  Administered 2019-01-25: 19:00:00 1000 mL via INTRAVENOUS

## 2019-01-25 NOTE — Discharge Instructions (Addendum)
You testing today looks ok. You have some kidney disease but this is chronic to some degree. There is no evidence of a stroke on your imaging.   I'm so sorry about the loss of your mother. The grief that comes along with the death of loved one could have contributed to your symptoms today. I think you are medically safe to go home at this time though. I would like you to follow-up with your family doctor.

## 2019-01-25 NOTE — ED Triage Notes (Signed)
Per [facility] pt coming from home, called out for stroke symptoms. Patient states at 9:30 today she felt nasueated and weakness in her left leg. Symptoms have resolved and patient feels that she is now at her baseline. 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

## 2019-01-25 NOTE — ED Notes (Signed)
Called pt for room. I did see pt and pt did not answer

## 2019-02-06 NOTE — ED Provider Notes (Signed)
[facility] EMERGENCY DEPARTMENT Provider Note   CSN: [id number] Arrival date & time: 01/25/19  1255     History   Chief Complaint Chief Complaint  Patient presents with  . Weakness    HPI Samantha [name] is a 73 y.o. female.     HPI   73yF with generalized weakness. Onset a few days ago. Today felt nauseated and even weaker than she has been. Currently improved but does fee back to baseline. Denies acute focal deficits to me. Says upset over news that family member just died. Denies acute pain.   Past Medical History:  Diagnosis Date  . Anemia   . Bipolar 1 disorder ([place])   . CHF (congestive heart failure) ([place])   . Depression   . Diabetes mellitus without complication ([place])   . Hypertension   . Neuropathy ([place])   . Stroke [facility])     Patient Active Problem List   Diagnosis Date Noted  . Schizoaffective disorder, depressive type ([place]) 06/03/2016  . Auditory hallucination   . Paranoia ([place])   . Visual hallucinations   . Hyperglycemia 08/31/2013  . Bipolar 1 disorder, depressed ([place]) 08/29/2013  . Psychosis ([place]) 08/29/2013  . Bipolar 1 disorder ([place]) 08/29/2013    Past Surgical History:  Procedure Laterality Date  . CESAREAN SECTION       OB History   No obstetric history on file.      Home Medications    Prior to Admission medications   Medication Sig Start Date End Date Taking? Authorizing Provider  acetaminophen (TYLENOL) 500 MG tablet Take 500 mg by mouth every 6 (six) hours as needed for mild pain.    [provider]  albuterol (PROVENTIL HFA;VENTOLIN HFA) 108 (90 BASE) MCG/ACT inhaler Inhale 2 puffs into the lungs every 6 (six) hours as needed for wheezing. 09/02/13   Theodis Blaze, MD  ALPRAZolam Duanne Moron) 1 MG tablet Take 1 mg by mouth 3 (three) times daily.    [provider]  atorvastatin (LIPITOR) 20 MG tablet Take 20 mg by mouth at bedtime.     [provider]  cloNIDine (CATAPRES - DOSED IN MG/24  HR) 0.3 mg/24hr patch Place 1 patch onto the skin once a week.    [provider]  hydrALAZINE (APRESOLINE) 25 MG tablet Take 25 mg by mouth 3 (three) times daily. 09/08/15   [provider]  hydrocortisone cream 1 % Apply 1 application topically 2 (two) times daily as needed for itching. For hemorrhoids    [provider]  insulin glargine (LANTUS) 100 unit/mL SOPN Inject 10 U in the AM and 80 U in the PM Patient taking differently: Inject 47-65 Units into the skin 2 (two) times daily. Inject 47 unitsin the AM and 65 units in the PM 09/02/13   Theodis Blaze, MD  latanoprost (XALATAN) 0.005 % ophthalmic solution Place 1 drop into both eyes at bedtime. 08/30/15   [provider]  Liraglutide (VICTOZA) 18 MG/3ML SOPN Inject 1.8 mg into the skin daily at 12 noon. 09/02/13   Theodis Blaze, MD  Melatonin 10 MG TABS Take 10 mg by mouth at bedtime.     [provider]  metoprolol (TOPROL-XL) 200 MG 24 hr tablet Take 200 mg by mouth daily.    [provider]  pregabalin (LYRICA) 50 MG capsule Take 50 mg by mouth 2 (two) times daily.    [provider]  Propylene Glycol (SYSTANE BALANCE) 0.6 % SOLN Place  1 drop into both eyes 3 (three) times daily with meals.    [provider]  risperiDONE (RISPERDAL) 1 MG tablet Take 1.5 mg by mouth 2 (two) times daily.    [provider]  tiZANidine (ZANAFLEX) 2 MG tablet Take 2 mg by mouth 2 (two) times daily.  09/08/15   [provider]  venlafaxine (EFFEXOR) 75 MG tablet Take 75-150 mg by mouth 2 (two) times daily with a meal. Take 1 in the morning Take 2 at bedtime    [provider]    Family History No family history on file.  Social History Social History   Tobacco Use  . Smoking status: Never Smoker  Substance Use Topics  . Alcohol use: No  . Drug use: Not on file     Allergies   Peanut-containing drug products and Alprazolam   Review of Systems  Review of Systems  All systems reviewed and negative, other than as noted in HPI.  Physical Exam Updated Vital Signs BP (!) 182/69 (BP Location: Right Arm)   Pulse 71   Temp 98.7 F (37.1 C) (Oral)   Resp 20   SpO2 99%   Physical Exam Vitals signs and nursing note reviewed.  Constitutional:      General: She is not in acute distress.    Appearance: She is well-developed.  HENT:     Head: Normocephalic and atraumatic.  Eyes:     General:        Right eye: No discharge.        Left eye: No discharge.     Conjunctiva/sclera: Conjunctivae normal.  Neck:     Musculoskeletal: Neck supple.  Cardiovascular:     Rate and Rhythm: Normal rate and regular rhythm.     Heart sounds: Normal heart sounds. No murmur. No friction rub. No gallop.   Pulmonary:     Effort: Pulmonary effort is normal. No respiratory distress.     Breath sounds: Normal breath sounds.  Abdominal:     General: There is no distension.     Palpations: Abdomen is soft.     Tenderness: There is no abdominal tenderness.  Musculoskeletal:        General: No tenderness.  Skin:    General: Skin is warm and dry.  Neurological:     Mental Status: She is alert.     Comments: Generalized weakness. No focal deficit noted.   Psychiatric:        Behavior: Behavior normal.        Thought Content: Thought content normal.      ED Treatments / Results  Labs (all labs ordered are listed, but only abnormal results are displayed) Labs Reviewed  COMPREHENSIVE METABOLIC PANEL - Abnormal; Notable for the following components:      Result Value   Sodium 132 (*)    CO2 19 (*)    Glucose, Bld 146 (*)    BUN 43 (*)    Creatinine, Ser 2.38 (*)    Calcium 10.7 (*)    GFR calc non Af Amer 20 (*)    GFR calc Af Amer 23 (*)    All other components within normal limits  I-STAT CHEM 8, ED - Abnormal; Notable for the following components:   Sodium 134 (*)    BUN 41 (*)    Creatinine, Ser 2.40 (*)    Glucose, Bld 139 (*)     TCO2 20 (*)    All other components within normal limits  PROTIME-INR  APTT  CBC  DIFFERENTIAL  URINALYSIS, ROUTINE W REFLEX MICROSCOPIC    EKG EKG Interpretation  Date/Time:  Saturday January 25 2019 13:05:58 EDT Ventricular Rate:  79 PR Interval:  250 QRS Duration: 78 QT Interval:  358 QTC Calculation: 410 R Axis:   -70 Text Interpretation:  Sinus rhythm with 1st degree A-V block Left axis deviation Anterior infarct , age undetermined Abnormal ECG Confirmed by Virgel Manifold [phone]) on 01/25/2019 3:59:52 PM   Radiology No results found.  Procedures Procedures (including critical care time)  Medications Ordered in ED Medications  sodium chloride 0.9 % bolus 1,000 mL (0 mLs Intravenous Stopped 01/25/19 2037)     Initial Impression / Assessment and Plan / ED Course  I have reviewed the triage vital signs and the nursing notes.  Pertinent labs & imaging results that were available during my care of the patient were reviewed by me and considered in my medical decision making (see chart for details).     73yF with generalized weakness. Doesn't appear to have acute neurologic deficit. Suspect may be component of anxiety/grief. I doubt emergent process.   Final Clinical Impressions(s) / ED Diagnoses   Final diagnoses:  Weakness  Grieving    ED Discharge Orders    None       Virgel Manifold, MD 02/06/19 [phone]

## 2020-02-04 IMAGING — MR MR HEAD W/O CM
12 of 13 series · 44 of 48 positions shown · non-contrast
Comparison: Head CT earlier today.

CLINICAL DATA: 73-year-old female with left leg weakness, nausea,
unexplained altered mental status. Indeterminate left pontine
infarct by plain CT today.

EXAM:
MRI HEAD WITHOUT CONTRAST
TECHNIQUE: Multiplanar, multiecho pulse sequences of the brain and surrounding
structures were obtained without intravenous contrast.

[Series 5: DWI · axial · 3.0mm · 0.92mm/px · z∈[-101,+48]mm · 7 of 104 slices shown (1 of 4)]
[im 1/104]
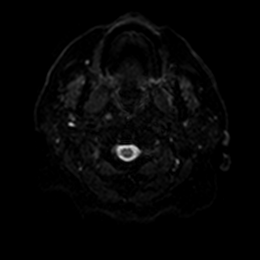
[im 18/104]
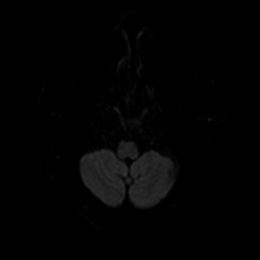
[im 35/104]
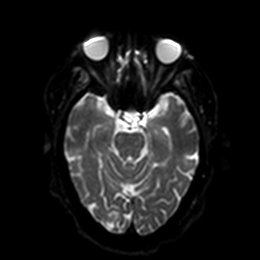
[im 52/104]
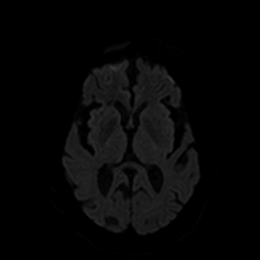
[im 69/104]
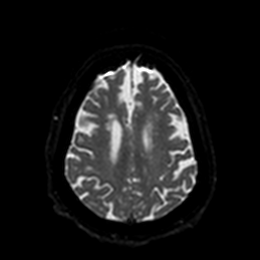
[im 86/104]
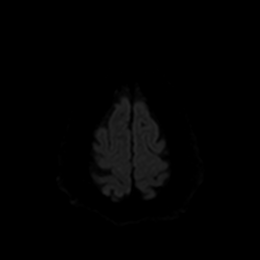
[im 104/104]
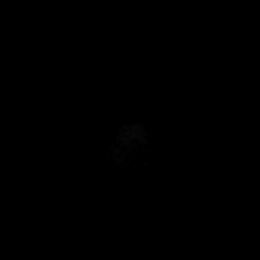

[Series 6: DWI · axial · 3.0mm · 0.92mm/px · z∈[-101,+48]mm · 4 of 52 slices shown (2 of 4)]
[im 1/52]
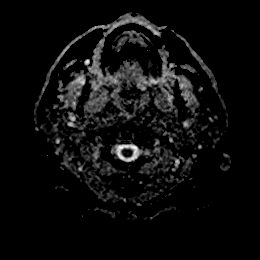
[im 18/52]
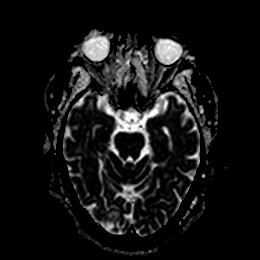
[im 35/52]
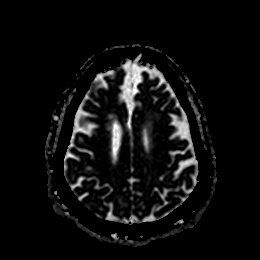
[im 52/52]
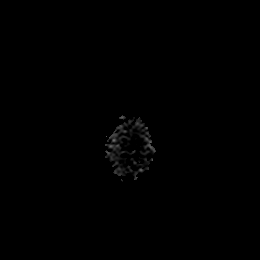

[Series 7: DWI · coronal · 4.0mm · 0.88mm/px · 6 of 78 slices shown (3 of 4)]
[im 1/78]
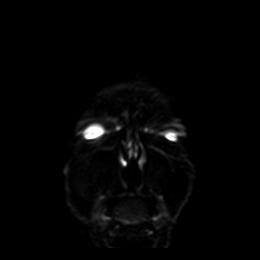
[im 16/78]
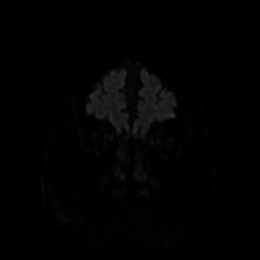
[im 31/78]
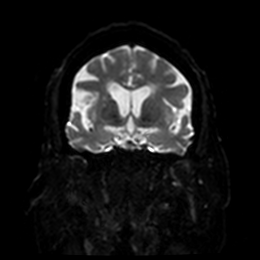
[im 47/78]
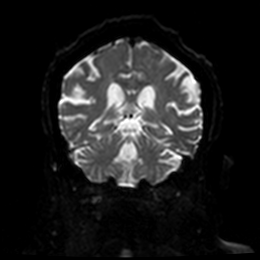
[im 62/78]
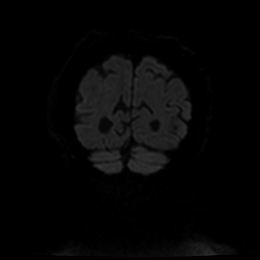
[im 78/78]
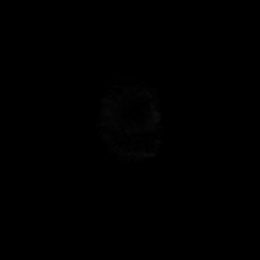

[Series 8: DWI · coronal · 4.0mm · 0.88mm/px · 3 of 39 slices shown (4 of 4)]
[im 1/39]
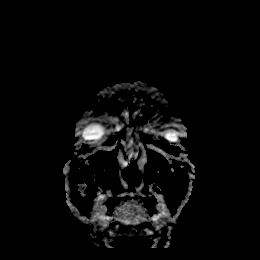
[im 20/39]
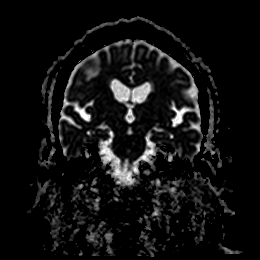
[im 39/39]
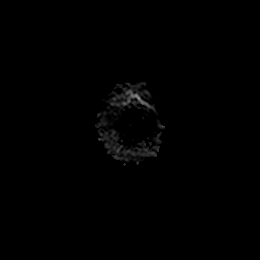

[Series 9: FLAIR · axial · 5.0mm · 0.47mm/px · z∈[-96,+45]mm · 2 of 25 slices shown]
[im 1/25]
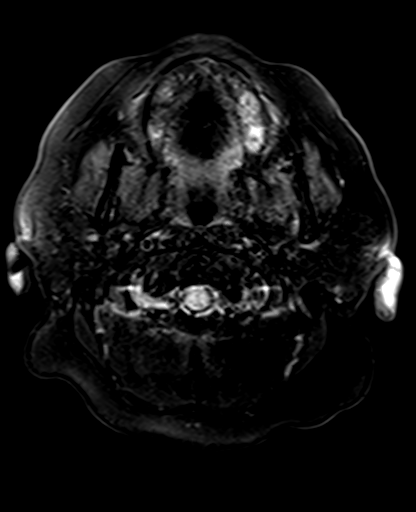
[im 25/25]
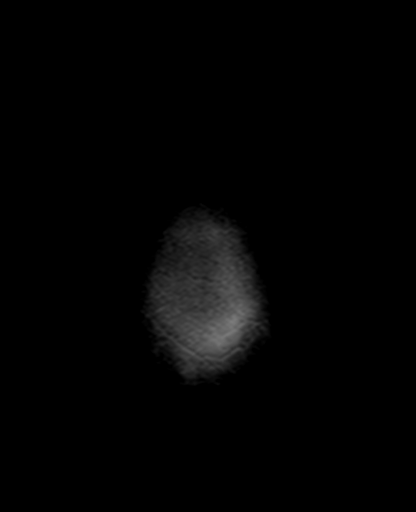

[Series 10: mag_images · axial · 3.0mm · 0.94mm/px · z∈[-100,+49]mm · 4 of 52 slices shown]
[im 1/52]
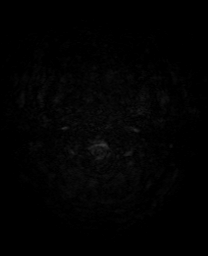
[im 18/52]
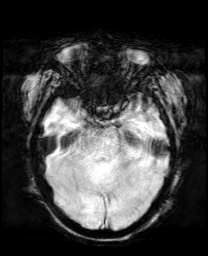
[im 35/52]
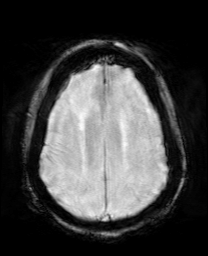
[im 52/52]
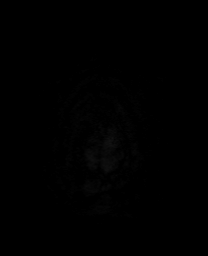

[Series 11: pha_images · axial · 3.0mm · 0.94mm/px · z∈[-97,+49]mm · 4 of 51 slices shown]
[im 1/51]
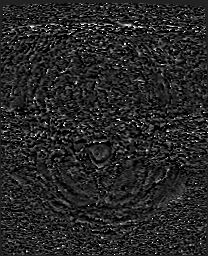
[im 17/51]
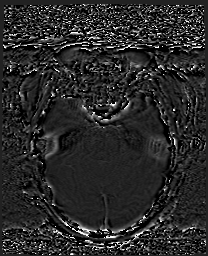
[im 34/51]
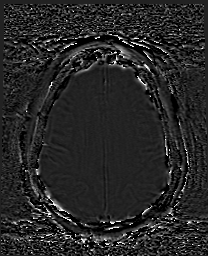
[im 51/51]
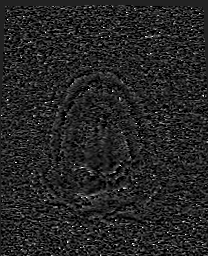

[Series 12: swi_images · axial · 3.0mm · 0.94mm/px · z∈[-100,+49]mm · 4 of 52 slices shown]
[im 1/52]
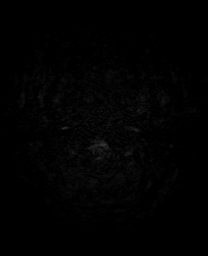
[im 18/52]
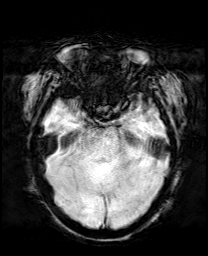
[im 35/52]
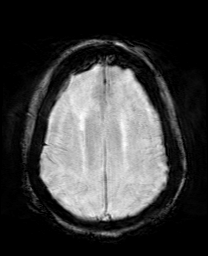
[im 52/52]
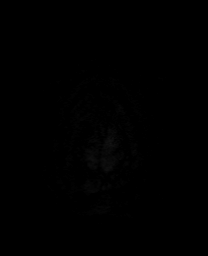

[Series 13: mip_images(sw) · axial · 24.0mm · 0.94mm/px · z∈[-90,+39]mm · 3 of 45 slices shown]
[im 1/45]
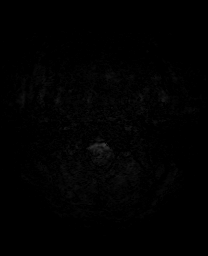
[im 23/45]
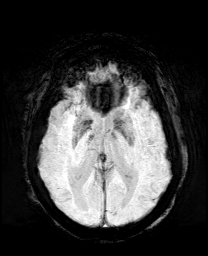
[im 45/45]
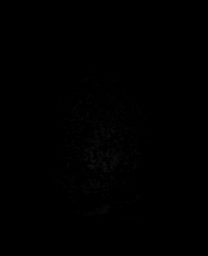

[Series 14: T1 · sagittal · 5.0mm · 0.75mm/px · 2 of 25 slices shown]
[im 1/25]
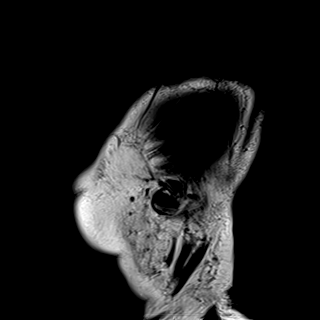
[im 25/25]
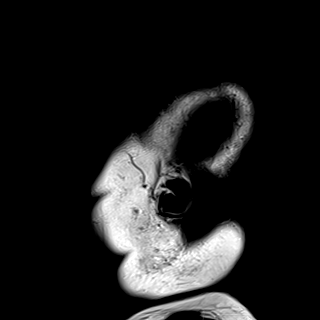

[Series 15: T2 · axial · 5.0mm · 0.75mm/px · z∈[-97,+43]mm · 2 of 25 slices shown (1 of 2)]
[im 1/25]
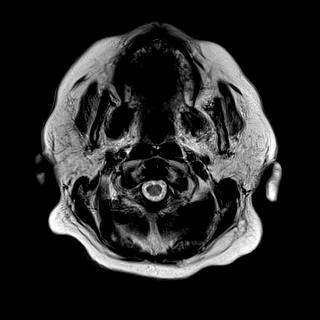
[im 25/25]
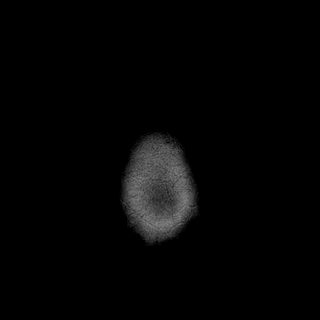

[Series 17: T2 · coronal · 5.0mm · 0.72mm/px · 3 of 33 slices shown (2 of 2)]
[im 1/33]
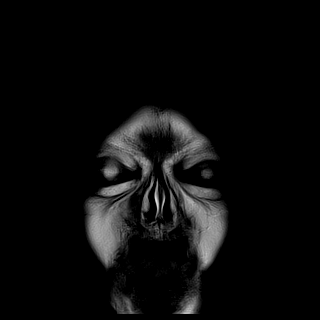
[im 17/33]
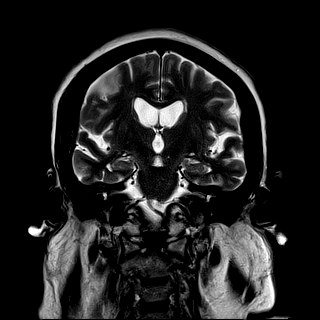
[im 33/33]
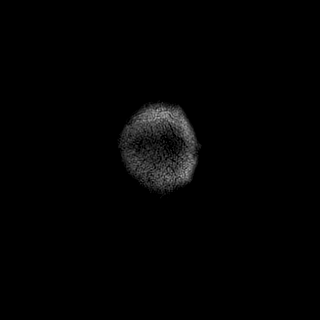

[44 of 48 positions shown; findings below may reference images not displayed]

FINDINGS: Brain: No restricted diffusion or evidence of acute infarction.

No signal abnormality in the pons or brainstem, such that the
earlier noncontrast CT finding was artifactual.

There is chronic encephalomalacia in the right occipital pole. No
chronic cerebral blood products. Mild for age nonspecific cerebral
white matter T2 and FLAIR hyperintensity. The deep gray nuclei,
brainstem, and cerebellum are within normal limits for age.

No midline shift, mass effect, evidence of mass lesion,
ventriculomegaly, extra-axial collection or acute intracranial
hemorrhage. Cervicomedullary junction and pituitary are within
normal limits.

Vascular: Major intracranial vascular flow voids are preserved.

Skull and upper cervical spine: Negative visible cervical spine.
Visualized bone marrow signal is within normal limits.

Sinuses/Orbits: Postoperative changes to the globes, otherwise
negative orbits. Paranasal sinus mucosal thickening redemonstrated.

Other: Mastoids are clear. Grossly negative visible internal
auditory structures. Scalp and face soft tissues appear negative.
IMPRESSION: 1. No acute intracranial abnormality. Normal brainstem for age, the
CT finding in the left pons earlier was artifact.
2. Small chronic right PCA territory infarct.

## 2020-02-04 IMAGING — CT CT HEAD W/O CM
4 series · 16 of 47 positions shown, 18 images · non-contrast
Comparison: None.

CLINICAL DATA: Left leg weakness and nausea since this morning

EXAM:
CT HEAD WITHOUT CONTRAST
TECHNIQUE: Contiguous axial images were obtained from the base of the skull
through the vertex without intravenous contrast.

[Series 3: head without · axial · non-contrast · 0.42mm/px · z∈[-123,-3]mm · 7 of 32 slices shown, 9 images]
[im 4/32  brain]
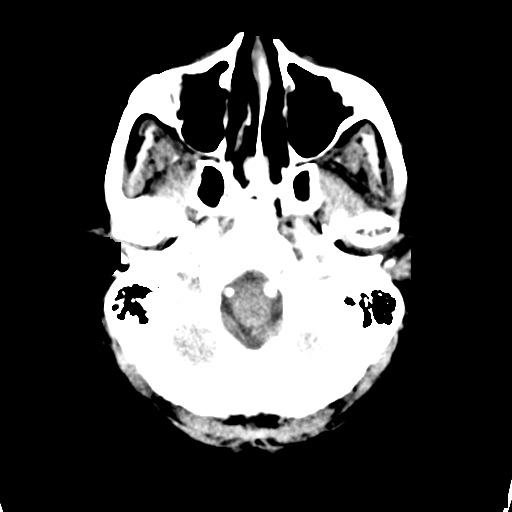
[im 4/32  bone]
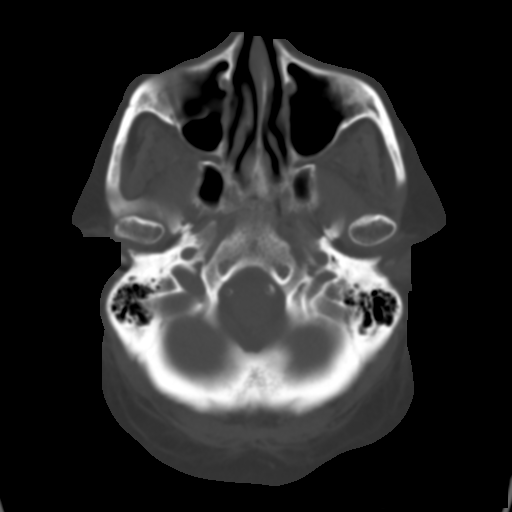
[im 8/32  brain]
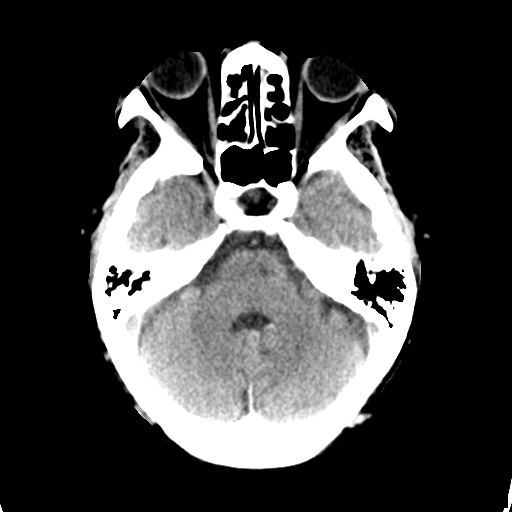
[im 12/32  brain]
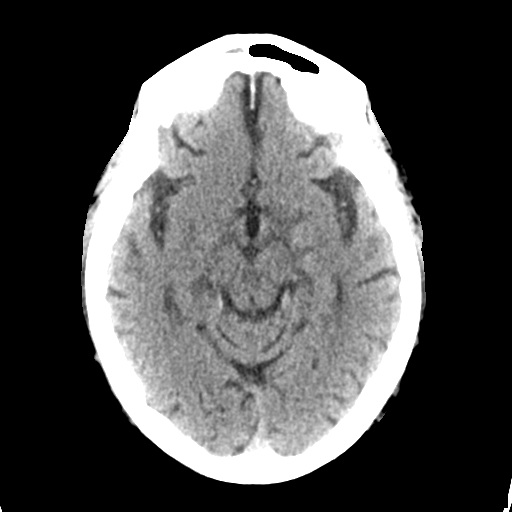
[im 16/32  brain]
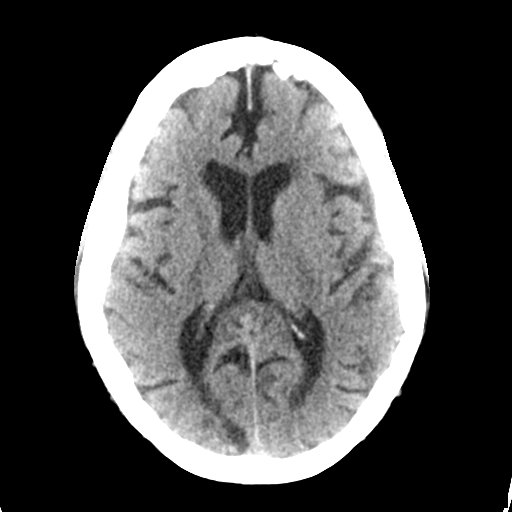
[im 20/32  brain]
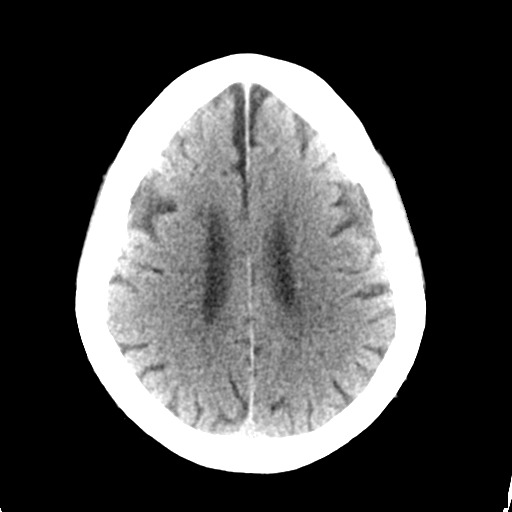
[im 20/32  bone]
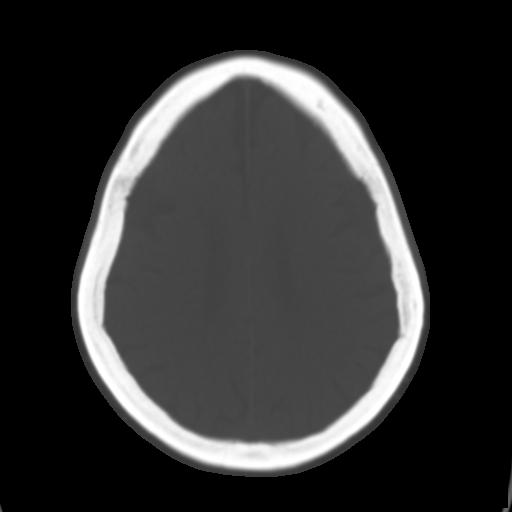
[im 24/32  brain]
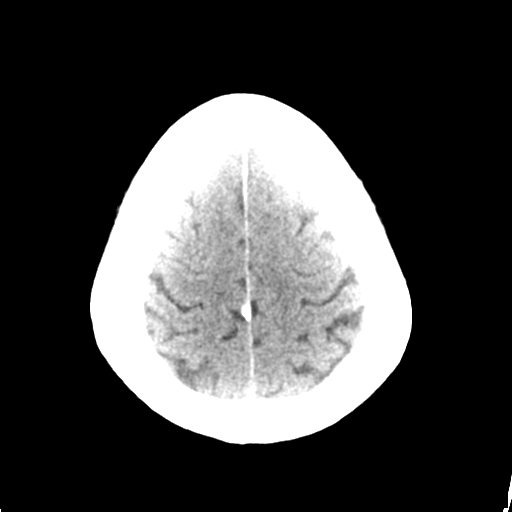
[im 28/32  brain]
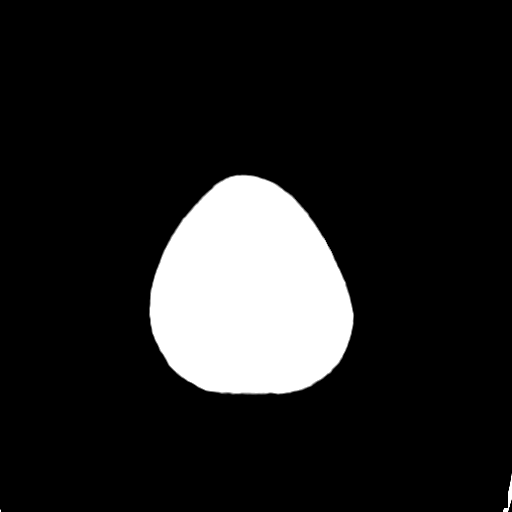

[Series 4: head bone · axial · 0.42mm/px · z∈[-124,-92]mm · 3 of 78 slices shown]
[im 8/78  bone]
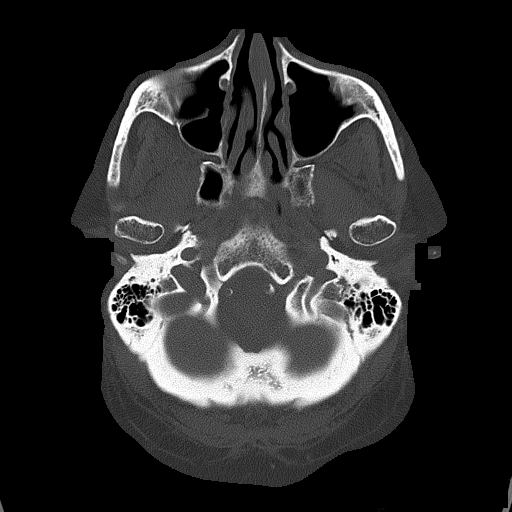
[im 16/78  bone]
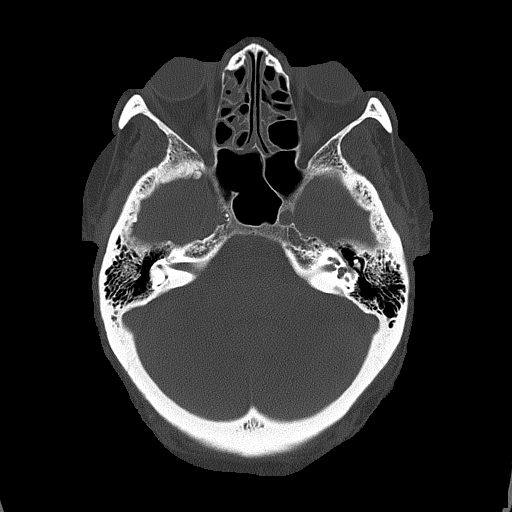
[im 24/78  bone]
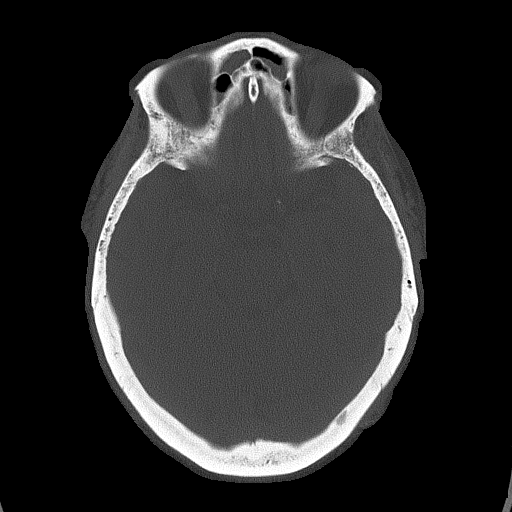

[Series 5: head without cor · coronal · non-contrast · 0.30mm/px · 3 of 67 slices shown]
[im 23/67  brain]
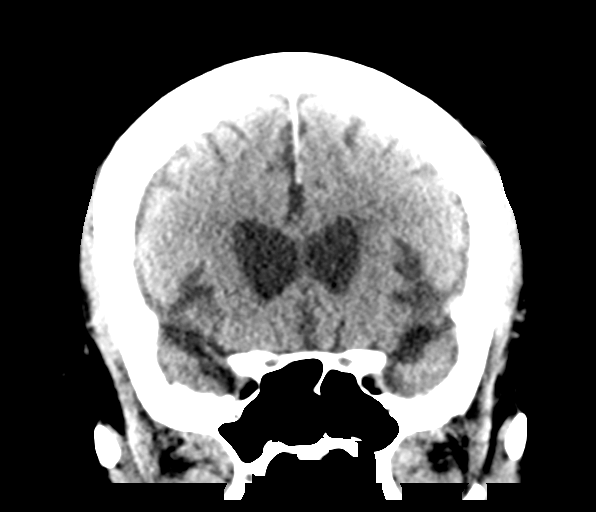
[im 30/67  brain]
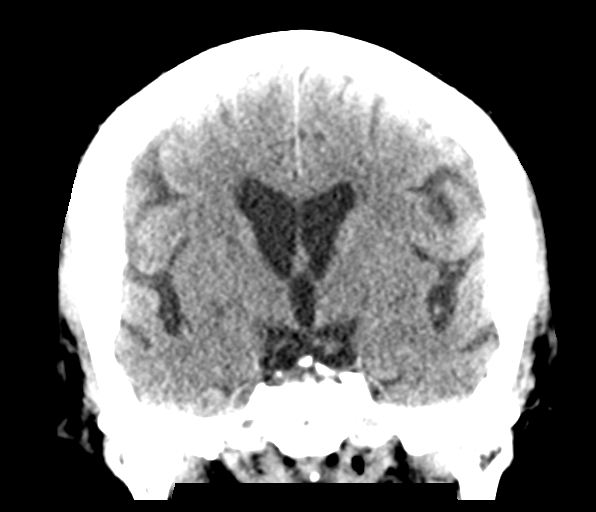
[im 37/67  brain]
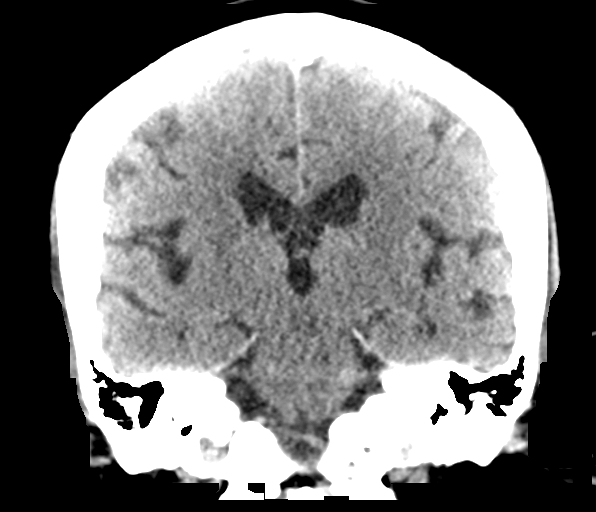

[Series 6: head without sag · sagittal · non-contrast · 0.31mm/px · 3 of 57 slices shown]
[im 19/57  brain]
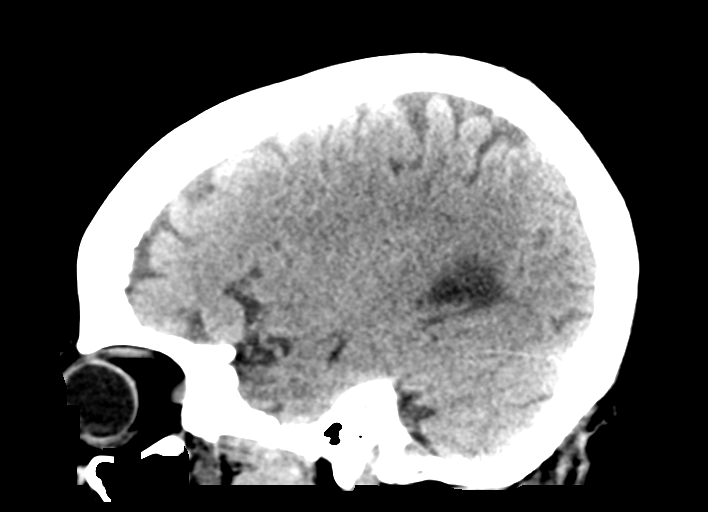
[im 29/57  brain]
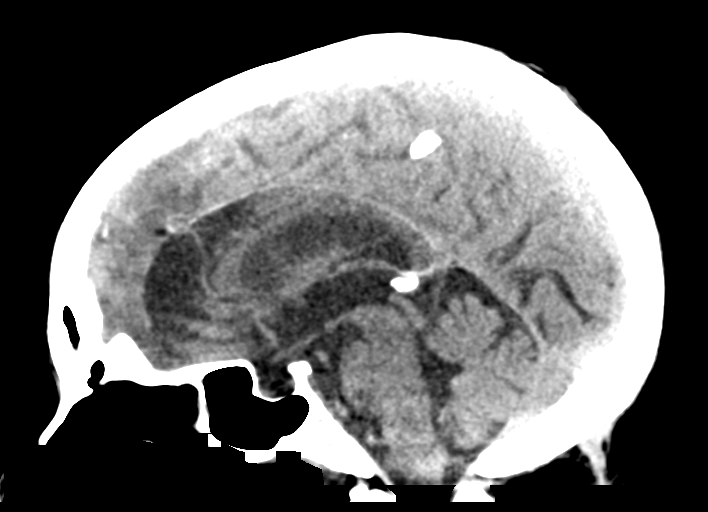
[im 38/57  brain]
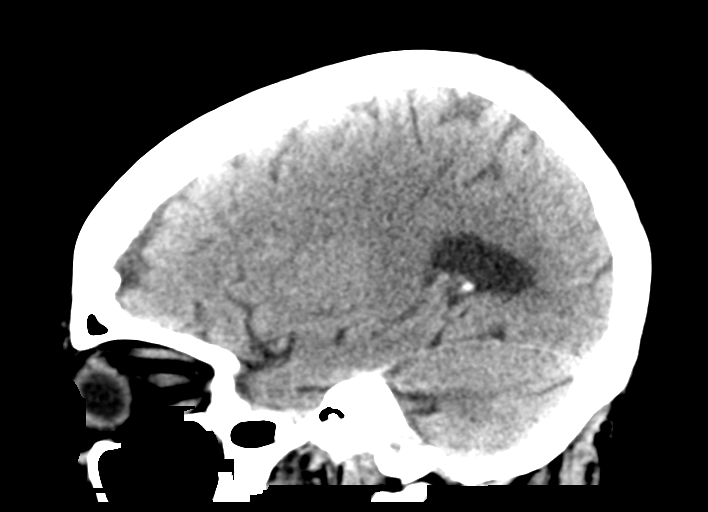

[16 of 47 positions shown; findings below may reference images not displayed]

FINDINGS: Brain: Small low-density in the left pons even when accounting for
streak artifact.

Moderate area of remote right occipital infarction. Mild
periventricular chronic small vessel ischemic change. Cerebral
volume loss that is generalized. No hemorrhage, hydrocephalus, or
masslike finding.

Vascular: No hyperdense vessel. There is generalized atherosclerotic
calcification.

Skull: Negative

Sinuses/Orbits: Patchy mucosal thickening in frontal and ethmoid
sinuses with complete opacification of the right frontal sinus.
Bilateral cataract resection
IMPRESSION: 1. Lacunar infarct in the left pons that is age indeterminate by CT.
2. Moderate, remote right occipital infarct.
3. Moderate bilateral sinusitis.

## 2020-06-04 ENCOUNTER — Emergency Department (HOSPITAL_BASED_OUTPATIENT_CLINIC_OR_DEPARTMENT_OTHER)
Admission: EM | Admit: 2020-06-04 | Discharge: 2020-06-05 | Disposition: A | Discharge status: Home / Self Care | Payer: Medicare Other | Attending: Emergency Medicine | Admitting: Emergency Medicine

## 2020-06-04 ENCOUNTER — Emergency Department (HOSPITAL_BASED_OUTPATIENT_CLINIC_OR_DEPARTMENT_OTHER): Payer: Medicare Other

## 2020-06-04 ENCOUNTER — Other Ambulatory Visit: Payer: Self-pay

## 2020-06-04 ENCOUNTER — Encounter (HOSPITAL_BASED_OUTPATIENT_CLINIC_OR_DEPARTMENT_OTHER): Payer: Self-pay | Admitting: Emergency Medicine

## 2020-06-04 DIAGNOSIS — E1165 Type 2 diabetes mellitus with hyperglycemia: Secondary | ICD-10-CM | POA: Insufficient documentation

## 2020-06-04 DIAGNOSIS — Z79899 Other long term (current) drug therapy: Secondary | ICD-10-CM | POA: Diagnosis not present

## 2020-06-04 DIAGNOSIS — I509 Heart failure, unspecified: Secondary | ICD-10-CM | POA: Diagnosis not present

## 2020-06-04 DIAGNOSIS — E1140 Type 2 diabetes mellitus with diabetic neuropathy, unspecified: Secondary | ICD-10-CM | POA: Diagnosis not present

## 2020-06-04 DIAGNOSIS — Z794 Long term (current) use of insulin: Secondary | ICD-10-CM | POA: Diagnosis not present

## 2020-06-04 DIAGNOSIS — I110 Hypertensive heart disease with heart failure: Secondary | ICD-10-CM | POA: Diagnosis not present

## 2020-06-04 DIAGNOSIS — Z91010 Allergy to peanuts: Secondary | ICD-10-CM | POA: Diagnosis not present

## 2020-06-04 DIAGNOSIS — U071 COVID-19: Secondary | ICD-10-CM | POA: Insufficient documentation

## 2020-06-04 DIAGNOSIS — R509 Fever, unspecified: Secondary | ICD-10-CM | POA: Diagnosis present

## 2020-06-04 MED ORDER — SODIUM CHLORIDE 0.9 % IV BOLUS
500.0000 mL | Freq: Once | INTRAVENOUS | Status: AC
  Administered 2020-06-04: 500 mL via INTRAVENOUS

## 2020-06-04 NOTE — ED Triage Notes (Signed)
Per EMS pt had temp 102 and they gave her 100mg  tylenol enroute to ED. Pt denies any shob.

## 2020-06-04 NOTE — ED Triage Notes (Signed)
Pt to ED via EMS from [facility] facility with fever and known Covid positive.

## 2020-06-04 NOTE — ED Notes (Signed)
Spoke with patient's daughter; advised we will call with plan of care.

## 2020-06-05 ENCOUNTER — Telehealth (HOSPITAL_BASED_OUTPATIENT_CLINIC_OR_DEPARTMENT_OTHER): Payer: Self-pay | Admitting: Emergency Medicine

## 2020-06-05 DIAGNOSIS — U071 COVID-19: Secondary | ICD-10-CM | POA: Diagnosis not present

## 2020-06-05 LAB — BASIC METABOLIC PANEL
Anion gap: 7 (ref 5–15)
BUN: 30 mg/dL — ABNORMAL HIGH (ref 8–23)
CO2: 22 mmol/L (ref 22–32)
Calcium: 10.3 mg/dL (ref 8.9–10.3)
Chloride: 104 mmol/L (ref 98–111)
Creatinine, Ser: 2.01 mg/dL — ABNORMAL HIGH (ref 0.44–1.00)
GFR, Estimated: 26 mL/min — ABNORMAL LOW (ref >60)
Glucose, Bld: 145 mg/dL — ABNORMAL HIGH (ref 70–99)
Potassium: 4.3 mmol/L (ref 3.5–5.1)
Sodium: 133 mmol/L — ABNORMAL LOW (ref 135–145)

## 2020-06-05 LAB — CBC WITH DIFFERENTIAL/PLATELET
Abs Immature Granulocytes: 0.01 10*3/uL (ref 0.00–0.07)
Basophils Absolute: 0 10*3/uL (ref 0.0–0.1)
Basophils Relative: 0 %
Eosinophils Absolute: 0.2 10*3/uL (ref 0.0–0.5)
Eosinophils Relative: 3 %
HCT: 34 % — ABNORMAL LOW (ref 36.0–46.0)
Hemoglobin: 10.9 g/dL — ABNORMAL LOW (ref 12.0–15.0)
Immature Granulocytes: 0 %
Lymphocytes Relative: 49 %
Lymphs Abs: 2.6 10*3/uL (ref 0.7–4.0)
MCH: 27.8 pg (ref 26.0–34.0)
MCHC: 32.1 g/dL (ref 30.0–36.0)
MCV: 86.7 fL (ref 80.0–100.0)
Monocytes Absolute: 0.5 10*3/uL (ref 0.1–1.0)
Monocytes Relative: 9 %
Neutro Abs: 2.2 10*3/uL (ref 1.7–7.7)
Neutrophils Relative %: 39 %
Platelets: 176 10*3/uL (ref 150–400)
RBC: 3.92 MIL/uL (ref 3.87–5.11)
RDW: 14.7 % (ref 11.5–15.5)
WBC: 5.5 10*3/uL (ref 4.0–10.5)
nRBC: 0 % (ref 0.0–0.2)

## 2020-06-05 LAB — URINALYSIS, ROUTINE W REFLEX MICROSCOPIC
Bilirubin Urine: NEGATIVE
Glucose, UA: NEGATIVE mg/dL
Hgb urine dipstick: NEGATIVE
Ketones, ur: NEGATIVE mg/dL
Leukocytes,Ua: NEGATIVE
Nitrite: POSITIVE — AB
Protein, ur: NEGATIVE mg/dL
Specific Gravity, Urine: 1.015 (ref 1.005–1.030)
pH: 5 (ref 5.0–8.0)

## 2020-06-05 LAB — URINALYSIS, MICROSCOPIC (REFLEX)

## 2020-06-05 MED ORDER — SODIUM CHLORIDE 0.9 % IV BOLUS: 500.0000 mL | Freq: Once | INTRAVENOUS | Status: DC

## 2020-06-05 MED ORDER — BENZONATATE 100 MG PO CAPS
100.0000 mg | ORAL_CAPSULE | Freq: Once | ORAL | Status: AC
  Administered 2020-06-05: 100 mg via ORAL
  Filled 2020-06-05: qty 1

## 2020-06-05 NOTE — ED Provider Notes (Signed)
Nursing notes and vitals signs, including pulse oximetry, reviewed.  Summary of this visit's results, reviewed by myself:  EKG:  EKG Interpretation  Date/Time:    Ventricular Rate:    PR Interval:    QRS Duration:   QT Interval:    QTC Calculation:   R Axis:     Text Interpretation:         Labs:  Results for orders placed or performed during the hospital encounter of 06/04/20 (from the past 24 hour(s))  CBC with Differential     Status: Abnormal   Collection Time: 06/04/20 10:04 PM  Result Value Ref Range   WBC 5.5 4.0 - 10.5 K/uL   RBC 3.92 3.87 - 5.11 MIL/uL   Hemoglobin 10.9 (L) 12.0 - 15.0 g/dL   HCT 34.0 (L) 36.0 - 46.0 %   MCV 86.7 80.0 - 100.0 fL   MCH 27.8 26.0 - 34.0 pg   MCHC 32.1 30.0 - 36.0 g/dL   RDW 14.7 11.5 - 15.5 %   Platelets 176 150 - 400 K/uL   nRBC 0.0 0.0 - 0.2 %   Neutrophils Relative % 39 %   Neutro Abs 2.2 1.7 - 7.7 K/uL   Lymphocytes Relative 49 %   Lymphs Abs 2.6 0.7 - 4.0 K/uL   Monocytes Relative 9 %   Monocytes Absolute 0.5 0.1 - 1.0 K/uL   Eosinophils Relative 3 %   Eosinophils Absolute 0.2 0.0 - 0.5 K/uL   Basophils Relative 0 %   Basophils Absolute 0.0 0.0 - 0.1 K/uL   Immature Granulocytes 0 %   Abs Immature Granulocytes 0.01 0.00 - 0.07 K/uL  Basic metabolic panel     Status: Abnormal   Collection Time: 06/04/20 10:04 PM  Result Value Ref Range   Sodium 133 (L) 135 - 145 mmol/L   Potassium 4.3 3.5 - 5.1 mmol/L   Chloride 104 98 - 111 mmol/L   CO2 22 22 - 32 mmol/L   Glucose, Bld 145 (H) 70 - 99 mg/dL   BUN 30 (H) 8 - 23 mg/dL   Creatinine, Ser 2.01 (H) 0.44 - 1.00 mg/dL   Calcium 10.3 8.9 - 10.3 mg/dL   GFR, Estimated 26 (L) >60 mL/min   Anion gap 7 5 - 15    Imaging Studies: DG Chest Portable 1 View  Result Date: 06/04/2020 CLINICAL DATA:  Cough COVID-19 positivity, initial encounter EXAM: PORTABLE CHEST 1 VIEW COMPARISON:  10/27/2019 FINDINGS: The heart size and mediastinal contours are within normal limits. Both  lungs are clear. The visualized skeletal structures are unremarkable. IMPRESSION: No active disease. Electronically Signed   By: Inez Catalina M.D.   On: 06/04/2020 21:41   4:00 AM The patient's renal insufficiency appears chronic.  Urinalysis is not consistent with urinary tract infection and no pneumonia is seen in her radiograph.  Her fever is likely due to persistent COVID infection.  She does not meet inpatient criteria at this time.     Artyom Stencel, Jenny Reichmann, MD 06/05/20 [id number]

## 2020-06-05 NOTE — ED Notes (Signed)
Report called to caregiver at [facility]. Discharge instructions reviewed. No further questions. Informed pt is awaiting [facility] for transport back to facility and it is unknown when they will arrive.

## 2020-06-05 NOTE — ED Provider Notes (Signed)
[facility] EMERGENCY DEPARTMENT Provider Note   CSN: [id number] Arrival date & time: 06/04/20  2051     History Chief Complaint  Patient presents with  . Covid Positive    Samantha [name] is a 75 y.o. female.  Presents with concern for fever.  Patient states that she recently tested positive for COVID at her nursing facility.  She says that she has been feeling generally unwell but has had relatively mild symptoms.  No difficulty in breathing, chest pain, vomiting.  Primarily concern for decreased energy.  EMS report fever to 102.  Provided Tylenol.  Patient states she does not have any acute complaints at this time.  Lives in an assisted living facility.  Cough has been mild and nonproductive.  HPI     Past Medical History:  Diagnosis Date  . Anemia   . Bipolar 1 disorder ([place])   . CHF (congestive heart failure) ([place])   . Depression   . Diabetes mellitus without complication ([place])   . Hypertension   . Neuropathy   . Stroke [facility])     Patient Active Problem List   Diagnosis Date Noted  . Schizoaffective disorder, depressive type ([place]) 06/03/2016  . Auditory hallucination   . Paranoia ([place])   . Visual hallucinations   . Hyperglycemia 08/31/2013  . Bipolar 1 disorder, depressed ([place]) 08/29/2013  . Psychosis ([place]) 08/29/2013  . Bipolar 1 disorder ([place]) 08/29/2013    Past Surgical History:  Procedure Laterality Date  . CESAREAN SECTION       OB History   No obstetric history on file.     No family history on file.  Social History   Tobacco Use  . Smoking status: Never Smoker  Substance Use Topics  . Alcohol use: No    Home Medications Prior to Admission medications   Medication Sig Start Date End Date Taking? Authorizing Provider  acetaminophen (TYLENOL) 500 MG tablet Take 500 mg by mouth every 6 (six) hours as needed for mild pain.    [provider]  albuterol (PROVENTIL HFA;VENTOLIN HFA) 108 (90 BASE) MCG/ACT inhaler Inhale 2 puffs  into the lungs every 6 (six) hours as needed for wheezing. 09/02/13   Theodis Blaze, MD  ALPRAZolam Duanne Moron) 1 MG tablet Take 1 mg by mouth 3 (three) times daily.    [provider]  atorvastatin (LIPITOR) 20 MG tablet Take 20 mg by mouth at bedtime.     [provider]  cloNIDine (CATAPRES - DOSED IN MG/24 HR) 0.3 mg/24hr patch Place 1 patch onto the skin once a week.    [provider]  hydrALAZINE (APRESOLINE) 25 MG tablet Take 25 mg by mouth 3 (three) times daily. 09/08/15   [provider]  hydrocortisone cream 1 % Apply 1 application topically 2 (two) times daily as needed for itching. For hemorrhoids    [provider]  insulin glargine (LANTUS) 100 unit/mL SOPN Inject 10 U in the AM and 80 U in the PM Patient taking differently: Inject 47-65 Units into the skin 2 (two) times daily. Inject 47 unitsin the AM and 65 units in the PM 09/02/13   Theodis Blaze, MD  latanoprost (XALATAN) 0.005 % ophthalmic solution Place 1 drop into both eyes at bedtime. 08/30/15   [provider]  Liraglutide (VICTOZA) 18 MG/3ML SOPN Inject 1.8 mg into the skin daily at 12 noon. 09/02/13   Theodis Blaze, MD  Melatonin 10 MG TABS Take 10 mg by mouth at  bedtime.     [provider]  metoprolol (TOPROL-XL) 200 MG 24 hr tablet Take 200 mg by mouth daily.    [provider]  pregabalin (LYRICA) 50 MG capsule Take 50 mg by mouth 2 (two) times daily.    [provider]  Propylene Glycol (SYSTANE BALANCE) 0.6 % SOLN Place 1 drop into both eyes 3 (three) times daily with meals.    [provider]  risperiDONE (RISPERDAL) 1 MG tablet Take 1.5 mg by mouth 2 (two) times daily.    [provider]  tiZANidine (ZANAFLEX) 2 MG tablet Take 2 mg by mouth 2 (two) times daily.  09/08/15   [provider]  venlafaxine (EFFEXOR) 75 MG tablet Take 75-150 mg by mouth 2 (two) times daily with a meal. Take 1 in the morning Take 2 at  bedtime    [provider]    Allergies    Peanut-containing drug products and Alprazolam  Review of Systems   Review of Systems  Constitutional: Positive for fatigue and fever. Negative for chills.  HENT: Negative for ear pain and sore throat.   Eyes: Negative for pain and visual disturbance.  Respiratory: Positive for cough. Negative for shortness of breath.   Cardiovascular: Negative for chest pain and palpitations.  Gastrointestinal: Negative for abdominal pain and vomiting.  Genitourinary: Negative for dysuria and hematuria.  Musculoskeletal: Positive for arthralgias and myalgias. Negative for back pain.  Skin: Negative for color change and rash.  Neurological: Negative for seizures and syncope.  All other systems reviewed and are negative.   Physical Exam Updated Vital Signs BP (!) 161/72   Pulse 74   Temp 98.3 F (36.8 C) (Oral)   Resp 18   Ht 5\' 4"  (1.626 m)   Wt 84 kg   SpO2 98%   BMI 31.79 kg/m   Physical Exam Vitals and nursing note reviewed.  Constitutional:      General: She is not in acute distress.    Appearance: She is well-developed and well-nourished.  HENT:     Head: Normocephalic and atraumatic.  Eyes:     Conjunctiva/sclera: Conjunctivae normal.  Cardiovascular:     Rate and Rhythm: Normal rate and regular rhythm.     Heart sounds: No murmur heard.   Pulmonary:     Effort: Pulmonary effort is normal. No respiratory distress.     Breath sounds: Normal breath sounds.  Abdominal:     Palpations: Abdomen is soft.     Tenderness: There is no abdominal tenderness.  Musculoskeletal:        General: No edema.     Cervical back: Neck supple.  Skin:    General: Skin is warm and dry.  Neurological:     General: No focal deficit present.     Mental Status: She is alert and oriented to person, place, and time.  Psychiatric:        Mood and Affect: Mood and affect and mood normal.     ED Results / Procedures / Treatments   Labs (all  labs ordered are listed, but only abnormal results are displayed) Labs Reviewed  CBC WITH DIFFERENTIAL/PLATELET  BASIC METABOLIC PANEL  URINALYSIS, ROUTINE W REFLEX MICROSCOPIC    EKG None  Radiology DG Chest Portable 1 View  Result Date: 06/04/2020 CLINICAL DATA:  Cough COVID-19 positivity, initial encounter EXAM: PORTABLE CHEST 1 VIEW COMPARISON:  10/27/2019 FINDINGS: The heart size and mediastinal contours are within normal limits. Both lungs are clear. The visualized  skeletal structures are unremarkable. IMPRESSION: No active disease. Electronically Signed   By: Inez Catalina M.D.   On: 06/04/2020 21:41    Procedures Procedures (including critical care time)  Medications Ordered in ED Medications  sodium chloride 0.9 % bolus 500 mL ( Intravenous Stopped 06/04/20 2308)    ED Course  I have reviewed the triage vital signs and the nursing notes.  Pertinent labs & imaging results that were available during my care of the patient were reviewed by me and considered in my medical decision making (see chart for details).    MDM Rules/Calculators/A&P                         75 year old lady with known COVID presents to ER with concern for fever.  On exam patient is well-appearing in no distress with normal vital signs.  Her fever has resolved after receiving antipyretic by EMS.  She has no acute complaints at present besides feeling generally unwell.  Will check basic labs, CXR.  While awaiting labs, signed out to Dr. Florina Ou.  If within normal limits, anticipate discharge back.  Final Clinical Impression(s) / ED Diagnoses Final diagnoses:  [id number]    Rx / DC Orders ED Discharge Orders    None       Lucrezia Starch, MD 06/05/20 [phone]

## 2020-06-05 NOTE — ED Notes (Signed)
[facility] left one of patients shoes and a bottle of tylenol when they came to pick up patient. [place] home to inform them. States they will call family to pick up. Will keep in cabinet in ED.

## 2021-02-03 DIAGNOSIS — E1122 Type 2 diabetes mellitus with diabetic chronic kidney disease: Secondary | ICD-10-CM | POA: Insufficient documentation

## 2021-02-03 DIAGNOSIS — I129 Hypertensive chronic kidney disease with stage 1 through stage 4 chronic kidney disease, or unspecified chronic kidney disease: Secondary | ICD-10-CM | POA: Insufficient documentation

## 2021-02-03 DIAGNOSIS — E875 Hyperkalemia: Secondary | ICD-10-CM | POA: Insufficient documentation

## 2021-04-21 DIAGNOSIS — I441 Atrioventricular block, second degree: Secondary | ICD-10-CM | POA: Insufficient documentation

## 2021-05-05 DIAGNOSIS — D351 Benign neoplasm of parathyroid gland: Secondary | ICD-10-CM | POA: Insufficient documentation

## 2021-05-05 DIAGNOSIS — M898X9 Other specified disorders of bone, unspecified site: Secondary | ICD-10-CM | POA: Insufficient documentation

## 2021-06-14 IMAGING — DX DG CHEST 1V PORT
1 series · 1 of 1 positions shown · non-contrast
Comparison: 10/27/2019

CLINICAL DATA: Cough [id number] positivity, initial encounter

EXAM:
PORTABLE CHEST 1 VIEW

[chest ap]
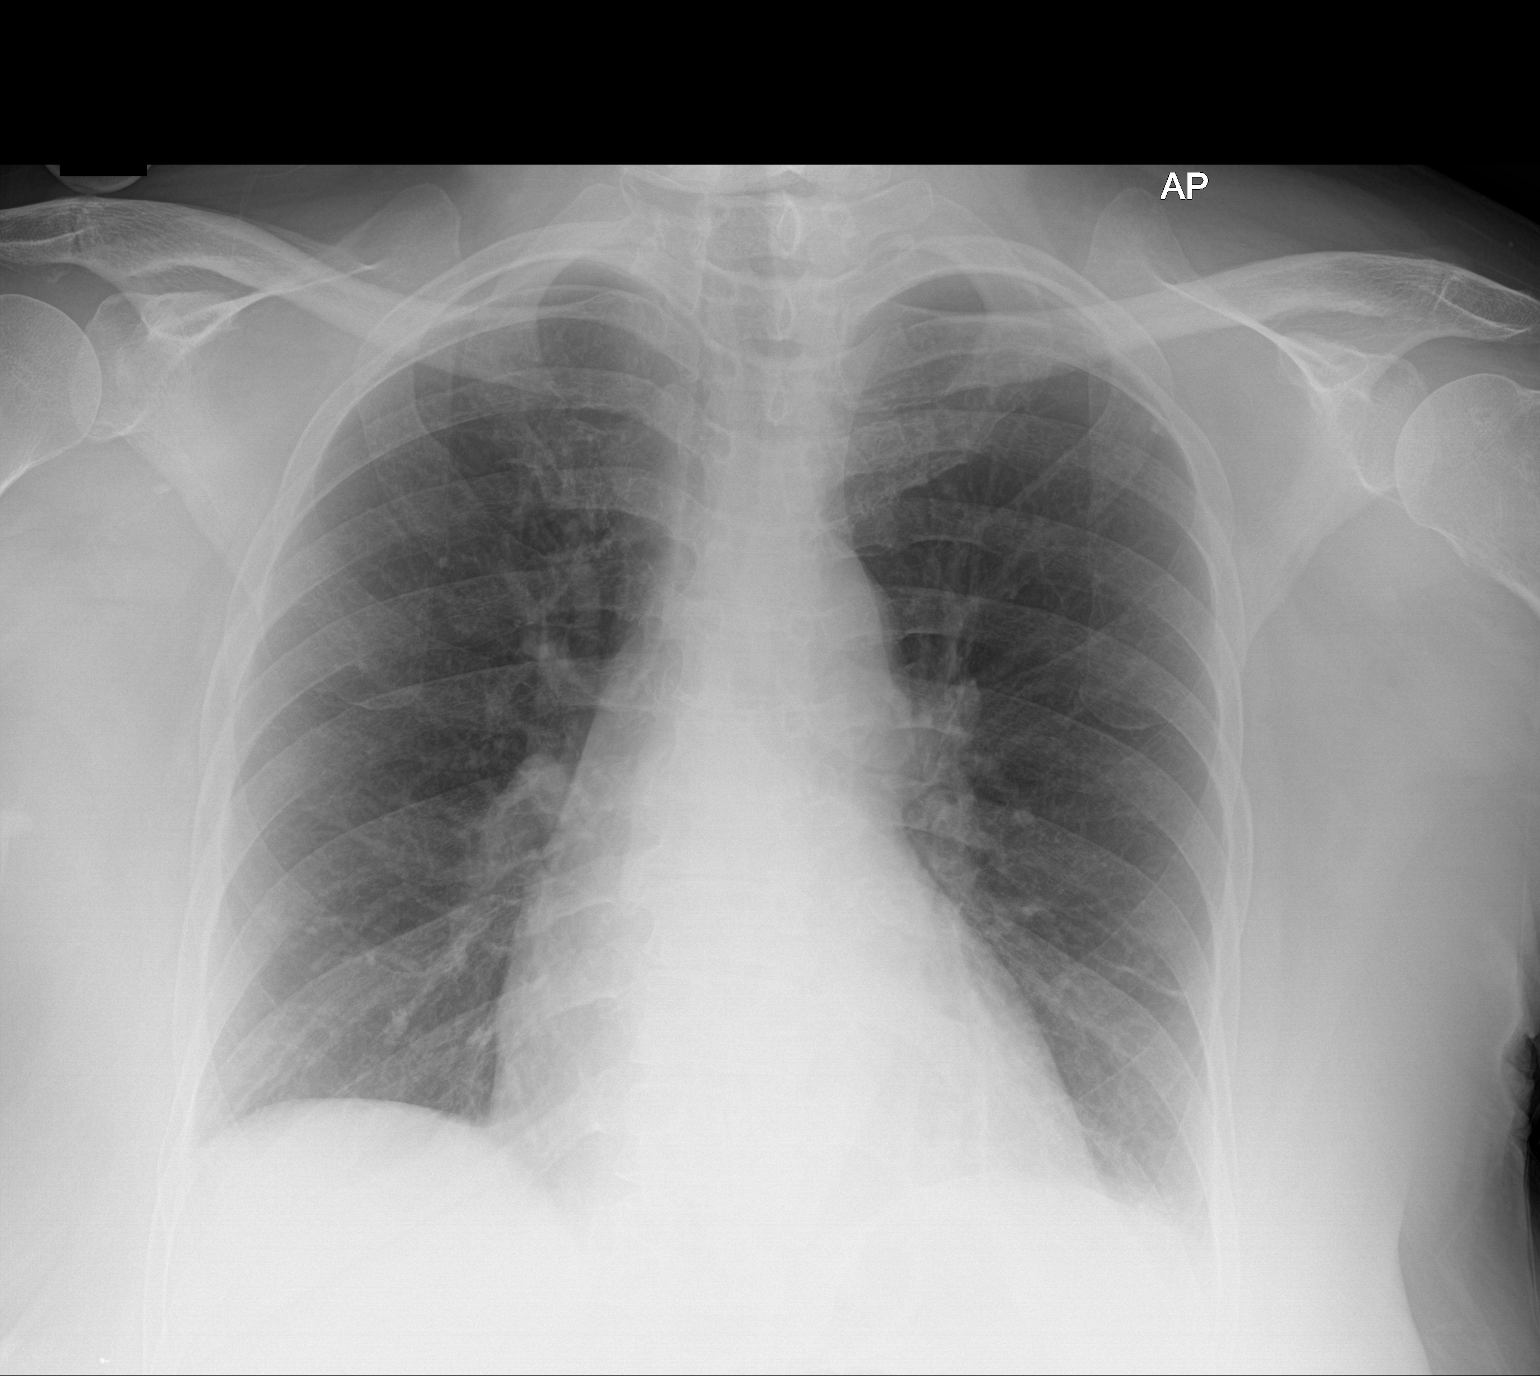

[1 of 1 positions shown; findings below may reference images not displayed]

FINDINGS: The heart size and mediastinal contours are within normal limits.
Both lungs are clear. The visualized skeletal structures are
unremarkable.
IMPRESSION: No active disease.

## 2021-11-04 ENCOUNTER — Encounter: Payer: Self-pay | Admitting: Podiatry

## 2021-11-04 ENCOUNTER — Ambulatory Visit (INDEPENDENT_AMBULATORY_CARE_PROVIDER_SITE_OTHER): Payer: Medicare Other | Admitting: Podiatry

## 2021-11-04 DIAGNOSIS — M2011 Hallux valgus (acquired), right foot: Secondary | ICD-10-CM

## 2021-11-04 DIAGNOSIS — N183 Chronic kidney disease, stage 3 unspecified: Secondary | ICD-10-CM | POA: Insufficient documentation

## 2021-11-04 DIAGNOSIS — G629 Polyneuropathy, unspecified: Secondary | ICD-10-CM | POA: Insufficient documentation

## 2021-11-04 DIAGNOSIS — Z872 Personal history of diseases of the skin and subcutaneous tissue: Secondary | ICD-10-CM

## 2021-11-04 DIAGNOSIS — E119 Type 2 diabetes mellitus without complications: Secondary | ICD-10-CM

## 2021-11-04 DIAGNOSIS — M2012 Hallux valgus (acquired), left foot: Secondary | ICD-10-CM

## 2021-11-04 DIAGNOSIS — M79674 Pain in right toe(s): Secondary | ICD-10-CM

## 2021-11-04 DIAGNOSIS — M79675 Pain in left toe(s): Secondary | ICD-10-CM

## 2021-11-04 DIAGNOSIS — E1142 Type 2 diabetes mellitus with diabetic polyneuropathy: Secondary | ICD-10-CM

## 2021-11-04 DIAGNOSIS — B351 Tinea unguium: Secondary | ICD-10-CM

## 2021-11-04 DIAGNOSIS — I509 Heart failure, unspecified: Secondary | ICD-10-CM | POA: Insufficient documentation

## 2021-11-14 NOTE — Progress Notes (Signed)
ANNUAL DIABETIC FOOT EXAM  Subjective: Samantha [name] presents today for annual diabetic foot examination. She is a resident of [facility] facility. Her daughter is present during today's visit.  Patient relates 35 year h/o diabetes.  Patient has h/o foot ulcer of right ankle, which healed via help of local wound care.  Patient has been diagnosed with neuropathy and it is managed with pregabalin.  Last known  HgA1c was around 7%. Patient states facility did not check blood glucose this morning.  Risk factors: diabetes, neuropathy, history of foot/leg ulcer, h/o CVA, HTN, CHF, diabetic renal disease.  Seward Carol, MD is patient's PCP. Last visit was June, 2023.  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   AV block, [place]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   Benign hypertension with CKD (chronic kidney disease) stage IV ([place]) 02/03/2021   Hyperkalemia 02/03/2021   Type 2 diabetes mellitus with diabetic chronic kidney disease ([place]) 02/03/2021   Impacted cerumen of right ear 07/09/2016   Cough 07/07/2016   Exposure to influenza 07/07/2016   AKI (acute kidney injury) ([place]) 06/28/2016   Pulmonary edema with congestive heart failure with preserved left ventricular function ([place]) 06/25/2016   Urinary retention 06/24/2016   Abdominal distension 06/23/2016   Bradycardia 06/23/2016   Delirium 06/23/2016   Normocytic anemia 06/23/2016   Acute renal failure superimposed on stage 3 chronic kidney  disease ([place]) 06/18/2016   Hypoglycemia associated with type 2 diabetes mellitus ([place]) 06/10/2016   Vitamin D deficiency 06/09/2016   At risk for falls 06/07/2016   Constipation, unspecified 06/06/2016   Hemorrhoids without complication 71/69/6789   Hx of completed stroke 06/06/2016   Hypercalcemia 06/06/2016   Mild intermittent asthma without complication 38/02/1750   Sleep apnea, unspecified 06/06/2016   Schizoaffective disorder, depressive type ([place]) 06/03/2016   Auditory hallucination    Paranoia ([place])    Visual hallucinations    Hyperglycemia 08/31/2013   Bipolar 1 disorder, depressed ([place]) 08/29/2013   Psychosis ([place]) 08/29/2013   Bipolar 1 disorder ([place]) 08/29/2013   Past Surgical History:  Procedure Laterality Date   CESAREAN SECTION     Current Outpatient Medications on File Prior to Visit  Medication Sig Dispense Refill   acetaminophen (TYLENOL) 500 MG tablet Take by mouth.     atorvastatin (LIPITOR) 20 MG tablet Take 1 tablet by mouth at bedtime.     diphenhydrAMINE (BENADRYL) 25 MG tablet Take by mouth.     EPINEPHrine 0.3 mg/0.3 mL IJ SOAJ injection Inject into the skin.     famotidine (PEPCID) 20 MG tablet Take by mouth.     haloperidol (HALDOL) 5 MG tablet Take by mouth.     latanoprost (XALATAN) 0.005 % ophthalmic solution Place 1 drop into both eyes nightly.     Melatonin 10 MG TABS Take by mouth.     Misc. Devices MISC New CPAP mask and supplies per patient preference for OSA.     omeprazole (PRILOSEC) 40  MG capsule Take by mouth.     Polyethyl Glycol-Propyl Glycol (SYSTANE OP) Apply to eye.     polyethylene glycol (MIRALAX / GLYCOLAX) 17 g packet Take by mouth.     Propylene Glycol (SYSTANE BALANCE) 0.6 % SOLN Place 1 drop into both eyes 3 times daily.     tamsulosin (FLOMAX) 0.4 MG CAPS capsule Take 1 capsule by mouth daily.     traZODone (DESYREL) 50 MG tablet Take by mouth.     albuterol (PROVENTIL HFA;VENTOLIN HFA) 108 (90 BASE) MCG/ACT inhaler Inhale 2  puffs into the lungs every 6 (six) hours as needed for wheezing. 1 Inhaler 7   ALPRAZolam (XANAX) 1 MG tablet Take 1 mg by mouth 3 (three) times daily.     amLODipine (NORVASC) 10 MG tablet Take 10 mg by mouth daily.     ANUSOL-HC 2.5 % rectal cream Apply 1 Application topically 2 (two) times daily as needed.     aspirin EC 81 MG tablet Take 81 mg by mouth daily.     budesonide-formoterol (SYMBICORT) 160-4.5 MCG/ACT inhaler Inhale 2 puffs into the lungs 2 (two) times daily.     busPIRone (BUSPAR) 15 MG tablet Take by mouth.     capsaicin (ZOSTRIX) 0.025 % cream SMARTSIG:sparingly Topical Every Night     carvedilol (COREG) 12.5 MG tablet Take 12.5 mg by mouth 2 (two) times daily.     cetirizine (ZYRTEC) 10 MG tablet Take by mouth.     cinacalcet (SENSIPAR) 90 MG tablet Take 90 mg by mouth daily.     cloNIDine (CATAPRES - DOSED IN MG/24 HR) 0.3 mg/24hr patch Place 1 patch onto the skin once a week.     D-5000 125 MCG (5000 UT) TABS Take 1 tablet by mouth daily.     DEPAKOTE 125 MG DR tablet Take by mouth 2 (two) times daily.     dextrose (GLUTOSE) 40 % GEL Take by mouth.     DULCOLAX 10 MG suppository Place 10 mg rectally daily as needed.     fluticasone (FLONASE) 50 MCG/ACT nasal spray 1 spray by Both Nostrils route 2 (two) times a day as needed for Rhinitis.     hydrALAZINE (APRESOLINE) 100 MG tablet Take 100 mg by mouth 3 (three) times daily.     hydrocortisone cream 1 % Apply 1 application topically 2 (two) times daily as needed for itching. For hemorrhoids     insulin detemir (LEVEMIR) 100 UNIT/ML injection Inject 32 units into the skin every morning and 23 units every evening     insulin glargine (LANTUS) 100 unit/mL SOPN Inject 10 U in the AM and 80 U in the PM (Patient taking differently: Inject 47-65 Units into the skin 2 (two) times daily. Inject 47 unitsin the AM and 65 units in the PM) 15 mL 11   levocetirizine (XYZAL) 5 MG tablet Take 5 mg by mouth daily.     Liraglutide (VICTOZA) 18  MG/3ML SOPN Inject 1.8 mg into the skin daily at 12 noon. 1 pen 7   magnesium oxide (MAG-OX) 400 MG tablet Take 1 tablet by mouth 2 (two) times daily.     methocarbamol (ROBAXIN) 500 MG tablet Take 500 mg by mouth 3 (three) times daily.     metoprolol (TOPROL-XL) 200 MG 24 hr tablet Take 200 mg by mouth daily.     MYRBETRIQ 50 MG TB24 tablet Take 50 mg by mouth daily.     ondansetron (ZOFRAN) 4 MG tablet Take by mouth.  pregabalin (LYRICA) 50 MG capsule Take by mouth.     PROAIR DIGIHALER 108 (90 Base) MCG/ACT AEPB Inhale into the lungs.     risperiDONE (RISPERDAL) 1 MG tablet Take 1.5 mg by mouth 2 (two) times daily.     ROBITUSSIN COUGH+CHEST CONG DM 20-400 MG/20ML LIQD Take 10 mLs by mouth every 6 (six) hours as needed.     senna (SENOKOT) 8.6 MG tablet Take 1 tablet by mouth 2 (two) times daily.     tiZANidine (ZANAFLEX) 2 MG tablet Take 2 mg by mouth 2 (two) times daily.      trimethoprim (TRIMPEX) 100 MG tablet Take 100 mg by mouth daily.     valsartan (DIOVAN) 160 MG tablet Take 160 mg by mouth daily.     venlafaxine (EFFEXOR) 75 MG tablet Take 75-150 mg by mouth 2 (two) times daily with a meal. Take 1 in the morning Take 2 at bedtime     VOLTAREN ARTHRITIS PAIN 1 % GEL SMARTSIG:4 Gram(s) Topical Twice Daily     No current facility-administered medications on file prior to visit.    Allergies  Allergen Reactions   Peanut-Containing Drug Products Anaphylaxis and Swelling   Alprazolam Other (See Comments)    weakness   Social History   Occupational History   Not on file  Tobacco Use   Smoking status: Never   Smokeless tobacco: Not on file  Substance and Sexual Activity   Alcohol use: No   Drug use: Not on file   Sexual activity: Not on file   History reviewed. No pertinent family history.  There is no immunization history on file for this patient.   Review of Systems: Negative except as noted in the HPI.   Objective: There were no vitals filed for this  visit.  Samantha [name] is a pleasant 76 y.o. female in NAD. AAO X 3.  Vascular Examination: CFT <4 seconds b/l LE. Faintly palpable DP pulses b/l LE. Faintly palpable PT pulse(s) b/l LE. Pedal hair absent. No pain with calf compression RLE. Lower extremity skin temperature gradient within normal limits. Trace edema noted BLE. No ischemia or gangrene noted b/l LE. No cyanosis or clubbing noted b/l LE.  Dermatological Examination: Scarring noted from area of previous pressure injury of right lateral malleolus. No erythema, no edema, no drainage, no fluctuance. Pedal skin is warm and supple b/l LE. No open wounds b/l LE. No interdigital macerations noted b/l LE. Toenails bilateral great toes elongated, discolored, dystrophic, thickened, and crumbly with subungual debris and tenderness to dorsal palpation. Nondystrophic toenails 2-5 bilaterally. No hyperkeratotic nor porokeratotic lesions present on today's visit.  Neurological Examination: Pt has subjective symptoms of neuropathy. Protective sensation intact 5/5 intact bilaterally with 10g monofilament b/l. Vibratory sensation intact b/l.  Musculoskeletal Examination: Noted disuse atrophy bilaterally. HAV with bunion deformity noted b/l LE. Wearing appropriate fitting shoe gear. Utilizes wheelchair for mobility assistance.  Footwear Assessment: Does the patient wear appropriate shoes? Yes. Does the patient need inserts/orthotics? No.  Assessment: 1. Pain due to onychomycosis of toenails of both feet   2. Hallux valgus, acquired, bilateral   3. History of foot ulcer   4. Diabetic peripheral neuropathy associated with type 2 diabetes mellitus ([place])   5. Encounter for diabetic foot exam ([place])     ADA Risk Categorization: High Risk  Patient has one or more of the following: Loss of protective sensation Absent pedal pulses Severe Foot deformity History of foot ulcer  Plan: -Patient was evaluated and treated.  All patient's and/or POA's  questions/concerns answered on today's visit. -Diabetic foot examination performed today. -Continue diabetic foot care principles: inspect feet daily, monitor glucose as recommended by PCP and/or Endocrinologist, and follow prescribed diet per PCP, Endocrinologist and/or dietician. -Discussed diabetic shoe benefit available based on patient's diagnoses. Patient/POA would like to proceed. Order entered for one pair extra depth shoes and 3 pair heat moldable insoles. Patient qualifies based on diagnoses. -Mycotic toenails bilateral great toes were debrided in length and girth with sterile nail nippers and dremel without iatrogenic bleeding. -Nondystrophic toenails trimmed 2-5 bilaterally. -Patient/POA to call should there be question/concern in the interim. Return in about 3 months (around 02/04/2022).  Marzetta Board, DPM

## 2022-02-04 ENCOUNTER — Emergency Department (HOSPITAL_COMMUNITY)
Admission: EM | Admit: 2022-02-04 | Discharge: 2022-02-04 | Disposition: A | Discharge status: Home / Self Care | Payer: Medicare Other | Attending: Emergency Medicine | Admitting: Emergency Medicine

## 2022-02-04 ENCOUNTER — Encounter (HOSPITAL_COMMUNITY): Payer: Self-pay

## 2022-02-04 ENCOUNTER — Other Ambulatory Visit: Payer: Self-pay

## 2022-02-04 DIAGNOSIS — Z7982 Long term (current) use of aspirin: Secondary | ICD-10-CM | POA: Insufficient documentation

## 2022-02-04 DIAGNOSIS — I10 Essential (primary) hypertension: Secondary | ICD-10-CM | POA: Diagnosis not present

## 2022-02-04 DIAGNOSIS — T7840XA Allergy, unspecified, initial encounter: Secondary | ICD-10-CM | POA: Diagnosis present

## 2022-02-04 DIAGNOSIS — Z91010 Allergy to peanuts: Secondary | ICD-10-CM | POA: Insufficient documentation

## 2022-02-04 DIAGNOSIS — Z79899 Other long term (current) drug therapy: Secondary | ICD-10-CM | POA: Insufficient documentation

## 2022-02-04 DIAGNOSIS — E119 Type 2 diabetes mellitus without complications: Secondary | ICD-10-CM | POA: Insufficient documentation

## 2022-02-04 DIAGNOSIS — Z794 Long term (current) use of insulin: Secondary | ICD-10-CM | POA: Diagnosis not present

## 2022-02-04 MED ORDER — DEXAMETHASONE SODIUM PHOSPHATE 10 MG/ML IJ SOLN
10.0000 mg | Freq: Once | INTRAMUSCULAR | Status: AC
  Administered 2022-02-04: 10 mg via INTRAVENOUS
  Filled 2022-02-04: qty 1

## 2022-02-04 MED ORDER — FAMOTIDINE IN NACL 20-0.9 MG/50ML-% IV SOLN
20.0000 mg | Freq: Once | INTRAVENOUS | Status: AC
  Administered 2022-02-04: 20 mg via INTRAVENOUS
  Filled 2022-02-04: qty 50

## 2022-02-04 NOTE — ED Provider Notes (Signed)
[facility] DEPT Provider Note   CSN: [id number] Arrival date & time: 02/04/22  1604     History  No chief complaint on file.   Samantha [name] is a 76 y.o. female with a past medical history of diabetes, hypertension who presents to the emergency department with concerns for allergic reaction onset 2 days.  Notes that she ate cauliflower and she is allergic to cauliflower.  She notes that she has been having tongue swelling for the past couple days.  Was given 1 Benadryl this morning as well as another Benadryl at 2 PM by her facility prior to arrival to the ED.  No additional meds tried prior to arrival.  No new medications, no ACE inhibitors, no new foods.  Denies chest pain, shortness of breath, trouble swallowing, lip swelling, rash.  The history is provided by the patient. No language interpreter was used.       Home Medications Prior to Admission medications   Medication Sig Start Date End Date Taking? Authorizing Provider  acetaminophen (TYLENOL) 500 MG tablet Take by mouth. 11/01/19   [provider]  albuterol (PROVENTIL HFA;VENTOLIN HFA) 108 (90 BASE) MCG/ACT inhaler Inhale 2 puffs into the lungs every 6 (six) hours as needed for wheezing. 09/02/13   Theodis Blaze, MD  ALPRAZolam Duanne Moron) 1 MG tablet Take 1 mg by mouth 3 (three) times daily.    [provider]  amLODipine (NORVASC) 10 MG tablet Take 10 mg by mouth daily. 10/22/21   [provider]  ANUSOL-HC 2.5 % rectal cream Apply 1 Application topically 2 (two) times daily as needed. 09/23/21   [provider]  aspirin EC 81 MG tablet Take 81 mg by mouth daily. 10/24/21   [provider]  atorvastatin (LIPITOR) 20 MG tablet Take 1 tablet by mouth at bedtime. 11/01/19   [provider]  budesonide-formoterol (SYMBICORT) 160-4.5 MCG/ACT inhaler Inhale 2 puffs into the lungs 2 (two) times daily.    [provider]  busPIRone (BUSPAR) 15 MG  tablet Take by mouth.    [provider]  capsaicin (ZOSTRIX) 0.025 % cream SMARTSIG:sparingly Topical Every Night 10/24/21   [provider]  carvedilol (COREG) 12.5 MG tablet Take 12.5 mg by mouth 2 (two) times daily. 10/24/21   [provider]  cetirizine (ZYRTEC) 10 MG tablet Take by mouth.    [provider]  cinacalcet (SENSIPAR) 90 MG tablet Take 90 mg by mouth daily. 10/15/21   [provider]  cloNIDine (CATAPRES - DOSED IN MG/24 HR) 0.3 mg/24hr patch Place 1 patch onto the skin once a week.    [provider]  D-5000 125 MCG (5000 UT) TABS Take 1 tablet by mouth daily. 10/24/21   [provider]  DEPAKOTE 125 MG DR tablet Take by mouth 2 (two) times daily. 09/23/21   [provider]  dextrose (GLUTOSE) 40 % GEL Take by mouth.    [provider]  diphenhydrAMINE (BENADRYL) 25 MG tablet Take by mouth. 10/06/18   [provider]  DULCOLAX 10 MG suppository Place 10 mg rectally daily as needed. 10/24/21   [provider]  EPINEPHrine 0.3 mg/0.3 mL IJ SOAJ injection Inject into the skin. 08/04/17   [provider]  famotidine (PEPCID) 20 MG tablet Take by mouth. 10/06/18   [provider]  fluticasone (FLONASE) 50 MCG/ACT nasal spray 1 spray by Both Nostrils route 2 (two) times a day as needed for Rhinitis.    [provider]  haloperidol (HALDOL) 5 MG tablet Take by mouth. 11/01/19   [provider]  hydrALAZINE (APRESOLINE) 100 MG tablet Take 100 mg by mouth 3 (three) times daily. 10/12/21   [provider]  hydrocortisone cream 1 % Apply 1 application topically 2 (two) times daily as needed for itching. For hemorrhoids    [provider]  insulin detemir (LEVEMIR) 100 UNIT/ML injection Inject 32 units into the skin every morning and 23 units every evening    [provider]  insulin glargine (LANTUS) 100 unit/mL SOPN Inject 10 U in the AM and  80 U in the PM Patient taking differently: Inject 47-65 Units into the skin 2 (two) times daily. Inject 47 unitsin the AM and 65 units in the PM 09/02/13   Theodis Blaze, MD  latanoprost (XALATAN) 0.005 % ophthalmic solution Place 1 drop into both eyes nightly. 11/01/19   [provider]  levocetirizine (XYZAL) 5 MG tablet Take 5 mg by mouth daily. 10/31/21   [provider]  Liraglutide (VICTOZA) 18 MG/3ML SOPN Inject 1.8 mg into the skin daily at 12 noon. 09/02/13   Theodis Blaze, MD  magnesium oxide (MAG-OX) 400 MG tablet Take 1 tablet by mouth 2 (two) times daily. 10/24/21   [provider]  Melatonin 10 MG TABS Take by mouth. 07/14/16   [provider]  methocarbamol (ROBAXIN) 500 MG tablet Take 500 mg by mouth 3 (three) times daily. 10/28/21   [provider]  metoprolol (TOPROL-XL) 200 MG 24 hr tablet Take 200 mg by mouth daily.    [provider]  Misc. Devices MISC New CPAP mask and supplies per patient preference for OSA. 10/21/18   [provider]  MYRBETRIQ 50 MG TB24 tablet Take 50 mg by mouth daily. 10/11/21   [provider]  omeprazole (PRILOSEC) 40 MG capsule Take by mouth. 11/01/19   [provider]  ondansetron (ZOFRAN) 4 MG tablet Take by mouth. 07/13/21   [provider]  Polyethyl Glycol-Propyl Glycol (SYSTANE OP) Apply to eye.    [provider]  polyethylene glycol (MIRALAX / GLYCOLAX) 17 g packet Take by mouth. 11/02/19   [provider]  pregabalin (LYRICA) 50 MG capsule Take by mouth.    [provider]  PROAIR DIGIHALER 108 [phone] Base) MCG/ACT AEPB Inhale into the lungs. 06/03/21   [provider]  Propylene Glycol (SYSTANE BALANCE) 0.6 % SOLN Place 1 drop into both eyes 3 times daily. 11/01/19   [provider]  risperiDONE (RISPERDAL) 1 MG tablet Take 1.5 mg by mouth 2 (two) times daily.    [provider]  ROBITUSSIN COUGH+CHEST CONG DM  20-400 MG/20ML LIQD Take 10 mLs by mouth every 6 (six) hours as needed. 10/24/21   [provider]  senna (SENOKOT) 8.6 MG tablet Take 1 tablet by mouth 2 (two) times daily.    [provider]  tamsulosin (FLOMAX) 0.4 MG CAPS capsule Take 1 capsule by mouth daily. 11/02/19   [provider]  tiZANidine (ZANAFLEX) 2 MG tablet Take 2 mg by mouth 2 (two) times daily.  09/08/15   [provider]  traZODone (DESYREL) 50 MG tablet Take by mouth. 06/21/18   [provider]  trimethoprim (TRIMPEX) 100 MG tablet Take 100 mg by mouth daily. 11/01/21   [provider]  valsartan (DIOVAN) 160 MG tablet Take 160 mg by mouth daily. 05/12/21   [provider]  venlafaxine (EFFEXOR) 75 MG  tablet Take 75-150 mg by mouth 2 (two) times daily with a meal. Take 1 in the morning Take 2 at bedtime    [provider]  VOLTAREN ARTHRITIS PAIN 1 % GEL SMARTSIG:4 Gram(s) Topical Twice Daily 10/24/21   [provider]      Allergies    Peanut-containing drug products, Alprazolam, and Cauliflower [brassica oleracea]    Review of Systems   Review of Systems  HENT:  Negative for trouble swallowing.   Respiratory:  Negative for shortness of breath.   Cardiovascular:  Negative for chest pain.  All other systems reviewed and are negative.   Physical Exam Updated Vital Signs BP (!) 140/79   Pulse 70   Temp 98.2 F (36.8 C) (Oral)   Resp 16   SpO2 100%  Physical Exam Vitals and nursing note reviewed.  Constitutional:      General: She is not in acute distress.    Appearance: She is not diaphoretic.  HENT:     Head: Normocephalic and atraumatic.     Mouth/Throat:     Mouth: Mucous membranes are moist.     Pharynx: Oropharynx is clear. Uvula midline. No oropharyngeal exudate, posterior oropharyngeal erythema or uvula swelling.     Tonsils: No tonsillar exudate.     Comments: Uvula midline without swelling.  No posterior pharyngeal erythema  or tonsillar exudate noted.  No discernible swelling noted to tongue.  No swelling noted to lips. Eyes:     General: No scleral icterus.    Conjunctiva/sclera: Conjunctivae normal.  Cardiovascular:     Rate and Rhythm: Normal rate and regular rhythm.     Pulses: Normal pulses.     Heart sounds: Normal heart sounds.  Pulmonary:     Effort: Pulmonary effort is normal. No respiratory distress.     Breath sounds: Normal breath sounds. No wheezing.  Abdominal:     General: Bowel sounds are normal.     Palpations: Abdomen is soft. There is no mass.     Tenderness: There is no abdominal tenderness. There is no guarding or rebound.  Musculoskeletal:        General: Normal range of motion.     Cervical back: Normal range of motion and neck supple.  Skin:    General: Skin is warm and dry.  Neurological:     Mental Status: She is alert.  Psychiatric:        Behavior: Behavior normal.     ED Results / Procedures / Treatments   Labs (all labs ordered are listed, but only abnormal results are displayed) Labs Reviewed - No data to display  EKG None  Radiology No results found.  Procedures Procedures    Medications Ordered in ED Medications  famotidine (PEPCID) IVPB 20 mg premix (0 mg Intravenous Stopped 02/04/22 1733)  dexamethasone (DECADRON) injection 10 mg (10 mg Intravenous Given 02/04/22 1702)    ED Course/ Medical Decision Making/ A&P Clinical Course as of 02/04/22 2051  Sat Feb 04, 2022  1930 Attending evaluated patient and agrees with treatment regimen in the ED and discharge treatment plan. [SB]  2002 Patient reevaluated noted symptoms improved with treatment treatment in the ED.  Discussed discharge treatment plan with patient as well as daughter at bedside.  Daughter notes that she will take patient back to facility.  Patient appears safe for discharge at this time. [SB]    Clinical Course User Index [SB] Dequavius Kuhner A, PA-C  Medical  Decision Making Risk Prescription drug management.   Pt presents with tongue swelling onset 2 days.  Patient notes that she ate something that possibly had cauliflower in it which she is allergic to.  Patient has been given Benadryl for symptoms since they started.  Has received a total of 50 mg of Benadryl today.  Vital signs, patient afebrile. On exam, pt with uvula midline without swelling.  No posterior pharyngeal erythema or tonsillar exudate noted.  No discernible swelling noted to tongue.  No swelling noted to lips. No acute cardiovascular, respiratory exam findings. Differential diagnosis includes allergic reaction, contact dermatitis, anaphylaxis.    Co morbidities that complicate the patient evaluation: Diabetes, hypertension, CHF  Medications:  I ordered medication including Pepcid, Decadron for symptom management Reevaluation of the patient after these medicines and interventions, I reevaluated the patient and found that they have improved I have reviewed the patients home medicines and have made adjustments as needed   Disposition: Presentation suspicious for allergic reaction.  Doubt anaphylaxis or contact dermatitis at this time. After consideration of the diagnostic results and the patients response to treatment, I feel that the patient would benefit from Discharge home.  Attending evaluated patient and agrees with discharge treatment plan.  Patient's daughter at bedside and discussed with daughter regarding treatment regimen in the ED and discharge plans back to facility.  Daughter notes that she will drive the patient back to the facility.  Supportive care measures and strict return precautions discussed with patient at bedside. Pt acknowledges and verbalizes understanding. Pt appears safe for discharge. Follow up as indicated in discharge paperwork.    This chart was dictated using voice recognition software, Dragon. Despite the best efforts of this provider to proofread and  correct errors, errors may still occur which can change documentation meaning.   Final Clinical Impression(s) / ED Diagnoses Final diagnoses:  Allergic reaction, initial encounter    Rx / DC Orders ED Discharge Orders     None         Dona Klemann A, PA-C 02/04/22 2051    Oneal Deputy K, DO 02/04/22 2316

## 2022-02-04 NOTE — ED Triage Notes (Signed)
"  Nurse at facility stated that she is allergic to cauliflower and accidentally ate some yesterday and now her tongue is swollen" per EMS

## 2022-02-04 NOTE — Discharge Instructions (Signed)
It was a pleasure take care of you today!  You were treated today with Decadron as well as Pepcid.  The Decadron may make your sugar levels increase for the next couple of days.  You may follow-up with your primary care provider as needed.  Return to the emergency department experience increasing/worsening symptoms.

## 2022-02-04 NOTE — ED Notes (Signed)
Pt c/o tongue swelling for a "couple days after tasting cauliflower."

## 2022-02-13 ENCOUNTER — Other Ambulatory Visit: Payer: Medicare Other

## 2022-02-13 ENCOUNTER — Ambulatory Visit: Payer: Medicare Other | Admitting: Podiatry

## 2022-12-31 ENCOUNTER — Inpatient Hospital Stay (HOSPITAL_COMMUNITY)
Admission: EM | Admit: 2022-12-31 | Discharge: 2023-01-04 | DRG: 682 | Disposition: A | Discharge status: Skilled Nursing Facility | Payer: Medicare Other | Source: Skilled Nursing Facility | Attending: Internal Medicine | Admitting: Internal Medicine

## 2022-12-31 ENCOUNTER — Emergency Department (HOSPITAL_COMMUNITY): Payer: Medicare Other

## 2022-12-31 ENCOUNTER — Other Ambulatory Visit: Payer: Self-pay

## 2022-12-31 DIAGNOSIS — N184 Chronic kidney disease, stage 4 (severe): Secondary | ICD-10-CM | POA: Diagnosis present

## 2022-12-31 DIAGNOSIS — Z9109 Other allergy status, other than to drugs and biological substances: Secondary | ICD-10-CM

## 2022-12-31 DIAGNOSIS — E785 Hyperlipidemia, unspecified: Secondary | ICD-10-CM | POA: Diagnosis present

## 2022-12-31 DIAGNOSIS — Z888 Allergy status to other drugs, medicaments and biological substances status: Secondary | ICD-10-CM | POA: Diagnosis not present

## 2022-12-31 DIAGNOSIS — R0609 Other forms of dyspnea: Secondary | ICD-10-CM | POA: Diagnosis not present

## 2022-12-31 DIAGNOSIS — N3090 Cystitis, unspecified without hematuria: Secondary | ICD-10-CM

## 2022-12-31 DIAGNOSIS — W19XXXA Unspecified fall, initial encounter: Secondary | ICD-10-CM | POA: Diagnosis not present

## 2022-12-31 DIAGNOSIS — R296 Repeated falls: Secondary | ICD-10-CM | POA: Diagnosis present

## 2022-12-31 DIAGNOSIS — T434X5A Adverse effect of butyrophenone and thiothixene neuroleptics, initial encounter: Secondary | ICD-10-CM | POA: Diagnosis present

## 2022-12-31 DIAGNOSIS — R531 Weakness: Secondary | ICD-10-CM | POA: Diagnosis not present

## 2022-12-31 DIAGNOSIS — Z993 Dependence on wheelchair: Secondary | ICD-10-CM | POA: Diagnosis not present

## 2022-12-31 DIAGNOSIS — Z7189 Other specified counseling: Secondary | ICD-10-CM | POA: Diagnosis not present

## 2022-12-31 DIAGNOSIS — F319 Bipolar disorder, unspecified: Secondary | ICD-10-CM | POA: Diagnosis not present

## 2022-12-31 DIAGNOSIS — I959 Hypotension, unspecified: Secondary | ICD-10-CM | POA: Diagnosis not present

## 2022-12-31 DIAGNOSIS — Z7982 Long term (current) use of aspirin: Secondary | ICD-10-CM

## 2022-12-31 DIAGNOSIS — G4733 Obstructive sleep apnea (adult) (pediatric): Secondary | ICD-10-CM | POA: Diagnosis present

## 2022-12-31 DIAGNOSIS — W06XXXA Fall from bed, initial encounter: Secondary | ICD-10-CM | POA: Diagnosis present

## 2022-12-31 DIAGNOSIS — E1140 Type 2 diabetes mellitus with diabetic neuropathy, unspecified: Secondary | ICD-10-CM | POA: Diagnosis present

## 2022-12-31 DIAGNOSIS — N179 Acute kidney failure, unspecified: Secondary | ICD-10-CM | POA: Diagnosis present

## 2022-12-31 DIAGNOSIS — Z7951 Long term (current) use of inhaled steroids: Secondary | ICD-10-CM

## 2022-12-31 DIAGNOSIS — G4730 Sleep apnea, unspecified: Secondary | ICD-10-CM | POA: Diagnosis not present

## 2022-12-31 DIAGNOSIS — R569 Unspecified convulsions: Secondary | ICD-10-CM | POA: Diagnosis not present

## 2022-12-31 DIAGNOSIS — K219 Gastro-esophageal reflux disease without esophagitis: Secondary | ICD-10-CM | POA: Diagnosis present

## 2022-12-31 DIAGNOSIS — J4489 Other specified chronic obstructive pulmonary disease: Secondary | ICD-10-CM | POA: Diagnosis present

## 2022-12-31 DIAGNOSIS — G9340 Encephalopathy, unspecified: Secondary | ICD-10-CM

## 2022-12-31 DIAGNOSIS — F259 Schizoaffective disorder, unspecified: Secondary | ICD-10-CM | POA: Diagnosis present

## 2022-12-31 DIAGNOSIS — Z6833 Body mass index (BMI) 33.0-33.9, adult: Secondary | ICD-10-CM

## 2022-12-31 DIAGNOSIS — Z79899 Other long term (current) drug therapy: Secondary | ICD-10-CM | POA: Diagnosis not present

## 2022-12-31 DIAGNOSIS — E860 Dehydration: Secondary | ICD-10-CM | POA: Diagnosis present

## 2022-12-31 DIAGNOSIS — N3000 Acute cystitis without hematuria: Secondary | ICD-10-CM | POA: Diagnosis not present

## 2022-12-31 DIAGNOSIS — I459 Conduction disorder, unspecified: Secondary | ICD-10-CM | POA: Diagnosis present

## 2022-12-31 DIAGNOSIS — N1832 Chronic kidney disease, stage 3b: Secondary | ICD-10-CM

## 2022-12-31 DIAGNOSIS — N189 Chronic kidney disease, unspecified: Secondary | ICD-10-CM

## 2022-12-31 DIAGNOSIS — I129 Hypertensive chronic kidney disease with stage 1 through stage 4 chronic kidney disease, or unspecified chronic kidney disease: Secondary | ICD-10-CM | POA: Diagnosis not present

## 2022-12-31 DIAGNOSIS — F3130 Bipolar disorder, current episode depressed, mild or moderate severity, unspecified: Secondary | ICD-10-CM | POA: Diagnosis present

## 2022-12-31 DIAGNOSIS — E1122 Type 2 diabetes mellitus with diabetic chronic kidney disease: Secondary | ICD-10-CM

## 2022-12-31 DIAGNOSIS — N390 Urinary tract infection, site not specified: Secondary | ICD-10-CM | POA: Diagnosis present

## 2022-12-31 DIAGNOSIS — I130 Hypertensive heart and chronic kidney disease with heart failure and stage 1 through stage 4 chronic kidney disease, or unspecified chronic kidney disease: Secondary | ICD-10-CM | POA: Diagnosis present

## 2022-12-31 DIAGNOSIS — E669 Obesity, unspecified: Secondary | ICD-10-CM | POA: Diagnosis present

## 2022-12-31 DIAGNOSIS — Z87440 Personal history of urinary (tract) infections: Secondary | ICD-10-CM

## 2022-12-31 DIAGNOSIS — I441 Atrioventricular block, second degree: Secondary | ICD-10-CM | POA: Diagnosis present

## 2022-12-31 DIAGNOSIS — M542 Cervicalgia: Secondary | ICD-10-CM | POA: Diagnosis present

## 2022-12-31 DIAGNOSIS — I509 Heart failure, unspecified: Secondary | ICD-10-CM | POA: Diagnosis not present

## 2022-12-31 DIAGNOSIS — I5032 Chronic diastolic (congestive) heart failure: Secondary | ICD-10-CM | POA: Diagnosis present

## 2022-12-31 DIAGNOSIS — M1990 Unspecified osteoarthritis, unspecified site: Secondary | ICD-10-CM | POA: Diagnosis present

## 2022-12-31 DIAGNOSIS — G9341 Metabolic encephalopathy: Secondary | ICD-10-CM | POA: Diagnosis present

## 2022-12-31 DIAGNOSIS — N183 Chronic kidney disease, stage 3 unspecified: Secondary | ICD-10-CM | POA: Diagnosis not present

## 2022-12-31 DIAGNOSIS — Z7401 Bed confinement status: Secondary | ICD-10-CM

## 2022-12-31 DIAGNOSIS — Z91010 Allergy to peanuts: Secondary | ICD-10-CM

## 2022-12-31 DIAGNOSIS — Z8673 Personal history of transient ischemic attack (TIA), and cerebral infarction without residual deficits: Secondary | ICD-10-CM

## 2022-12-31 DIAGNOSIS — S42302S Unspecified fracture of shaft of humerus, left arm, sequela: Secondary | ICD-10-CM

## 2022-12-31 DIAGNOSIS — Z794 Long term (current) use of insulin: Secondary | ICD-10-CM | POA: Diagnosis not present

## 2022-12-31 DIAGNOSIS — Z1152 Encounter for screening for COVID-19: Secondary | ICD-10-CM

## 2022-12-31 LAB — HEMOGLOBIN A1C
Hgb A1c MFr Bld: 6.5 % — ABNORMAL HIGH (ref 4.8–5.6)
Mean Plasma Glucose: 139.85 mg/dL

## 2022-12-31 LAB — COMPREHENSIVE METABOLIC PANEL
ALT: 8 U/L (ref 0–44)
AST: 14 U/L — ABNORMAL LOW (ref 15–41)
Albumin: 3.9 g/dL (ref 3.5–5.0)
Alkaline Phosphatase: 66 U/L (ref 38–126)
Anion gap: 15 (ref 5–15)
BUN: 61 mg/dL — ABNORMAL HIGH (ref 8–23)
CO2: 24 mmol/L (ref 22–32)
Calcium: 11.3 mg/dL — ABNORMAL HIGH (ref 8.9–10.3)
Chloride: 99 mmol/L (ref 98–111)
Creatinine, Ser: 3.54 mg/dL — ABNORMAL HIGH (ref 0.44–1.00)
GFR, Estimated: 13 mL/min — ABNORMAL LOW (ref >60)
Glucose, Bld: 113 mg/dL — ABNORMAL HIGH (ref 70–99)
Potassium: 5 mmol/L (ref 3.5–5.1)
Sodium: 138 mmol/L (ref 135–145)
Total Bilirubin: 0.2 mg/dL — ABNORMAL LOW (ref 0.3–1.2)
Total Protein: 7.7 g/dL (ref 6.5–8.1)

## 2022-12-31 LAB — I-STAT CHEM 8, ED
BUN: 61 mg/dL — ABNORMAL HIGH (ref 8–23)
Calcium, Ion: 1.4 mmol/L (ref 1.15–1.40)
Chloride: 105 mmol/L (ref 98–111)
Creatinine, Ser: 3.7 mg/dL — ABNORMAL HIGH (ref 0.44–1.00)
Glucose, Bld: 109 mg/dL — ABNORMAL HIGH (ref 70–99)
HCT: 37 % (ref 36.0–46.0)
Hemoglobin: 12.6 g/dL (ref 12.0–15.0)
Potassium: 5.1 mmol/L (ref 3.5–5.1)
Sodium: 137 mmol/L (ref 135–145)
TCO2: 26 mmol/L (ref 22–32)

## 2022-12-31 LAB — BLOOD GAS, VENOUS
Acid-Base Excess: 1.7 mmol/L (ref 0.0–2.0)
Bicarbonate: 28.2 mmol/L — ABNORMAL HIGH (ref 20.0–28.0)
O2 Saturation: 62.4 %
Patient temperature: 37
pCO2, Ven: 51 mmHg (ref 44–60)
pH, Ven: 7.35 (ref 7.25–7.43)
pO2, Ven: 37 mmHg (ref 32–45)

## 2022-12-31 LAB — URINALYSIS, W/ REFLEX TO CULTURE (INFECTION SUSPECTED)
Bilirubin Urine: NEGATIVE
Glucose, UA: NEGATIVE mg/dL
Hgb urine dipstick: NEGATIVE
Ketones, ur: NEGATIVE mg/dL
Nitrite: POSITIVE — AB
Protein, ur: NEGATIVE mg/dL
Specific Gravity, Urine: 1.012 (ref 1.005–1.030)
pH: 5 (ref 5.0–8.0)

## 2022-12-31 LAB — I-STAT CG4 LACTIC ACID, ED: Lactic Acid, Venous: 1.8 mmol/L (ref 0.5–1.9)

## 2022-12-31 LAB — CBC
HCT: 36.1 % (ref 36.0–46.0)
Hemoglobin: 11.3 g/dL — ABNORMAL LOW (ref 12.0–15.0)
MCH: 27.2 pg (ref 26.0–34.0)
MCHC: 31.3 g/dL (ref 30.0–36.0)
MCV: 86.8 fL (ref 80.0–100.0)
Platelets: 213 10*3/uL (ref 150–400)
RBC: 4.16 MIL/uL (ref 3.87–5.11)
RDW: 14.6 % (ref 11.5–15.5)
WBC: 11.1 10*3/uL — ABNORMAL HIGH (ref 4.0–10.5)
nRBC: 0 % (ref 0.0–0.2)

## 2022-12-31 LAB — PROTIME-INR
INR: 1 (ref 0.8–1.2)
Prothrombin Time: 12.9 seconds (ref 11.4–15.2)

## 2022-12-31 LAB — TROPONIN I (HIGH SENSITIVITY): Troponin I (High Sensitivity): 9 ng/L (ref <18)

## 2022-12-31 LAB — CK: Total CK: 50 U/L (ref 38–234)

## 2022-12-31 LAB — MAGNESIUM: Magnesium: 2.5 mg/dL — ABNORMAL HIGH (ref 1.7–2.4)

## 2022-12-31 MED ORDER — FLUCONAZOLE 50 MG PO TABS
50.0000 mg | ORAL_TABLET | Freq: Once | ORAL | Status: AC
  Administered 2022-12-31: 50 mg via ORAL
  Filled 2022-12-31: qty 1

## 2022-12-31 MED ORDER — SODIUM CHLORIDE 0.9 % IV BOLUS
500.0000 mL | Freq: Once | INTRAVENOUS | Status: AC
  Administered 2022-12-31: 500 mL via INTRAVENOUS

## 2022-12-31 MED ORDER — SODIUM CHLORIDE 0.9 % IV SOLN
1.0000 g | Freq: Once | INTRAVENOUS | Status: AC
  Administered 2022-12-31: 1 g via INTRAVENOUS
  Filled 2022-12-31: qty 20

## 2022-12-31 MED ORDER — ACETAMINOPHEN 325 MG PO TABS
650.0000 mg | ORAL_TABLET | Freq: Once | ORAL | Status: AC
  Administered 2022-12-31: 650 mg via ORAL
  Filled 2022-12-31: qty 2

## 2022-12-31 MED ORDER — SODIUM CHLORIDE 0.9 % IV SOLN
1.0000 g | Freq: Once | INTRAVENOUS | Status: AC
  Administered 2022-12-31: 1 g via INTRAVENOUS
  Filled 2022-12-31: qty 10

## 2022-12-31 MED ORDER — SODIUM CHLORIDE 0.9 % IV SOLN
500.0000 mg | Freq: Two times a day (BID) | INTRAVENOUS | Status: DC
  Administered 2023-01-01: 500 mg via INTRAVENOUS
  Filled 2022-12-31 (×3): qty 10

## 2022-12-31 MED ORDER — INSULIN ASPART 100 UNIT/ML IJ SOLN: 0.0000 [IU] | INTRAMUSCULAR | Status: DC

## 2022-12-31 MED ORDER — LACTATED RINGERS IV BOLUS
500.0000 mL | Freq: Once | INTRAVENOUS | Status: AC
  Administered 2022-12-31: 500 mL via INTRAVENOUS

## 2022-12-31 NOTE — Assessment & Plan Note (Addendum)
-   most likely multifactorial secondary to combination of  infection  dehydration secondary to decreased by mouth intake,  polypharmacy   - Will rehydrate   - treat underlining infection   - Hold contributing medications   -   MRI of the brain non acute  - neurological exam appears to be nonfocal but patient unable to cooperate fully   - VBG  and ammonia pending

## 2022-12-31 NOTE — Subjective & Objective (Signed)
Patient was brought in from [facility] unwitnessed fall she has known history of fracture of the left arm secondary to previous fall It is unclear for how long patient was down at baseline she is bedbound Has recently been diagnosed with UTI and has been treated for this Patient states that she rolled off the bed no LOC Patient states that she landed on her head she does report some neck pain Patient is somewhat confused

## 2022-12-31 NOTE — Assessment & Plan Note (Signed)
Hold ARB given worsening renal function and uptrending potassium

## 2022-12-31 NOTE — H&P (Signed)
Samantha [name] QQV:[id number] DOB: Oct 04, 1945 DOA: 12/31/2022     PCP: Renford Dills, MD      Patient arrived to ER on 12/31/22 at 1352 Referred by Attending Gloris Manchester, MD   Patient coming from:     From facility [facility]    Chief Complaint:   Chief Complaint  Patient presents with   Fall    HPI: Samantha [name] is a 77 y.o. female with medical history significant of bipolar, CKD stage 4, CHF, HTN, DM2, CVA, hypercalcemia, obesity , GERD, neuropathy, Mobitz type I heart block    Presented with  fall and confusion Patient was brought in from [facility] unwitnessed fall she has known history of fracture of the left arm secondary to previous fall It is unclear for how long patient was down at baseline she is bedbound Has recently been diagnosed with UTI and has been treated for this Patient states that she rolled off the bed no LOC Patient states that she landed on her head she does report some neck pain Patient is somewhat confused    UA Urine culture grew ESBL that is resistant to "everything" Seems pt was prescribed IM ertepenem   Denies significant ETOH intake   Does not smoke   No results found for: "SARSCOV2NAA"      Regarding pertinent Chronic problems:     Hyperlipidemia -  on statins Lipitor (atorvastatin)      HTN on Norvasc clonidine, hydralazine  Bipolar on Depakote, Haldol      DM 2 -  Lab Results  Component Value Date   HGBA1C 9.1 (H) 09/01/2013   Not on       COPD - not  followed by pulmonology   not  on baseline oxygen       CKD stage IIIb baseline Cr 2.0 CrCl cannot be calculated (Unknown ideal weight.).  Lab Results  Component Value Date   CREATININE 3.70 (H) 12/31/2022   CREATININE 3.54 (H) 12/31/2022   CREATININE 2.01 (H) 06/04/2020     Hepatic Function Panel     Component Value Date/Time   PROT 7.7 12/31/2022 1425   ALBUMIN 3.9 12/31/2022 1425   AST 14 (L) 12/31/2022 1425   ALT 8 12/31/2022 1425   ALKPHOS 66  12/31/2022 1425   BILITOT 0.2 (L) 12/31/2022 1425     BPH - on Flomax,       Chronic anemia - baseline hg Hemoglobin & Hematocrit  Recent Labs    12/31/22 1425 12/31/22 1454  HGB 11.3* 12.6   Iron/TIBC/Ferritin/ %Sat No results found for: "IRON", "TIBC", "FERRITIN", "IRONPCTSAT"     While in ER:    Showing persistent UTI worsening renal function    Lab Orders         Blood culture (routine x 2)         Comprehensive metabolic panel         CBC         Ethanol         Protime-INR         Magnesium         CK         Urinalysis, w/ Reflex to Culture (Infection Suspected) -Urine, Clean Catch         Valproic acid level         I-Stat Chem 8, ED         I-Stat Lactic Acid, ED      CT HEAD Old infarct in the  right occipital lobe.  Neck non acute CT lumbar - 1. No acute fracture or traumatic listhesis of the thoracic or lumbar spine. 2. Multilevel lumbar spondylosis, worst at L3-4 and L4-5, where there is at least mild spinal canal stenosis.  Thoracic spine  1. No acute fracture or traumatic listhesis of the thoracic or lumbar spine. 2. Multilevel lumbar spondylosis, worst at L3-4 and L4-5, where there is at least mild spinal canal stenosis.    Left shoulder 1. No acute osseous injury of the left shoulder.    CT  chest CTabd/pelvis-   1. No acute intracranial abnormality. 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 2. No acute fracture or traumatic listhesis of the cervical spine. 3. No acute traumatic findings in the chest, abdomen, or pelvis. 4. Coronary artery disease.   L elbow - negative Pelvis non acute  MRI - No acute intracranial abnormality. 2. Chronic right PCA distribution infarct. 3. 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  Following Medications were ordered in ER: Medications  lactated ringers bolus 500 mL (has no administration in time range)  fluconazole (DIFLUCAN) tablet 50 mg (50 mg Oral  Given 12/31/22 2043)  acetaminophen (TYLENOL) tablet 650 mg (650 mg Oral Given 12/31/22 1953)  cefTRIAXone (ROCEPHIN) 1 g in sodium chloride 0.9 % 100 mL IVPB (0 g Intravenous Stopped 12/31/22 2038)  sodium chloride 0.9 % bolus 500 mL (500 mLs Intravenous New Bag/Given 12/31/22 1954)        ED Triage Vitals  Encounter Vitals Group     BP 12/31/22 1409 (!) 157/60     Systolic BP Percentile --      Diastolic BP Percentile --      Pulse Rate 12/31/22 1409 84     Resp 12/31/22 1409 18     Temp 12/31/22 1410 98.4 F (36.9 C)     Temp Source 12/31/22 1410 Oral     SpO2 12/31/22 1409 95 %     Weight --      Height --      Head Circumference --      Peak Flow --      Pain Score 12/31/22 1357 7     Pain Loc --      Pain Education --      Exclude from Growth Chart --   ZOXW(96)@     _________________________________________ Significant initial  Findings: Abnormal Labs Reviewed  COMPREHENSIVE METABOLIC PANEL - Abnormal; Notable for the following components:      Result Value   Glucose, Bld 113 (*)    BUN 61 (*)    Creatinine, Ser 3.54 (*)    Calcium 11.3 (*)    AST 14 (*)    Total Bilirubin 0.2 (*)    GFR, Estimated 13 (*)    All other components within normal limits  CBC - Abnormal; Notable for the following components:   WBC 11.1 (*)    Hemoglobin 11.3 (*)    All other components within normal limits  MAGNESIUM - Abnormal; Notable for the following components:   Magnesium 2.5 (*)    All other components within normal limits  URINALYSIS, W/ REFLEX TO CULTURE (INFECTION SUSPECTED) - Abnormal; Notable for the following components:   APPearance CLOUDY (*)    Nitrite POSITIVE (*)    Leukocytes,Ua TRACE (*)    Bacteria, UA RARE (*)    All other components within normal limits  I-STAT CHEM 8, ED - Abnormal; Notable for the following components:  BUN 61 (*)    Creatinine, Ser 3.70 (*)    Glucose, Bld 109 (*)    All other components within normal limits     _________________________ Troponin   Cardiac Panel (last 3 results) Recent Labs    12/31/22 1425 12/31/22 1655  CKTOTAL 50  --   TROPONINIHS  --  9     ECG: Ordered Personally reviewed and interpreted by me showing: HR : 89 Rhythm: Sinus rhythm Prolonged PR interval Left anterior fascicular block Anteroseptal infarct, old Abnormal T, consider ischemia, lateral leads  QTC 446     The recent clinical data is shown below. Vitals:   12/31/22 1913 12/31/22 2000 12/31/22 2015 12/31/22 2030  BP:  (!) 156/116 (!) 157/76 (!) 160/74  Pulse: 90 85 88 84  Resp: 14 15 15 12   Temp:      TempSrc:      SpO2: 96% 96% 96% 97%    WBC     Component Value Date/Time   WBC 11.1 (H) 12/31/2022 1425   LYMPHSABS 2.6 06/04/2020 2204   MONOABS 0.5 06/04/2020 2204   EOSABS 0.2 06/04/2020 2204   BASOSABS 0.0 06/04/2020 2204     Lactic Acid, Venous    Component Value Date/Time   LATICACIDVEN 1.8 12/31/2022 1455     Procalcitonin   Ordered      UA  evidence of UTI     Urine analysis:    Component Value Date/Time   COLORURINE YELLOW 12/31/2022 1712   APPEARANCEUR CLOUDY (A) 12/31/2022 1712   LABSPEC 1.012 12/31/2022 1712   PHURINE 5.0 12/31/2022 1712   GLUCOSEU NEGATIVE 12/31/2022 1712   HGBUR NEGATIVE 12/31/2022 1712   BILIRUBINUR NEGATIVE 12/31/2022 1712   KETONESUR NEGATIVE 12/31/2022 1712   PROTEINUR NEGATIVE 12/31/2022 1712   UROBILINOGEN 0.2 04/05/2014 2138   NITRITE POSITIVE (A) 12/31/2022 1712   LEUKOCYTESUR TRACE (A) 12/31/2022 1712    Results for orders placed or performed during the hospital encounter of 09/12/15  Urine culture     Status: None   Collection Time: 09/12/15 10:20 AM   Specimen: Urine, Random  Result Value Ref Range Status   Specimen Description URINE, RANDOM  Final   Special Requests NONE  Final   Culture   Final    NO GROWTH 1 DAY Performed at [facility]    Report Status 09/14/2015 FINAL  Final    ABX started Antibiotics Given  (last 72 hours)     Date/Time Action Medication Dose Rate   12/31/22 1953 New Bag/Given   cefTRIAXone (ROCEPHIN) 1 g in sodium chloride 0.9 % 100 mL IVPB 1 g 200 mL/hr        ____________________________________________ Recent Labs  Lab 12/31/22 1425 12/31/22 1454  NA 138 137  K 5.0 5.1  CO2 24  --   GLUCOSE 113* 109*  BUN 61* 61*  CREATININE 3.54* 3.70*  CALCIUM 11.3*  --   MG 2.5*  --     Cr    Up from baseline see below Lab Results  Component Value Date   CREATININE 3.70 (H) 12/31/2022   CREATININE 3.54 (H) 12/31/2022   CREATININE 2.01 (H) 06/04/2020    Recent Labs  Lab 12/31/22 1425  AST 14*  ALT 8  ALKPHOS 66  BILITOT 0.2*  PROT 7.7  ALBUMIN 3.9   Lab Results  Component Value Date   CALCIUM 11.3 (H) 12/31/2022    Plt: Lab Results  Component Value Date   PLT 213 12/31/2022  Recent Labs  Lab 12/31/22 1425 12/31/22 1454  WBC 11.1*  --   HGB 11.3* 12.6  HCT 36.1 37.0  MCV 86.8  --   PLT 213  --     HG/HCT   stable,     Component Value Date/Time   HGB 12.6 12/31/2022 1454   HCT 37.0 12/31/2022 1454   MCV 86.8 12/31/2022 1425     No results for input(s): "AMMONIA" in the last 168 hours.   _____________________ Hospitalist was called for admission for UTI anc acute encephalopathy  The following Work up has been ordered so far:  Orders Placed This Encounter  Procedures   Blood culture (routine x 2)   DG Chest Port 1 View   DG Pelvis Portable   CT HEAD WO CONTRAST   CT CERVICAL SPINE WO CONTRAST   CT L-SPINE NO CHARGE   CT T-SPINE NO CHARGE   CT CHEST ABDOMEN PELVIS WO CONTRAST   DG Elbow Complete Left   DG Shoulder Left   MR BRAIN WO CONTRAST   MR Cervical Spine Wo Contrast   Comprehensive metabolic panel   CBC   Ethanol   Protime-INR   Magnesium   CK   Urinalysis, w/ Reflex to Culture (Infection Suspected) -Urine, Clean Catch   Valproic acid level   Diet NPO time specified   ED Cardiac monitoring   Measure blood  pressure   Initiate Carrier Fluid Protocol   In and Out Cath   Soap suds enema   Consult to hospitalist   I-Stat Chem 8, ED   I-Stat Lactic Acid, ED   EKG 12-Lead   EKG 12-Lead     OTHER Significant initial  Findings:  labs showing:     DM  labs:  HbA1C: No results for input(s): "HGBA1C" in the last 8760 hours.     CBG (last 3)  No results for input(s): "GLUCAP" in the last 72 hours.        Cultures:    Component Value Date/Time   SDES URINE, RANDOM 09/12/2015 1020   SPECREQUEST NONE 09/12/2015 1020   CULT  09/12/2015 1020    NO GROWTH 1 DAY Performed at [facility]    REPTSTATUS 09/14/2015 FINAL 09/12/2015 1020     Radiological Exams on Admission: DG Shoulder Left  Result Date: 12/31/2022 CLINICAL DATA:  Status post fall, left shoulder pain EXAM: LEFT SHOULDER - 2+ VIEW COMPARISON:  None Available. FINDINGS: No fracture or dislocation. Generalized osteopenia. Mild degenerative changes of the acromioclavicular joint. Soft tissues are normal. IMPRESSION: 1. No acute osseous injury of the left shoulder. Electronically Signed   By: Elige Ko M.D.   On: 12/31/2022 18:35   CT L-SPINE NO CHARGE  Result Date: 12/31/2022 CLINICAL DATA:  Fall. EXAM: CT THORACIC AND LUMBAR SPINE WITHOUT CONTRAST TECHNIQUE: Multi planar CT imaging of the thoracic and lumbar spine were reconstructed from CT chest, abdomen and pelvis without contrast. RADIATION DOSE REDUCTION: This exam was performed according to the departmental dose-optimization program which includes automated exposure control, adjustment of the mA and/or kV according to patient size and/or use of iterative reconstruction technique. COMPARISON:  None Available. FINDINGS: CT THORACIC SPINE FINDINGS Alignment: Normal. Vertebrae: Normal vertebral body heights. No acute fracture or suspicious bone lesions. Paraspinal and other soft tissues: Unremarkable. Disc levels: Mild degenerative changes of the midthoracic spine. No  spinal canal stenosis or neural foraminal narrowing. CT LUMBAR SPINE FINDINGS Segmentation: Conventional numbering is assumed with 5 non-rib-bearing, lumbar type vertebral bodies.  Alignment: Normal. Vertebrae: Normal vertebral body heights. No acute fracture or suspicious bone lesion. Multilevel degenerative endplate changes. Paraspinal and other soft tissues: Unremarkable. Disc levels: Multilevel lumbar spondylosis, worst at L3-4 and L4-5, where there is at least mild spinal canal stenosis. IMPRESSION: 1. No acute fracture or traumatic listhesis of the thoracic or lumbar spine. 2. Multilevel lumbar spondylosis, worst at L3-4 and L4-5, where there is at least mild spinal canal stenosis. Electronically Signed   By: Orvan Falconer M.D.   On: 12/31/2022 16:19   CT T-SPINE NO CHARGE  Result Date: 12/31/2022 CLINICAL DATA:  Fall. EXAM: CT THORACIC AND LUMBAR SPINE WITHOUT CONTRAST TECHNIQUE: Multi planar CT imaging of the thoracic and lumbar spine were reconstructed from CT chest, abdomen and pelvis without contrast. RADIATION DOSE REDUCTION: This exam was performed according to the departmental dose-optimization program which includes automated exposure control, adjustment of the mA and/or kV according to patient size and/or use of iterative reconstruction technique. COMPARISON:  None Available. FINDINGS: CT THORACIC SPINE FINDINGS Alignment: Normal. Vertebrae: Normal vertebral body heights. No acute fracture or suspicious bone lesions. Paraspinal and other soft tissues: Unremarkable. Disc levels: Mild degenerative changes of the midthoracic spine. No spinal canal stenosis or neural foraminal narrowing. CT LUMBAR SPINE FINDINGS Segmentation: Conventional numbering is assumed with 5 non-rib-bearing, lumbar type vertebral bodies. Alignment: Normal. Vertebrae: Normal vertebral body heights. No acute fracture or suspicious bone lesion. Multilevel degenerative endplate changes. Paraspinal and other soft tissues:  Unremarkable. Disc levels: Multilevel lumbar spondylosis, worst at L3-4 and L4-5, where there is at least mild spinal canal stenosis. IMPRESSION: 1. No acute fracture or traumatic listhesis of the thoracic or lumbar spine. 2. Multilevel lumbar spondylosis, worst at L3-4 and L4-5, where there is at least mild spinal canal stenosis. Electronically Signed   By: Orvan Falconer M.D.   On: 12/31/2022 16:19   CT HEAD WO CONTRAST  Result Date: 12/31/2022 CLINICAL DATA:  Head trauma, moderate-severe; Polytrauma, blunt. Fall. EXAM: CT HEAD WITHOUT CONTRAST CT CERVICAL SPINE WITHOUT CONTRAST CT CHEST, ABDOMEN AND PELVIS WITHOUT CONTRAST TECHNIQUE: Contiguous axial images were obtained from the base of the skull through the vertex without intravenous contrast. Multidetector CT imaging of the cervical spine was performed without intravenous contrast. Multiplanar CT image reconstructions were also generated. Multidetector CT imaging of the chest, abdomen and pelvis was performed following the standard protocol without IV contrast. RADIATION DOSE REDUCTION: This exam was performed according to the departmental dose-optimization program which includes automated exposure control, adjustment of the mA and/or kV according to patient size and/or use of iterative reconstruction technique. COMPARISON:  Head CT 01/25/2019.  CT abdomen/pelvis 09/12/2015. FINDINGS: CT HEAD FINDINGS Brain: No acute hemorrhage. 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 Cortical gray-white differentiation is otherwise preserved. Background of mild chronic small-vessel disease. No hydrocephalus or extra-axial collection. No mass effect or midline shift. Vascular: No hyperdense vessel or unexpected calcification. Skull: No calvarial fracture or suspicious bone lesion. Skull base is unremarkable. Sinuses/Orbits: Mild paranasal sinus disease. Orbits are unremarkable. Other: None. CT CERVICAL FINDINGS Alignment: Normal. Skull base and vertebrae: No acute  fracture. Normal craniocervical junction. No suspicious bone lesions. Soft tissues and spinal canal: No prevertebral fluid or swelling. No visible canal hematoma. Disc levels: Mild cervical spondylosis without high-grade spinal canal stenosis. Other: Atherosclerotic calcifications of the carotid bulbs. CT CHEST FINDINGS Cardiovascular: No acute vascular findings. Normal heart size. No pericardial effusion. Severe coronary artery calcifications. Atherosclerotic calcifications of the thoracic aorta. Mediastinum/Nodes: No enlarged mediastinal, hilar, or  axillary lymph nodes. Thyroid gland, trachea, and esophagus demonstrate no significant findings. Lungs/Pleura: Subsegmental atelectasis in the lung bases. No consolidation, pulmonary edema or traumatic findings. No pleural effusion or pneumothorax. Musculoskeletal: No chest wall mass or suspicious bone lesions identified. CT ABDOMEN AND PELVIS FINDINGS Hepatobiliary: No hepatic injury or perihepatic hematoma. Gallbladder is unremarkable. Pancreas: Unremarkable. No pancreatic ductal dilatation or surrounding inflammatory changes. Spleen: No splenic injury or perisplenic hematoma. Adrenals/Urinary Tract: No adrenal hemorrhage or renal injury identified. Bladder is unremarkable. Stomach/Bowel: Stomach is within normal limits. Appendix appears normal. No evidence of bowel wall thickening, distention, or inflammatory changes. Vascular/Lymphatic: Aortic atherosclerosis. No enlarged abdominal or pelvic lymph nodes. Reproductive: Calcified uterine fibroids.  No adnexal masses. Other: No abdominal wall hernia or abnormality. No abdominopelvic ascites. Musculoskeletal: No fracture is seen. IMPRESSION: 1. No acute intracranial abnormality. 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 2. No acute fracture or traumatic listhesis of the cervical spine. 3. No acute traumatic findings in the chest, abdomen, or pelvis. 4. Coronary artery disease. Aortic Atherosclerosis (ICD10-I70.0).  Electronically Signed   By: Orvan Falconer M.D.   On: 12/31/2022 16:14   CT CERVICAL SPINE WO CONTRAST  Result Date: 12/31/2022 CLINICAL DATA:  Head trauma, moderate-severe; Polytrauma, blunt. Fall. EXAM: CT HEAD WITHOUT CONTRAST CT CERVICAL SPINE WITHOUT CONTRAST CT CHEST, ABDOMEN AND PELVIS WITHOUT CONTRAST TECHNIQUE: Contiguous axial images were obtained from the base of the skull through the vertex without intravenous contrast. Multidetector CT imaging of the cervical spine was performed without intravenous contrast. Multiplanar CT image reconstructions were also generated. Multidetector CT imaging of the chest, abdomen and pelvis was performed following the standard protocol without IV contrast. RADIATION DOSE REDUCTION: This exam was performed according to the departmental dose-optimization program which includes automated exposure control, adjustment of the mA and/or kV according to patient size and/or use of iterative reconstruction technique. COMPARISON:  Head CT 01/25/2019.  CT abdomen/pelvis 09/12/2015. FINDINGS: CT HEAD FINDINGS Brain: No acute hemorrhage. 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 Cortical gray-white differentiation is otherwise preserved. Background of mild chronic small-vessel disease. No hydrocephalus or extra-axial collection. No mass effect or midline shift. Vascular: No hyperdense vessel or unexpected calcification. Skull: No calvarial fracture or suspicious bone lesion. Skull base is unremarkable. Sinuses/Orbits: Mild paranasal sinus disease. Orbits are unremarkable. Other: None. CT CERVICAL FINDINGS Alignment: Normal. Skull base and vertebrae: No acute fracture. Normal craniocervical junction. No suspicious bone lesions. Soft tissues and spinal canal: No prevertebral fluid or swelling. No visible canal hematoma. Disc levels: Mild cervical spondylosis without high-grade spinal canal stenosis. Other: Atherosclerotic calcifications of the carotid bulbs. CT CHEST FINDINGS  Cardiovascular: No acute vascular findings. Normal heart size. No pericardial effusion. Severe coronary artery calcifications. Atherosclerotic calcifications of the thoracic aorta. Mediastinum/Nodes: No enlarged mediastinal, hilar, or axillary lymph nodes. Thyroid gland, trachea, and esophagus demonstrate no significant findings. Lungs/Pleura: Subsegmental atelectasis in the lung bases. No consolidation, pulmonary edema or traumatic findings. No pleural effusion or pneumothorax. Musculoskeletal: No chest wall mass or suspicious bone lesions identified. CT ABDOMEN AND PELVIS FINDINGS Hepatobiliary: No hepatic injury or perihepatic hematoma. Gallbladder is unremarkable. Pancreas: Unremarkable. No pancreatic ductal dilatation or surrounding inflammatory changes. Spleen: No splenic injury or perisplenic hematoma. Adrenals/Urinary Tract: No adrenal hemorrhage or renal injury identified. Bladder is unremarkable. Stomach/Bowel: Stomach is within normal limits. Appendix appears normal. No evidence of bowel wall thickening, distention, or inflammatory changes. Vascular/Lymphatic: Aortic atherosclerosis. No enlarged abdominal or pelvic lymph nodes. Reproductive: Calcified uterine fibroids.  No adnexal masses. Other:  No abdominal wall hernia or abnormality. No abdominopelvic ascites. Musculoskeletal: No fracture is seen. IMPRESSION: 1. No acute intracranial abnormality. 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 2. No acute fracture or traumatic listhesis of the cervical spine. 3. No acute traumatic findings in the chest, abdomen, or pelvis. 4. Coronary artery disease. Aortic Atherosclerosis (ICD10-I70.0). Electronically Signed   By: Orvan Falconer M.D.   On: 12/31/2022 16:14   CT CHEST ABDOMEN PELVIS WO CONTRAST  Result Date: 12/31/2022 CLINICAL DATA:  Head trauma, moderate-severe; Polytrauma, blunt. Fall. EXAM: CT HEAD WITHOUT CONTRAST CT CERVICAL SPINE WITHOUT CONTRAST CT CHEST, ABDOMEN AND PELVIS WITHOUT CONTRAST  TECHNIQUE: Contiguous axial images were obtained from the base of the skull through the vertex without intravenous contrast. Multidetector CT imaging of the cervical spine was performed without intravenous contrast. Multiplanar CT image reconstructions were also generated. Multidetector CT imaging of the chest, abdomen and pelvis was performed following the standard protocol without IV contrast. RADIATION DOSE REDUCTION: This exam was performed according to the departmental dose-optimization program which includes automated exposure control, adjustment of the mA and/or kV according to patient size and/or use of iterative reconstruction technique. COMPARISON:  Head CT 01/25/2019.  CT abdomen/pelvis 09/12/2015. FINDINGS: CT HEAD FINDINGS Brain: No acute hemorrhage. 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 Cortical gray-white differentiation is otherwise preserved. Background of mild chronic small-vessel disease. No hydrocephalus or extra-axial collection. No mass effect or midline shift. Vascular: No hyperdense vessel or unexpected calcification. Skull: No calvarial fracture or suspicious bone lesion. Skull base is unremarkable. Sinuses/Orbits: Mild paranasal sinus disease. Orbits are unremarkable. Other: None. CT CERVICAL FINDINGS Alignment: Normal. Skull base and vertebrae: No acute fracture. Normal craniocervical junction. No suspicious bone lesions. Soft tissues and spinal canal: No prevertebral fluid or swelling. No visible canal hematoma. Disc levels: Mild cervical spondylosis without high-grade spinal canal stenosis. Other: Atherosclerotic calcifications of the carotid bulbs. CT CHEST FINDINGS Cardiovascular: No acute vascular findings. Normal heart size. No pericardial effusion. Severe coronary artery calcifications. Atherosclerotic calcifications of the thoracic aorta. Mediastinum/Nodes: No enlarged mediastinal, hilar, or axillary lymph nodes. Thyroid gland, trachea, and esophagus demonstrate no significant  findings. Lungs/Pleura: Subsegmental atelectasis in the lung bases. No consolidation, pulmonary edema or traumatic findings. No pleural effusion or pneumothorax. Musculoskeletal: No chest wall mass or suspicious bone lesions identified. CT ABDOMEN AND PELVIS FINDINGS Hepatobiliary: No hepatic injury or perihepatic hematoma. Gallbladder is unremarkable. Pancreas: Unremarkable. No pancreatic ductal dilatation or surrounding inflammatory changes. Spleen: No splenic injury or perisplenic hematoma. Adrenals/Urinary Tract: No adrenal hemorrhage or renal injury identified. Bladder is unremarkable. Stomach/Bowel: Stomach is within normal limits. Appendix appears normal. No evidence of bowel wall thickening, distention, or inflammatory changes. Vascular/Lymphatic: Aortic atherosclerosis. No enlarged abdominal or pelvic lymph nodes. Reproductive: Calcified uterine fibroids.  No adnexal masses. Other: No abdominal wall hernia or abnormality. No abdominopelvic ascites. Musculoskeletal: No fracture is seen. IMPRESSION: 1. No acute intracranial abnormality. 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 2. No acute fracture or traumatic listhesis of the cervical spine. 3. No acute traumatic findings in the chest, abdomen, or pelvis. 4. Coronary artery disease. Aortic Atherosclerosis (ICD10-I70.0). Electronically Signed   By: Orvan Falconer M.D.   On: 12/31/2022 16:14   DG Elbow Complete Left  Result Date: 12/31/2022 CLINICAL DATA:  Status post fall, left elbow pain EXAM: LEFT ELBOW - COMPLETE 3+ VIEW COMPARISON:  None Available. FINDINGS: There is no evidence of fracture, dislocation, or joint effusion. There is no evidence of arthropathy or other focal bone abnormality.  Soft tissues are unremarkable. IMPRESSION: Negative. Electronically Signed   By: Elige Ko M.D.   On: 12/31/2022 15:05   DG Pelvis Portable  Result Date: 12/31/2022 CLINICAL DATA:  Status post fall, trauma EXAM: PORTABLE PELVIS 1-2 VIEWS COMPARISON:  None  Available. FINDINGS: No acute fracture or dislocation. No aggressive osseous lesion. Normal alignment. Generalized osteopenia. Lower lumbar spine spondylosis. Calcified right uterine fibroid. Peripheral vascular atherosclerotic disease. No radiopaque foreign body or soft tissue emphysema. IMPRESSION: 1. No acute osseous injury of the pelvis. If there is further clinical concern, recommend a CT of the pelvis. Electronically Signed   By: Elige Ko M.D.   On: 12/31/2022 15:04   DG Chest Port 1 View  Result Date: 12/31/2022 CLINICAL DATA:  Status post fall, trauma EXAM: PORTABLE CHEST 1 VIEW COMPARISON:  06/04/2020 FINDINGS: Mild lingular scarring versus atelectasis. No focal consolidation. No pleural effusion or pneumothorax. Heart and mediastinal contours are unremarkable. No acute osseous abnormality. IMPRESSION: No active disease. Electronically Signed   By: Elige Ko M.D.   On: 12/31/2022 15:02   _______________________________________________________________________________________________________ Latest  Blood pressure (!) 160/74, pulse 84, temperature 98.4 F (36.9 C), temperature source Oral, resp. rate 12, SpO2 97%.   Vitals  labs and radiology finding personally reviewed  Review of Systems:    Pertinent positives include:   fatigue fall confusion  Constitutional:  No weight loss, night sweats, Fevers, chills,, weight loss  HEENT:  No headaches, Difficulty swallowing,Tooth/dental problems,Sore throat,  No sneezing, itching, ear ache, nasal congestion, post nasal drip,  Cardio-vascular:  No chest pain, Orthopnea, PND, anasarca, dizziness, palpitations.no Bilateral lower extremity swelling  GI:  No heartburn, indigestion, abdominal pain, nausea, vomiting, diarrhea, change in bowel habits, loss of appetite, melena, blood in stool, hematemesis Resp:  no shortness of breath at rest. No dyspnea on exertion, No excess mucus, no productive cough, No non-productive cough, No coughing up of  blood.No change in color of mucus.No wheezing. Skin:  no rash or lesions. No jaundice GU:  no dysuria, change in color of urine, no urgency or frequency. No straining to urinate.  No flank pain.  Musculoskeletal:  No joint pain or no joint swelling. No decreased range of motion. No back pain.  Psych:  No change in mood or affect. No depression or anxiety. No memory loss.  Neuro: no localizing neurological complaints, no tingling, no weakness, no double vision, no gait abnormality, no slurred speech, no  All systems reviewed and apart from HOPI all are negative _______________________________________________________________________________________________ Past Medical History:   Past Medical History:  Diagnosis Date   Anemia    Bipolar 1 disorder (HCC)    CHF (congestive heart failure) (HCC)    Depression    Diabetes mellitus without complication (HCC)    Hypertension    Neuropathy    Stroke [facility])       Past Surgical History:  Procedure Laterality Date   CESAREAN SECTION      Social History:  Ambulatory  wheelchair bound, bed bound     reports that she has never smoked. She does not have any smokeless tobacco history on file. She reports that she does not drink alcohol. No history on file for drug use.    Family History: None contributory ______________________________________________________________________________________________ Allergies: Allergies  Allergen Reactions   Peanut-Containing Drug Products Anaphylaxis and Swelling   Alprazolam Other (See Comments)    weakness   Cauliflower [Brassica Oleracea]      Prior to Admission medications   Medication Sig Start  Date End Date Taking? Authorizing Provider  acetaminophen (TYLENOL) 500 MG tablet Take by mouth. 11/01/19   [provider]  albuterol (PROVENTIL HFA;VENTOLIN HFA) 108 (90 BASE) MCG/ACT inhaler Inhale 2 puffs into the lungs every 6 (six) hours as needed for wheezing. 09/02/13   Dorothea Ogle, MD  ALPRAZolam Prudy Feeler) 1 MG tablet Take 1 mg by mouth 3 (three) times daily.    [provider]  amLODipine (NORVASC) 10 MG tablet Take 10 mg by mouth daily. 10/22/21   [provider]  ANUSOL-HC 2.5 % rectal cream Apply 1 Application topically 2 (two) times daily as needed. 09/23/21   [provider]  aspirin EC 81 MG tablet Take 81 mg by mouth daily. 10/24/21   [provider]  atorvastatin (LIPITOR) 20 MG tablet Take 1 tablet by mouth at bedtime. 11/01/19   [provider]  budesonide-formoterol (SYMBICORT) 160-4.5 MCG/ACT inhaler Inhale 2 puffs into the lungs 2 (two) times daily.    [provider]  busPIRone (BUSPAR) 15 MG tablet Take by mouth.    [provider]  capsaicin (ZOSTRIX) 0.025 % cream SMARTSIG:sparingly Topical Every Night 10/24/21   [provider]  carvedilol (COREG) 12.5 MG tablet Take 12.5 mg by mouth 2 (two) times daily. 10/24/21   [provider]  cetirizine (ZYRTEC) 10 MG tablet Take by mouth.    [provider]  cinacalcet (SENSIPAR) 90 MG tablet Take 90 mg by mouth daily. 10/15/21   [provider]  cloNIDine (CATAPRES - DOSED IN MG/24 HR) 0.3 mg/24hr patch Place 1 patch onto the skin once a week.    [provider]  D-5000 125 MCG (5000 UT) TABS Take 1 tablet by mouth daily. 10/24/21   [provider]  DEPAKOTE 125 MG DR tablet Take by mouth 2 (two) times daily. 09/23/21   [provider]  dextrose (GLUTOSE) 40 % GEL Take by mouth.    [provider]  diphenhydrAMINE (BENADRYL) 25 MG tablet Take by mouth. 10/06/18   [provider]  DULCOLAX 10 MG suppository Place 10 mg rectally daily as needed. 10/24/21   [provider]  EPINEPHrine 0.3 mg/0.3 mL IJ SOAJ injection Inject into the skin. 08/04/17   [provider]  famotidine (PEPCID) 20 MG tablet Take by mouth. 10/06/18   [provider]  fluticasone (FLONASE)  50 MCG/ACT nasal spray 1 spray by Both Nostrils route 2 (two) times a day as needed for Rhinitis.    [provider]  haloperidol (HALDOL) 5 MG tablet Take by mouth. 11/01/19   [provider]  hydrALAZINE (APRESOLINE) 100 MG tablet Take 100 mg by mouth 3 (three) times daily. 10/12/21   [provider]  hydrocortisone cream 1 % Apply 1 application topically 2 (two) times daily as needed for itching. For hemorrhoids    [provider]  insulin detemir (LEVEMIR) 100 UNIT/ML injection Inject 32 units into the skin every morning and 23 units every evening    [provider]  insulin glargine (LANTUS) 100 unit/mL SOPN Inject 10 U in the AM and 80 U in the PM Patient taking differently: Inject 47-65 Units into the skin 2 (two) times daily. Inject 47 unitsin the AM and 65 units in the PM 09/02/13   Dorothea Ogle, MD  latanoprost (XALATAN) 0.005 % ophthalmic solution Place 1 drop into both eyes nightly. 11/01/19   [provider]  levocetirizine (XYZAL) 5 MG tablet Take 5 mg by mouth  daily. 10/31/21   [provider]  Liraglutide (VICTOZA) 18 MG/3ML SOPN Inject 1.8 mg into the skin daily at 12 noon. 09/02/13   Dorothea Ogle, MD  magnesium oxide (MAG-OX) 400 MG tablet Take 1 tablet by mouth 2 (two) times daily. 10/24/21   [provider]  Melatonin 10 MG TABS Take by mouth. 07/14/16   [provider]  methocarbamol (ROBAXIN) 500 MG tablet Take 500 mg by mouth 3 (three) times daily. 10/28/21   [provider]  metoprolol (TOPROL-XL) 200 MG 24 hr tablet Take 200 mg by mouth daily.    [provider]  Misc. Devices MISC New CPAP mask and supplies per patient preference for OSA. 10/21/18   [provider]  MYRBETRIQ 50 MG TB24 tablet Take 50 mg by mouth daily. 10/11/21   [provider]  omeprazole (PRILOSEC) 40 MG capsule Take by mouth. 11/01/19   [provider]  ondansetron (ZOFRAN) 4 MG tablet  Take by mouth. 07/13/21   [provider]  Polyethyl Glycol-Propyl Glycol (SYSTANE OP) Apply to eye.    [provider]  polyethylene glycol (MIRALAX / GLYCOLAX) 17 g packet Take by mouth. 11/02/19   [provider]  pregabalin (LYRICA) 50 MG capsule Take by mouth.    [provider]  PROAIR DIGIHALER 108 [id number] Base) MCG/ACT AEPB Inhale into the lungs. 06/03/21   [provider]  Propylene Glycol (SYSTANE BALANCE) 0.6 % SOLN Place 1 drop into both eyes 3 times daily. 11/01/19   [provider]  risperiDONE (RISPERDAL) 1 MG tablet Take 1.5 mg by mouth 2 (two) times daily.    [provider]  ROBITUSSIN COUGH+CHEST CONG DM 20-400 MG/20ML LIQD Take 10 mLs by mouth every 6 (six) hours as needed. 10/24/21   [provider]  senna (SENOKOT) 8.6 MG tablet Take 1 tablet by mouth 2 (two) times daily.    [provider]  tamsulosin (FLOMAX) 0.4 MG CAPS capsule Take 1 capsule by mouth daily. 11/02/19   [provider]  tiZANidine (ZANAFLEX) 2 MG tablet Take 2 mg by mouth 2 (two) times daily.  09/08/15   [provider]  traZODone (DESYREL) 50 MG tablet Take by mouth. 06/21/18   [provider]  trimethoprim (TRIMPEX) 100 MG tablet Take 100 mg by mouth daily. 11/01/21   [provider]  valsartan (DIOVAN) 160 MG tablet Take 160 mg by mouth daily. 05/12/21   [provider]  venlafaxine (EFFEXOR) 75 MG tablet Take 75-150 mg by mouth 2 (two) times daily with a meal. Take 1 in the morning Take 2 at bedtime    [provider]  VOLTAREN ARTHRITIS PAIN 1 % GEL SMARTSIG:4 Gram(s) Topical Twice Daily 10/24/21   [provider]    ___________________________________________________________________________________________________ Physical Exam:    12/31/2022    8:30 PM 12/31/2022    8:15 PM 12/31/2022    8:00 PM  Vitals with BMI  Systolic 160 157 440  Diastolic 74 76 116  Pulse 84 88  85     1. General:  in No  Acute distress    Chronically ill  -appearing 2. Psychological: Alert and   Oriented 3. Head/ENT Dry Mucous Membranes                          Head Non traumatic, neck supple  Poor Dentition 4. SKIN:  decreased Skin turgor,  Skin clean Dry and intact no rash    5. Heart: Regular rate and rhythm no  Murmur, no Rub or gallop 6. Lungs: no wheezes or crackles   7. Abdomen: Soft,  non-tender, Non distended   obese  bowel sounds present 8. Lower extremities: no clubbing, cyanosis, no  edema 9. Neurologically Grossly intact, moving all 4 extremities equally  10. MSK: Normal range of motion    Chart has been reviewed  ______________________________________________________________________________________________  Assessment/Plan   77 y.o. female with medical history significant of bipolar, CKD stage 4, CHF, HTN, DM2, CVA, hypercalcemia, obesity , GERD, neuropathy, Mobitz type I heart block   Admitted for  ESBL UTI acute encephalopathy   Present on Admission:  Acute encephalopathy  Bipolar 1 disorder, depressed (HCC)  Acute renal failure superimposed on stage 3 chronic kidney disease (HCC)  AV block, Mobitz 1  Hypertension associated with stage 3 chronic kidney disease due to type 2 diabetes mellitus (HCC)  Sleep apnea, unspecified  Type 2 diabetes mellitus with diabetic chronic kidney disease (HCC)  UTI (urinary tract infection)  Hyperlipidemia  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)     Bipolar 1 disorder, depressed (HCC) Cont Depakote sprinkles Check depakote level  Acute encephalopathy   - most likely multifactorial secondary to combination of  infection  dehydration secondary to decreased by mouth intake,  polypharmacy   - Will rehydrate   - treat underlining infection   - Hold contributing medications   -   MRI of the brain non acute  - neurological exam appears to be nonfocal but  patient unable to cooperate fully   - VBG  and ammonia pending     Acute renal failure superimposed on stage 3 chronic kidney disease (HCC) Obtain urine electrolytes and rehydrate  AV block, Mobitz 1 Chronic stable  Hypertension associated with stage 3 chronic kidney disease due to type 2 diabetes mellitus (HCC) Continue home meds Norvasc 10 mg po q day hydralazine 100 mg po tid Coreg 12.5 mg po bid  Sleep apnea, unspecified Order cpap  Type 2 diabetes mellitus with diabetic chronic kidney disease (HCC) Order SSI Does not sem to be on long lasting insulin anymore  UTI (urinary tract infection) ESBL it seems pt has been prescribed Ertapenem IM Obtain Urine culture for now cover with meropenem IV and await results May need adjustment of antibiotic coverage   Hyperlipidemia Continue lipitor 20 mg po q day  CHF (congestive heart failure) (HCC) No ECho in our system will obtain  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) Hold ARB given worsening renal function and uptrending potassium    Other plan as per orders.  DVT prophylaxis:  SCD     Code Status:    Code Status: Prior FULL CODE  as per patient   I had personally discussed CODE STATUS with patient   ACP   none    Family Communication:   Family not at  Bedside    Diet  Diet Orders (From admission, onward)     Start     Ordered   12/31/22 1421  Diet NPO time specified  Diet effective now        12/31/22 1422            Disposition Plan:  Back to current facility when stable     Following barriers for discharge:                                                         Electrolytes corrected                              white count improving able to transition to PO antibiotics                                    Consult Orders  (From admission, onward)           Start     Ordered   12/31/22 2103  Consult to hospitalist  Paged By Mertie Moores, [place] #[phone]  Gloris Manchester, MD  [facility] 06  [phone]  Once       Provider:  (Not yet assigned)  Question Answer Comment  Place call to: [facility]   Reason for Consult Admit      12/31/22 2102                               Would benefit from PT/OT eval prior to DC  Ordered                                       Diabetes care coordinator                   Transition of care consulted                   Nutrition    consulted              Consults called: none   Admission status:  ED Disposition     ED Disposition  Admit   Condition  --   Comment  Hospital Area: [facility] [[id number]]  Level of Care: Telemetry Medical [[id number]]  May admit patient to [facility] or [facility] if equivalent level of care is available:: No  Covid Evaluation: Asymptomatic - no recent exposure (last 10 days) testing not required  Diagnosis: Acute encephalopathy [829562]  Admitting Physician: Therisa Doyne [[id number]]  Attending Physician: Therisa Doyne [[id number]]  Certification:: I certify this patient will need inpatient services for at least 2 midnights  Expected Medical Readiness: 01/02/2023            inpatient     I Expect 2 midnight stay secondary to severity of patient's current illness need for inpatient interventions justified by the following:    Severe lab/radiological/exam abnormalities including:   ESBL UTI and extensive comorbidities including:    DM2    CHF   COPD/asthma   CKD  history of stroke with residual deficits    That are currently affecting medical management.   I expect  patient to be hospitalized for 2 midnights requiring inpatient medical care.  Patient is at high risk for adverse outcome (such as loss of life or disability) if not treated.  Indication for inpatient stay as follows:  Severe change from baseline regarding mental status   Need for IV antibiotics, IV fluids,      Level of care     tele  For  12H    Michaeal Davis 12/31/2022, 11:33 PM    [facility]     after 2 AM please page floor coverage PA If 7AM-7PM, please contact the day team taking care of the patient using [email or web address]

## 2022-12-31 NOTE — ED Provider Notes (Signed)
[place] EMERGENCY DEPARTMENT AT [facility] Provider Note   CSN: [id number] Arrival date & time: 12/31/22  1352     History  Chief Complaint  Patient presents with   Samantha [name] is a 77 y.o. female.   Fall Associated symptoms include headaches.  Patient presents for fall.  Medical history includes bipolar disorder, CKD, CHF, HTN, T2DM.  She resides in a nursing facility.  Earlier today, she was reportedly in her normal state of health.  She was found on the floor next to her bed.  No LOC was witnessed.  Patient describes her fall as shifting in the bed and rolling off the edge.  She stated that she landed directly on her head and endorse some headache pain and neck pain.  EMS was called.  Currently, patient endorses neck pain.  Nursing facility states that she had a previous left shoulder injury.  They are not sure of the type of injury.  She is reportedly on antibiotic for UTI.  Per review of her facility paperwork, she takes 50 mg tablet of trimethoprim daily.  Per further chart review, she has had worsening kidney function.  She was seen by her nephrologist 3 weeks ago.     Home Medications Prior to Admission medications   Medication Sig Start Date End Date Taking? Authorizing Provider  acetaminophen (TYLENOL) 500 MG tablet Take by mouth. 11/01/19   [provider]  albuterol (PROVENTIL HFA;VENTOLIN HFA) 108 (90 BASE) MCG/ACT inhaler Inhale 2 puffs into the lungs every 6 (six) hours as needed for wheezing. 09/02/13   Dorothea Ogle, MD  ALPRAZolam Prudy Feeler) 1 MG tablet Take 1 mg by mouth 3 (three) times daily.    [provider]  amLODipine (NORVASC) 10 MG tablet Take 10 mg by mouth daily. 10/22/21   [provider]  ANUSOL-HC 2.5 % rectal cream Apply 1 Application topically 2 (two) times daily as needed. 09/23/21   [provider]  aspirin EC 81 MG tablet Take 81 mg by mouth daily. 10/24/21   [provider]   atorvastatin (LIPITOR) 20 MG tablet Take 1 tablet by mouth at bedtime. 11/01/19   [provider]  budesonide-formoterol (SYMBICORT) 160-4.5 MCG/ACT inhaler Inhale 2 puffs into the lungs 2 (two) times daily.    [provider]  busPIRone (BUSPAR) 15 MG tablet Take by mouth.    [provider]  capsaicin (ZOSTRIX) 0.025 % cream SMARTSIG:sparingly Topical Every Night 10/24/21   [provider]  carvedilol (COREG) 12.5 MG tablet Take 12.5 mg by mouth 2 (two) times daily. 10/24/21   [provider]  cetirizine (ZYRTEC) 10 MG tablet Take by mouth.    [provider]  cinacalcet (SENSIPAR) 90 MG tablet Take 90 mg by mouth daily. 10/15/21   [provider]  cloNIDine (CATAPRES - DOSED IN MG/24 HR) 0.3 mg/24hr patch Place 1 patch onto the skin once a week.    [provider]  D-5000 125 MCG (5000 UT) TABS Take 1 tablet by mouth daily. 10/24/21   [provider]  DEPAKOTE 125 MG DR tablet Take by mouth 2 (two) times daily. 09/23/21   [provider]  dextrose (GLUTOSE) 40 % GEL Take by mouth.    [provider]  diphenhydrAMINE (BENADRYL) 25 MG tablet Take by mouth. 10/06/18   [provider]  DULCOLAX 10 MG suppository Place 10 mg rectally daily as needed. 10/24/21   [provider]  EPINEPHrine 0.3  mg/0.3 mL IJ SOAJ injection Inject into the skin. 08/04/17   [provider]  famotidine (PEPCID) 20 MG tablet Take by mouth. 10/06/18   [provider]  fluticasone (FLONASE) 50 MCG/ACT nasal spray 1 spray by Both Nostrils route 2 (two) times a day as needed for Rhinitis.    [provider]  haloperidol (HALDOL) 5 MG tablet Take by mouth. 11/01/19   [provider]  hydrALAZINE (APRESOLINE) 100 MG tablet Take 100 mg by mouth 3 (three) times daily. 10/12/21   [provider]  hydrocortisone cream 1 % Apply 1 application topically 2 (two) times daily as needed for  itching. For hemorrhoids    [provider]  insulin detemir (LEVEMIR) 100 UNIT/ML injection Inject 32 units into the skin every morning and 23 units every evening    [provider]  insulin glargine (LANTUS) 100 unit/mL SOPN Inject 10 U in the AM and 80 U in the PM Patient taking differently: Inject 47-65 Units into the skin 2 (two) times daily. Inject 47 unitsin the AM and 65 units in the PM 09/02/13   Dorothea Ogle, MD  latanoprost (XALATAN) 0.005 % ophthalmic solution Place 1 drop into both eyes nightly. 11/01/19   [provider]  levocetirizine (XYZAL) 5 MG tablet Take 5 mg by mouth daily. 10/31/21   [provider]  Liraglutide (VICTOZA) 18 MG/3ML SOPN Inject 1.8 mg into the skin daily at 12 noon. 09/02/13   Dorothea Ogle, MD  magnesium oxide (MAG-OX) 400 MG tablet Take 1 tablet by mouth 2 (two) times daily. 10/24/21   [provider]  Melatonin 10 MG TABS Take by mouth. 07/14/16   [provider]  methocarbamol (ROBAXIN) 500 MG tablet Take 500 mg by mouth 3 (three) times daily. 10/28/21   [provider]  metoprolol (TOPROL-XL) 200 MG 24 hr tablet Take 200 mg by mouth daily.    [provider]  Misc. Devices MISC New CPAP mask and supplies per patient preference for OSA. 10/21/18   [provider]  MYRBETRIQ 50 MG TB24 tablet Take 50 mg by mouth daily. 10/11/21   [provider]  omeprazole (PRILOSEC) 40 MG capsule Take by mouth. 11/01/19   [provider]  ondansetron (ZOFRAN) 4 MG tablet Take by mouth. 07/13/21   [provider]  Polyethyl Glycol-Propyl Glycol (SYSTANE OP) Apply to eye.    [provider]  polyethylene glycol (MIRALAX / GLYCOLAX) 17 g packet Take by mouth. 11/02/19   [provider]  pregabalin (LYRICA) 50 MG capsule Take by mouth.    [provider]  PROAIR DIGIHALER 108 [phone] Base) MCG/ACT AEPB Inhale into the lungs. 06/03/21   [provider]   Propylene Glycol (SYSTANE BALANCE) 0.6 % SOLN Place 1 drop into both eyes 3 times daily. 11/01/19   [provider]  risperiDONE (RISPERDAL) 1 MG tablet Take 1.5 mg by mouth 2 (two) times daily.    [provider]  ROBITUSSIN COUGH+CHEST CONG DM 20-400 MG/20ML LIQD Take 10 mLs by mouth every 6 (six) hours as needed. 10/24/21   [provider]  senna (SENOKOT) 8.6 MG tablet Take 1 tablet by mouth 2 (two) times daily.    [provider]  tamsulosin (FLOMAX) 0.4 MG CAPS capsule Take 1 capsule by mouth daily. 11/02/19   [provider]  tiZANidine (ZANAFLEX) 2 MG tablet Take 2 mg by mouth 2 (two) times daily.  09/08/15   [provider]  traZODone (DESYREL) 50 MG tablet Take by mouth. 06/21/18   [provider]  trimethoprim (TRIMPEX) 100 MG tablet Take 100 mg by mouth daily. 11/01/21   [provider]  valsartan (DIOVAN) 160 MG tablet Take 160 mg by mouth daily. 05/12/21   [provider]  venlafaxine (EFFEXOR) 75 MG tablet Take 75-150 mg by mouth 2 (two) times daily with a meal. Take 1 in the morning Take 2 at bedtime    [provider]  VOLTAREN ARTHRITIS PAIN 1 % GEL SMARTSIG:4 Gram(s) Topical Twice Daily 10/24/21   [provider]      Allergies    Peanut-containing drug products, Alprazolam, and Cauliflower [brassica oleracea]    Review of Systems   Review of Systems  Musculoskeletal:  Positive for arthralgias and neck pain.  Neurological:  Positive for headaches.  All other systems reviewed and are negative.   Physical Exam Updated Vital Signs BP (!) 160/74   Pulse 84   Temp 98.4 F (36.9 C) (Oral)   Resp 12   SpO2 97%  Physical Exam Vitals and nursing note reviewed.  Constitutional:      General: She is not in acute distress.    Appearance: Normal appearance. She is well-developed. She is not ill-appearing, toxic-appearing or diaphoretic.  HENT:     Head: Normocephalic and  atraumatic.     Right Ear: External ear normal.     Left Ear: External ear normal.     Nose: Nose normal.     Mouth/Throat:     Mouth: Mucous membranes are moist.  Eyes:     Extraocular Movements: Extraocular movements intact.     Conjunctiva/sclera: Conjunctivae normal.  Cardiovascular:     Rate and Rhythm: Normal rate and regular rhythm.  Pulmonary:     Effort: Pulmonary effort is normal. No respiratory distress.     Breath sounds: Normal breath sounds. No wheezing or rales.  Chest:     Chest wall: No tenderness.  Abdominal:     General: There is no distension.     Palpations: Abdomen is soft.     Tenderness: There is no abdominal tenderness.  Musculoskeletal:        General: No swelling.     Cervical back: Neck supple. Tenderness present.  Skin:    General: Skin is warm and dry.     Coloration: Skin is not jaundiced or pale.  Neurological:     General: No focal deficit present.     Mental Status: She is alert. She is disoriented.  Psychiatric:        Mood and Affect: Mood normal.        Behavior: Behavior normal.     ED Results / Procedures / Treatments   Labs (all labs ordered are listed, but only abnormal results are displayed) Labs Reviewed  COMPREHENSIVE METABOLIC PANEL - Abnormal; Notable for the following components:      Result Value   Glucose, Bld 113 (*)    BUN 61 (*)    Creatinine, Ser 3.54 (*)    Calcium 11.3 (*)    AST 14 (*)    Total Bilirubin 0.2 (*)    GFR, Estimated 13 (*)    All other components within normal limits  CBC - Abnormal; Notable for the following components:   WBC 11.1 (*)    Hemoglobin 11.3 (*)    All other components within normal limits  MAGNESIUM - Abnormal; Notable for the following components:   Magnesium 2.5 (*)  All other components within normal limits  URINALYSIS, W/ REFLEX TO CULTURE (INFECTION SUSPECTED) - Abnormal; Notable for the following components:   APPearance CLOUDY (*)    Nitrite POSITIVE (*)     Leukocytes,Ua TRACE (*)    Bacteria, UA RARE (*)    All other components within normal limits  I-STAT CHEM 8, ED - Abnormal; Notable for the following components:   BUN 61 (*)    Creatinine, Ser 3.70 (*)    Glucose, Bld 109 (*)    All other components within normal limits  CULTURE, BLOOD (ROUTINE X 2)  CULTURE, BLOOD (ROUTINE X 2)  SARS CORONAVIRUS 2 BY RT PCR  URINE CULTURE  PROTIME-INR  CK  ETHANOL  VALPROIC ACID LEVEL  PROCALCITONIN  PREALBUMIN  AMMONIA  BLOOD GAS, VENOUS  PHOSPHORUS  HEMOGLOBIN A1C  I-STAT CG4 LACTIC ACID, ED  TROPONIN I (HIGH SENSITIVITY)  TROPONIN I (HIGH SENSITIVITY)    EKG EKG Interpretation Date/Time:  Sunday December 31 2022 15:55:26 EDT Ventricular Rate:  89 PR Interval:  284 QRS Duration:  87 QT Interval:  366 QTC Calculation: 446 R Axis:   -72  Text Interpretation: Sinus rhythm Prolonged PR interval Left anterior fascicular block Anteroseptal infarct, old Abnormal T, consider ischemia, lateral leads Confirmed by Gloris Manchester [phone]) on 12/31/2022 4:38:58 PM  Radiology MR Cervical Spine Wo Contrast  Result Date: 12/31/2022 CLINICAL DATA:  Initial evaluation for acute trauma, fall. EXAM: MRI CERVICAL SPINE WITHOUT CONTRAST TECHNIQUE: Multiplanar, multisequence MR imaging of the cervical spine was performed. No intravenous contrast was administered. COMPARISON:  Prior CT from earlier the same day. FINDINGS: Alignment: Straightening with mild reversal of the normal cervical lordosis. Trace degenerative retrolisthesis of C5 on C6. Vertebrae: Vertebral body height maintained without acute or chronic fracture. Bone marrow signal intensity within normal limits. No discrete or worrisome osseous lesions. No abnormal marrow edema. Cord: Normal signal and morphology. No evidence for ligamentous injury. Posterior Fossa, vertebral arteries, paraspinal tissues: Craniocervical junction normal. Small focus of mild edema present within the posterior paraspinous  musculature at the level of C4-5 (series 8, image 8, 12, likely mild muscular injury/strain in the setting of trauma. Paraspinous soft tissues demonstrate no other acute finding. Normal flow voids seen within the vertebral arteries bilaterally. Disc levels: C2-C3: Mild disc bulge with uncovertebral hypertrophy. Mild right-sided facet hypertrophy. No significant spinal stenosis. Foramina remain adequately patent. C3-C4: Mild uncovertebral spurring without significant disc bulge. Mild facet hypertrophy, greater on the right. No spinal stenosis. Foramina remain patent. C4-C5: Degenerate intervertebral disc space narrowing with circumferential disc osteophyte complex. Posterior component flattens and partially effaces the ventral thecal sac. Mild cord flattening without cord signal changes. Mild spinal stenosis. Mild right with mild to moderate left C5 foraminal stenosis. C5-C6: Degenerative intervertebral disc space narrowing with circumferential disc osteophyte complex. Broad posterior component flattens and effaces the ventral thecal sac. Superimposed mild facet and ligament flavum hypertrophy. Mild cord flattening without cord signal changes. Moderate spinal stenosis with severe bilateral C6 foraminal narrowing. C6-C7: Mild disc bulge with uncovertebral spurring. Mild facet hypertrophy. No spinal stenosis. Foramina remain patent. C7-T1: Negative interspace. Mild facet hypertrophy. No spinal stenosis. Foramina remain patent. IMPRESSION: 1. Mild edema within the posterior paraspinous musculature at the level of C4-5, likely mild muscular injury/strain in the setting of trauma. 2. No other evidence for acute traumatic injury within the cervical spine. No evidence for traumatic cord or ligamentous injury. 3. Multilevel cervical spondylosis with resultant mild to moderate spinal stenosis at C4-5  and C5-6. Associated mild to moderate left C5 foraminal narrowing, with severe bilateral C6 foraminal stenosis. Electronically  Signed   By: Rise Mu M.D.   On: 12/31/2022 21:51   MR BRAIN WO CONTRAST  Result Date: 12/31/2022 CLINICAL DATA:  Initial evaluation for mental status change, unknown cause. EXAM: MRI HEAD WITHOUT CONTRAST TECHNIQUE: Multiplanar, multiecho pulse sequences of the brain and surrounding structures were obtained without intravenous contrast. COMPARISON:  Prior CT from earlier the same day. FINDINGS: Brain: Diffuse prominence of the CSF containing spaces compatible generalized cerebral atrophy. Patchy T2/FLAIR hyperintensity involving the periventricular deep white matter, consistent with chronic small vessel ischemic disease, mild for age. Encephalomalacia and gliosis involving the right occipital lobe consistent with a chronic right PCA distribution infarct. No evidence for acute or subacute ischemia. Gray-white matter differentiation otherwise maintained. No other as a chronic cortical infarction. No acute intracranial hemorrhage. Single punctate chronic microhemorrhage noted at the left external capsule. No mass lesion, midline shift or mass effect. No hydrocephalus or extra-axial fluid collection. Pituitary gland suprasellar region within normal limits. Vascular: Major intracranial vascular flow voids are maintained. Skull and upper cervical spine: Craniocervical junction within normal limits. Bone marrow signal intensity normal. No scalp soft tissue abnormality. Sinuses/Orbits: Prior bilateral ocular lens replacement. Scattered mucosal thickening noted throughout the paranasal sinuses, most pronounced at the frontoethmoidal region. No mastoid effusion. Other: None. IMPRESSION: 1. No acute intracranial abnormality. 2. Chronic right PCA distribution infarct. 3. 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 Electronically Signed   By: Rise Mu M.D.   On: 12/31/2022 21:37   DG Shoulder Left  Result Date: 12/31/2022 CLINICAL DATA:  Status post  fall, left shoulder pain EXAM: LEFT SHOULDER - 2+ VIEW COMPARISON:  None Available. FINDINGS: No fracture or dislocation. Generalized osteopenia. Mild degenerative changes of the acromioclavicular joint. Soft tissues are normal. IMPRESSION: 1. No acute osseous injury of the left shoulder. Electronically Signed   By: Elige Ko M.D.   On: 12/31/2022 18:35   CT L-SPINE NO CHARGE  Result Date: 12/31/2022 CLINICAL DATA:  Fall. EXAM: CT THORACIC AND LUMBAR SPINE WITHOUT CONTRAST TECHNIQUE: Multi planar CT imaging of the thoracic and lumbar spine were reconstructed from CT chest, abdomen and pelvis without contrast. RADIATION DOSE REDUCTION: This exam was performed according to the departmental dose-optimization program which includes automated exposure control, adjustment of the mA and/or kV according to patient size and/or use of iterative reconstruction technique. COMPARISON:  None Available. FINDINGS: CT THORACIC SPINE FINDINGS Alignment: Normal. Vertebrae: Normal vertebral body heights. No acute fracture or suspicious bone lesions. Paraspinal and other soft tissues: Unremarkable. Disc levels: Mild degenerative changes of the midthoracic spine. No spinal canal stenosis or neural foraminal narrowing. CT LUMBAR SPINE FINDINGS Segmentation: Conventional numbering is assumed with 5 non-rib-bearing, lumbar type vertebral bodies. Alignment: Normal. Vertebrae: Normal vertebral body heights. No acute fracture or suspicious bone lesion. Multilevel degenerative endplate changes. Paraspinal and other soft tissues: Unremarkable. Disc levels: Multilevel lumbar spondylosis, worst at L3-4 and L4-5, where there is at least mild spinal canal stenosis. IMPRESSION: 1. No acute fracture or traumatic listhesis of the thoracic or lumbar spine. 2. Multilevel lumbar spondylosis, worst at L3-4 and L4-5, where there is at least mild spinal canal stenosis. Electronically Signed   By: Orvan Falconer M.D.   On: 12/31/2022 16:19   CT  T-SPINE NO CHARGE  Result Date: 12/31/2022 CLINICAL DATA:  Fall. EXAM: CT THORACIC AND LUMBAR SPINE WITHOUT CONTRAST TECHNIQUE: Multi planar  CT imaging of the thoracic and lumbar spine were reconstructed from CT chest, abdomen and pelvis without contrast. RADIATION DOSE REDUCTION: This exam was performed according to the departmental dose-optimization program which includes automated exposure control, adjustment of the mA and/or kV according to patient size and/or use of iterative reconstruction technique. COMPARISON:  None Available. FINDINGS: CT THORACIC SPINE FINDINGS Alignment: Normal. Vertebrae: Normal vertebral body heights. No acute fracture or suspicious bone lesions. Paraspinal and other soft tissues: Unremarkable. Disc levels: Mild degenerative changes of the midthoracic spine. No spinal canal stenosis or neural foraminal narrowing. CT LUMBAR SPINE FINDINGS Segmentation: Conventional numbering is assumed with 5 non-rib-bearing, lumbar type vertebral bodies. Alignment: Normal. Vertebrae: Normal vertebral body heights. No acute fracture or suspicious bone lesion. Multilevel degenerative endplate changes. Paraspinal and other soft tissues: Unremarkable. Disc levels: Multilevel lumbar spondylosis, worst at L3-4 and L4-5, where there is at least mild spinal canal stenosis. IMPRESSION: 1. No acute fracture or traumatic listhesis of the thoracic or lumbar spine. 2. Multilevel lumbar spondylosis, worst at L3-4 and L4-5, where there is at least mild spinal canal stenosis. Electronically Signed   By: Orvan Falconer M.D.   On: 12/31/2022 16:19   CT HEAD WO CONTRAST  Result Date: 12/31/2022 CLINICAL DATA:  Head trauma, moderate-severe; Polytrauma, blunt. Fall. EXAM: CT HEAD WITHOUT CONTRAST CT CERVICAL SPINE WITHOUT CONTRAST CT CHEST, ABDOMEN AND PELVIS WITHOUT CONTRAST TECHNIQUE: Contiguous axial images were obtained from the base of the skull through the vertex without intravenous contrast. Multidetector CT  imaging of the cervical spine was performed without intravenous contrast. Multiplanar CT image reconstructions were also generated. Multidetector CT imaging of the chest, abdomen and pelvis was performed following the standard protocol without IV contrast. RADIATION DOSE REDUCTION: This exam was performed according to the departmental dose-optimization program which includes automated exposure control, adjustment of the mA and/or kV according to patient size and/or use of iterative reconstruction technique. COMPARISON:  Head CT 01/25/2019.  CT abdomen/pelvis 09/12/2015. FINDINGS: CT HEAD FINDINGS Brain: No acute hemorrhage. 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 Cortical gray-white differentiation is otherwise preserved. Background of mild chronic small-vessel disease. No hydrocephalus or extra-axial collection. No mass effect or midline shift. Vascular: No hyperdense vessel or unexpected calcification. Skull: No calvarial fracture or suspicious bone lesion. Skull base is unremarkable. Sinuses/Orbits: Mild paranasal sinus disease. Orbits are unremarkable. Other: None. CT CERVICAL FINDINGS Alignment: Normal. Skull base and vertebrae: No acute fracture. Normal craniocervical junction. No suspicious bone lesions. Soft tissues and spinal canal: No prevertebral fluid or swelling. No visible canal hematoma. Disc levels: Mild cervical spondylosis without high-grade spinal canal stenosis. Other: Atherosclerotic calcifications of the carotid bulbs. CT CHEST FINDINGS Cardiovascular: No acute vascular findings. Normal heart size. No pericardial effusion. Severe coronary artery calcifications. Atherosclerotic calcifications of the thoracic aorta. Mediastinum/Nodes: No enlarged mediastinal, hilar, or axillary lymph nodes. Thyroid gland, trachea, and esophagus demonstrate no significant findings. Lungs/Pleura: Subsegmental atelectasis in the lung bases. No consolidation, pulmonary edema or traumatic findings. No pleural  effusion or pneumothorax. Musculoskeletal: No chest wall mass or suspicious bone lesions identified. CT ABDOMEN AND PELVIS FINDINGS Hepatobiliary: No hepatic injury or perihepatic hematoma. Gallbladder is unremarkable. Pancreas: Unremarkable. No pancreatic ductal dilatation or surrounding inflammatory changes. Spleen: No splenic injury or perisplenic hematoma. Adrenals/Urinary Tract: No adrenal hemorrhage or renal injury identified. Bladder is unremarkable. Stomach/Bowel: Stomach is within normal limits. Appendix appears normal. No evidence of bowel wall thickening, distention, or inflammatory changes. Vascular/Lymphatic: Aortic atherosclerosis. No enlarged abdominal or pelvic lymph nodes.  Reproductive: Calcified uterine fibroids.  No adnexal masses. Other: No abdominal wall hernia or abnormality. No abdominopelvic ascites. Musculoskeletal: No fracture is seen. IMPRESSION: 1. No acute intracranial abnormality. 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 2. No acute fracture or traumatic listhesis of the cervical spine. 3. No acute traumatic findings in the chest, abdomen, or pelvis. 4. Coronary artery disease. Aortic Atherosclerosis (ICD10-I70.0). Electronically Signed   By: Orvan Falconer M.D.   On: 12/31/2022 16:14   CT CERVICAL SPINE WO CONTRAST  Result Date: 12/31/2022 CLINICAL DATA:  Head trauma, moderate-severe; Polytrauma, blunt. Fall. EXAM: CT HEAD WITHOUT CONTRAST CT CERVICAL SPINE WITHOUT CONTRAST CT CHEST, ABDOMEN AND PELVIS WITHOUT CONTRAST TECHNIQUE: Contiguous axial images were obtained from the base of the skull through the vertex without intravenous contrast. Multidetector CT imaging of the cervical spine was performed without intravenous contrast. Multiplanar CT image reconstructions were also generated. Multidetector CT imaging of the chest, abdomen and pelvis was performed following the standard protocol without IV contrast. RADIATION DOSE REDUCTION: This exam was performed according to the  departmental dose-optimization program which includes automated exposure control, adjustment of the mA and/or kV according to patient size and/or use of iterative reconstruction technique. COMPARISON:  Head CT 01/25/2019.  CT abdomen/pelvis 09/12/2015. FINDINGS: CT HEAD FINDINGS Brain: No acute hemorrhage. 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 Cortical gray-white differentiation is otherwise preserved. Background of mild chronic small-vessel disease. No hydrocephalus or extra-axial collection. No mass effect or midline shift. Vascular: No hyperdense vessel or unexpected calcification. Skull: No calvarial fracture or suspicious bone lesion. Skull base is unremarkable. Sinuses/Orbits: Mild paranasal sinus disease. Orbits are unremarkable. Other: None. CT CERVICAL FINDINGS Alignment: Normal. Skull base and vertebrae: No acute fracture. Normal craniocervical junction. No suspicious bone lesions. Soft tissues and spinal canal: No prevertebral fluid or swelling. No visible canal hematoma. Disc levels: Mild cervical spondylosis without high-grade spinal canal stenosis. Other: Atherosclerotic calcifications of the carotid bulbs. CT CHEST FINDINGS Cardiovascular: No acute vascular findings. Normal heart size. No pericardial effusion. Severe coronary artery calcifications. Atherosclerotic calcifications of the thoracic aorta. Mediastinum/Nodes: No enlarged mediastinal, hilar, or axillary lymph nodes. Thyroid gland, trachea, and esophagus demonstrate no significant findings. Lungs/Pleura: Subsegmental atelectasis in the lung bases. No consolidation, pulmonary edema or traumatic findings. No pleural effusion or pneumothorax. Musculoskeletal: No chest wall mass or suspicious bone lesions identified. CT ABDOMEN AND PELVIS FINDINGS Hepatobiliary: No hepatic injury or perihepatic hematoma. Gallbladder is unremarkable. Pancreas: Unremarkable. No pancreatic ductal dilatation or surrounding inflammatory changes. Spleen: No splenic  injury or perisplenic hematoma. Adrenals/Urinary Tract: No adrenal hemorrhage or renal injury identified. Bladder is unremarkable. Stomach/Bowel: Stomach is within normal limits. Appendix appears normal. No evidence of bowel wall thickening, distention, or inflammatory changes. Vascular/Lymphatic: Aortic atherosclerosis. No enlarged abdominal or pelvic lymph nodes. Reproductive: Calcified uterine fibroids.  No adnexal masses. Other: No abdominal wall hernia or abnormality. No abdominopelvic ascites. Musculoskeletal: No fracture is seen. IMPRESSION: 1. No acute intracranial abnormality. 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 2. No acute fracture or traumatic listhesis of the cervical spine. 3. No acute traumatic findings in the chest, abdomen, or pelvis. 4. Coronary artery disease. Aortic Atherosclerosis (ICD10-I70.0). Electronically Signed   By: Orvan Falconer M.D.   On: 12/31/2022 16:14   CT CHEST ABDOMEN PELVIS WO CONTRAST  Result Date: 12/31/2022 CLINICAL DATA:  Head trauma, moderate-severe; Polytrauma, blunt. Fall. EXAM: CT HEAD WITHOUT CONTRAST CT CERVICAL SPINE WITHOUT CONTRAST CT CHEST, ABDOMEN AND PELVIS WITHOUT CONTRAST TECHNIQUE: Contiguous axial images were obtained  from the base of the skull through the vertex without intravenous contrast. Multidetector CT imaging of the cervical spine was performed without intravenous contrast. Multiplanar CT image reconstructions were also generated. Multidetector CT imaging of the chest, abdomen and pelvis was performed following the standard protocol without IV contrast. RADIATION DOSE REDUCTION: This exam was performed according to the departmental dose-optimization program which includes automated exposure control, adjustment of the mA and/or kV according to patient size and/or use of iterative reconstruction technique. COMPARISON:  Head CT 01/25/2019.  CT abdomen/pelvis 09/12/2015. FINDINGS: CT HEAD FINDINGS Brain: No acute hemorrhage. Old infarct in the  right occipital lobe. Cortical gray-white differentiation is otherwise preserved. Background of mild chronic small-vessel disease. No hydrocephalus or extra-axial collection. No mass effect or midline shift. Vascular: No hyperdense vessel or unexpected calcification. Skull: No calvarial fracture or suspicious bone lesion. Skull base is unremarkable. Sinuses/Orbits: Mild paranasal sinus disease. Orbits are unremarkable. Other: None. CT CERVICAL FINDINGS Alignment: Normal. Skull base and vertebrae: No acute fracture. Normal craniocervical junction. No suspicious bone lesions. Soft tissues and spinal canal: No prevertebral fluid or swelling. No visible canal hematoma. Disc levels: Mild cervical spondylosis without high-grade spinal canal stenosis. Other: Atherosclerotic calcifications of the carotid bulbs. CT CHEST FINDINGS Cardiovascular: No acute vascular findings. Normal heart size. No pericardial effusion. Severe coronary artery calcifications. Atherosclerotic calcifications of the thoracic aorta. Mediastinum/Nodes: No enlarged mediastinal, hilar, or axillary lymph nodes. Thyroid gland, trachea, and esophagus demonstrate no significant findings. Lungs/Pleura: Subsegmental atelectasis in the lung bases. No consolidation, pulmonary edema or traumatic findings. No pleural effusion or pneumothorax. Musculoskeletal: No chest wall mass or suspicious bone lesions identified. CT ABDOMEN AND PELVIS FINDINGS Hepatobiliary: No hepatic injury or perihepatic hematoma. Gallbladder is unremarkable. Pancreas: Unremarkable. No pancreatic ductal dilatation or surrounding inflammatory changes. Spleen: No splenic injury or perisplenic hematoma. Adrenals/Urinary Tract: No adrenal hemorrhage or renal injury identified. Bladder is unremarkable. Stomach/Bowel: Stomach is within normal limits. Appendix appears normal. No evidence of bowel wall thickening, distention, or inflammatory changes. Vascular/Lymphatic: Aortic atherosclerosis. No  enlarged abdominal or pelvic lymph nodes. Reproductive: Calcified uterine fibroids.  No adnexal masses. Other: No abdominal wall hernia or abnormality. No abdominopelvic ascites. Musculoskeletal: No fracture is seen. IMPRESSION: 1. No acute intracranial abnormality. 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 2. No acute fracture or traumatic listhesis of the cervical spine. 3. No acute traumatic findings in the chest, abdomen, or pelvis. 4. Coronary artery disease. Aortic Atherosclerosis (ICD10-I70.0). Electronically Signed   By: Orvan Falconer M.D.   On: 12/31/2022 16:14   DG Elbow Complete Left  Result Date: 12/31/2022 CLINICAL DATA:  Status post fall, left elbow pain EXAM: LEFT ELBOW - COMPLETE 3+ VIEW COMPARISON:  None Available. FINDINGS: There is no evidence of fracture, dislocation, or joint effusion. There is no evidence of arthropathy or other focal bone abnormality. Soft tissues are unremarkable. IMPRESSION: Negative. Electronically Signed   By: Elige Ko M.D.   On: 12/31/2022 15:05   DG Pelvis Portable  Result Date: 12/31/2022 CLINICAL DATA:  Status post fall, trauma EXAM: PORTABLE PELVIS 1-2 VIEWS COMPARISON:  None Available. FINDINGS: No acute fracture or dislocation. No aggressive osseous lesion. Normal alignment. Generalized osteopenia. Lower lumbar spine spondylosis. Calcified right uterine fibroid. Peripheral vascular atherosclerotic disease. No radiopaque foreign body or soft tissue emphysema. IMPRESSION: 1. No acute osseous injury of the pelvis. If there is further clinical concern, recommend a CT of the pelvis. Electronically Signed   By: Elige Ko M.D.   On: 12/31/2022 15:04  DG Chest Port 1 View  Result Date: 12/31/2022 CLINICAL DATA:  Status post fall, trauma EXAM: PORTABLE CHEST 1 VIEW COMPARISON:  06/04/2020 FINDINGS: Mild lingular scarring versus atelectasis. No focal consolidation. No pleural effusion or pneumothorax. Heart and mediastinal contours are unremarkable. No  acute osseous abnormality. IMPRESSION: No active disease. Electronically Signed   By: Elige Ko M.D.   On: 12/31/2022 15:02    Procedures Procedures    Medications Ordered in ED Medications  lactated ringers bolus 500 mL (has no administration in time range)  insulin aspart (novoLOG) injection 0-9 Units (has no administration in time range)  meropenem (MERREM) 1 g in sodium chloride 0.9 % 100 mL IVPB (has no administration in time range)  fluconazole (DIFLUCAN) tablet 50 mg (50 mg Oral Given 12/31/22 2043)  acetaminophen (TYLENOL) tablet 650 mg (650 mg Oral Given 12/31/22 1953)  cefTRIAXone (ROCEPHIN) 1 g in sodium chloride 0.9 % 100 mL IVPB (0 g Intravenous Stopped 12/31/22 2038)  sodium chloride 0.9 % bolus 500 mL (0 mLs Intravenous Stopped 12/31/22 2146)    ED Course/ Medical Decision Making/ A&P                                 Medical Decision Making Amount and/or Complexity of Data Reviewed Labs: ordered. Radiology: ordered. ECG/medicine tests: ordered.  Risk OTC drugs. Prescription drug management. Decision regarding hospitalization.   This patient presents to the ED for concern of fall, this involves an extensive number of treatment options, and is a complaint that carries with it a high risk of complications and morbidity.  The differential diagnosis includes acute injuries, underlying infection, underlying metabolic derangements   Co morbidities that complicate the patient evaluation  bipolar disorder, CKD, CHF, HTN, T2DM   Additional history obtained:  Additional history obtained from EMS, nursing facility staff External records from outside source obtained and reviewed including EMR   Lab Tests:  I Ordered, and personally interpreted labs.  The pertinent results include: Baseline CKD, azotemia, hypercalcemia and hypomagnesemia with otherwise normal electrolytes, mild leukocytosis, baseline anemia   Imaging Studies ordered:  I ordered imaging studies  including x-ray of chest, pelvis, left elbow, left shoulder; CT of head, cervical spine, chest, abdomen, pelvis, T-spine, L-spine; MRI brain and cervical spine I independently visualized and interpreted imaging which showed mild edema in posterior paraspinous musculature without other acute findings I agree with the radiologist interpretation   Cardiac Monitoring: / EKG:  The patient was maintained on a cardiac monitor.  I personally viewed and interpreted the cardiac monitored which showed an underlying rhythm of: Sinus rhythm   Problem List / ED Course / Critical interventions / Medication management  Patient presenting after fall off of bed.  This occurred at nursing facility.  It was unwitnessed.  Patient describes the fall on scene and is landing directly on her head.  She endorses neck pain, headache pain, and left shoulder pain.  Patient currently moving all extremities.  Vital signs are notable for hypertension.  She is reportedly on an antibiotic for treatment of UTI.  Per review of nursing facility paperwork, she takes a daily trimethoprim tablet.  This is likely for prophylaxis.  Will check urine today in addition to other lab work.  Lab work shows creatinine consistent with lab work from 3 weeks ago.  Per chart review, she was seen by nephrology at [facility].  During that visit, she did undergo urinalysis.  There  is a telephone visit note from August 5 noting that her urine showed evidence of infection of ESBL.  Urinalysis today does show nitrite positive bacteria.  She was started on ceftriaxone.  Urine cultures were ordered.  Fortunately, no acute injuries were identified on CT imaging.  When speaking with the patient, she is alert and oriented but slowed in speech and movements.  She does continue to have a global weakness.  I spoke with the staff at her nursing facility who state that she is typically conversant and able to have normal conversations.  They state that she was in this  condition earlier today.  At baseline, she is able to walk small distances with a walker.  When attempting to stand today in the ED, she is unable to.  Given this acute weakness in the setting of recent trauma, patient to undergo MRI brain and cervical spine.  Patient was admitted to medicine for further management. I ordered medication including IV fluids for hydration; ceftriaxone for UTI; Diflucan for presence of budding yeast on UA; Tylenol for analgesia Reevaluation of the patient after these medicines showed that the patient improved I have reviewed the patients home medicines and have made adjustments as needed   Social Determinants of Health:  Resides in nursing facility         Final Clinical Impression(s) / ED Diagnoses Final diagnoses:  Fall, initial encounter  Cystitis  Chronic kidney disease, unspecified CKD stage  Generalized weakness    Rx / DC Orders ED Discharge Orders     None         Gloris Manchester, MD 12/31/22 2219

## 2022-12-31 NOTE — Assessment & Plan Note (Addendum)
Order SSI Does not sem to be on long lasting insulin anymore

## 2022-12-31 NOTE — Assessment & Plan Note (Signed)
Chronic-stable.

## 2022-12-31 NOTE — Assessment & Plan Note (Signed)
Order cpap.

## 2022-12-31 NOTE — Assessment & Plan Note (Addendum)
Continue home meds Norvasc 10 mg po q day hydralazine 100 mg po tid Coreg 12.5 mg po bid

## 2022-12-31 NOTE — Assessment & Plan Note (Signed)
Obtain urine electrolytes and rehydrate ?

## 2022-12-31 NOTE — Assessment & Plan Note (Signed)
Cont Depakote sprinkles Check depakote level

## 2022-12-31 NOTE — Assessment & Plan Note (Signed)
Continue lipitor 20mg po q day 

## 2022-12-31 NOTE — ED Triage Notes (Signed)
Patient BIB [facility] from [facility]. Patient here for unwitnessed fall, Dx. With fracture left arm from previous fall. Unknown down time on floor. No blood thinner. C/O neck pain. A&Ox4. Bed bound. Dx. With UTI and being treated with ABT. 152/62, CBG 119, 80, 18, 100%ra.

## 2022-12-31 NOTE — Assessment & Plan Note (Signed)
No ECho in our system will obtain

## 2022-12-31 NOTE — Assessment & Plan Note (Signed)
ESBL it seems pt has been prescribed Ertapenem IM Obtain Urine culture for now cover with meropenem IV and await results May need adjustment of antibiotic coverage

## 2022-12-31 NOTE — ED Notes (Signed)
RN attempted to call Zada Finders, daughter.

## 2022-12-31 NOTE — Progress Notes (Signed)
Pharmacy Antibiotic Note  Samantha [name] is a 77 y.o. female admitted on 12/31/2022 with acute encephalopathy with previous UTI and on bactrim for prophylaxis and office visit on 8/5 with Ucx showing ESBL that is resistant to everything (cannot find Ucx). Has been previously getting ertapenem IM per bed sidfe notes. Pharmacy has been consulted for meropenem dosing. WBC 11, sCr 3.7 (similar to baseline), afebrile. Ucx and Bcx pending.  Plan: Meropenem 500 IV every 12 hours Monitor renal function Follow Cultures for de-escalation, WBC, signs of clinical improvement, LOT  Height: 5\' 4"  (162.6 cm) Weight: 88.9 kg (195 lb 15.8 oz) IBW/kg (Calculated) : 54.7  Temp (24hrs), Avg:98.4 F (36.9 C), Min:98.4 F (36.9 C), Max:98.4 F (36.9 C)  Recent Labs  Lab 12/31/22 1425 12/31/22 1454 12/31/22 1455  WBC 11.1*  --   --   CREATININE 3.54* 3.70*  --   LATICACIDVEN  --   --  1.8    Estimated Creatinine Clearance: 14 mL/min (A) (by C-G formula based on SCr of 3.7 mg/dL (H)).    Allergies  Allergen Reactions   Peanut-Containing Drug Products Anaphylaxis and Swelling   Alprazolam Other (See Comments)    weakness   Cauliflower [Brassica Oleracea]     Not listed on the [facility]    Antimicrobials this admission: meropenem 8/18 >>   Microbiology results: 8/18 Bcx: 8/18 UCx:    Thank you for allowing pharmacy to be a part of this patient's care.  Fayrene Fearing [facility] 12/31/2022 11:18 PM

## 2023-01-01 ENCOUNTER — Inpatient Hospital Stay (HOSPITAL_COMMUNITY): Payer: Medicare Other

## 2023-01-01 ENCOUNTER — Encounter (HOSPITAL_COMMUNITY): Payer: Self-pay | Admitting: Internal Medicine

## 2023-01-01 DIAGNOSIS — N179 Acute kidney failure, unspecified: Secondary | ICD-10-CM | POA: Diagnosis not present

## 2023-01-01 DIAGNOSIS — G9340 Encephalopathy, unspecified: Secondary | ICD-10-CM | POA: Diagnosis not present

## 2023-01-01 DIAGNOSIS — R0609 Other forms of dyspnea: Secondary | ICD-10-CM | POA: Diagnosis not present

## 2023-01-01 DIAGNOSIS — W19XXXA Unspecified fall, initial encounter: Secondary | ICD-10-CM

## 2023-01-01 DIAGNOSIS — Z7189 Other specified counseling: Secondary | ICD-10-CM | POA: Diagnosis not present

## 2023-01-01 LAB — CBC
HCT: 35.6 % — ABNORMAL LOW (ref 36.0–46.0)
Hemoglobin: 10.9 g/dL — ABNORMAL LOW (ref 12.0–15.0)
MCH: 26.3 pg (ref 26.0–34.0)
MCHC: 30.6 g/dL (ref 30.0–36.0)
MCV: 86 fL (ref 80.0–100.0)
Platelets: 206 10*3/uL (ref 150–400)
RBC: 4.14 MIL/uL (ref 3.87–5.11)
RDW: 14.3 % (ref 11.5–15.5)
WBC: 10.9 10*3/uL — ABNORMAL HIGH (ref 4.0–10.5)
nRBC: 0 % (ref 0.0–0.2)

## 2023-01-01 LAB — COMPREHENSIVE METABOLIC PANEL
ALT: 9 U/L (ref 0–44)
AST: 19 U/L (ref 15–41)
Albumin: 3.7 g/dL (ref 3.5–5.0)
Alkaline Phosphatase: 61 U/L (ref 38–126)
Anion gap: 9 (ref 5–15)
BUN: 57 mg/dL — ABNORMAL HIGH (ref 8–23)
CO2: 25 mmol/L (ref 22–32)
Calcium: 10.7 mg/dL — ABNORMAL HIGH (ref 8.9–10.3)
Chloride: 103 mmol/L (ref 98–111)
Creatinine, Ser: 3.27 mg/dL — ABNORMAL HIGH (ref 0.44–1.00)
GFR, Estimated: 14 mL/min — ABNORMAL LOW (ref >60)
Glucose, Bld: 81 mg/dL (ref 70–99)
Potassium: 5.1 mmol/L (ref 3.5–5.1)
Sodium: 137 mmol/L (ref 135–145)
Total Bilirubin: 0.8 mg/dL (ref 0.3–1.2)
Total Protein: 7.5 g/dL (ref 6.5–8.1)

## 2023-01-01 LAB — MAGNESIUM: Magnesium: 2.4 mg/dL (ref 1.7–2.4)

## 2023-01-01 LAB — CBG MONITORING, ED
Glucose-Capillary: 130 mg/dL — ABNORMAL HIGH (ref 70–99)
Glucose-Capillary: 72 mg/dL (ref 70–99)
Glucose-Capillary: 87 mg/dL (ref 70–99)
Glucose-Capillary: 90 mg/dL (ref 70–99)
Glucose-Capillary: 92 mg/dL (ref 70–99)

## 2023-01-01 LAB — AMMONIA: Ammonia: 17 umol/L (ref 9–35)

## 2023-01-01 LAB — PHOSPHORUS: Phosphorus: 3.3 mg/dL (ref 2.5–4.6)

## 2023-01-01 LAB — URINE CULTURE: Culture: NO GROWTH

## 2023-01-01 LAB — ECHOCARDIOGRAM COMPLETE
Est EF: >75
Height: 64 in
S' Lateral: 1.6 cm
Weight: 3135.82 [oz_av]

## 2023-01-01 LAB — ETHANOL: Alcohol, Ethyl (B): <10 mg/dL (ref <10)

## 2023-01-01 LAB — TROPONIN I (HIGH SENSITIVITY): Troponin I (High Sensitivity): 11 ng/L (ref <18)

## 2023-01-01 LAB — BRAIN NATRIURETIC PEPTIDE: B Natriuretic Peptide: 12.4 pg/mL (ref 0.0–100.0)

## 2023-01-01 LAB — PROCALCITONIN: Procalcitonin: <0.1 ng/mL

## 2023-01-01 LAB — PREALBUMIN: Prealbumin: 28 mg/dL (ref 18–38)

## 2023-01-01 LAB — GLUCOSE, CAPILLARY
Glucose-Capillary: 144 mg/dL — ABNORMAL HIGH (ref 70–99)
Glucose-Capillary: 150 mg/dL — ABNORMAL HIGH (ref 70–99)

## 2023-01-01 LAB — SARS CORONAVIRUS 2 BY RT PCR: SARS Coronavirus 2 by RT PCR: NEGATIVE

## 2023-01-01 LAB — VALPROIC ACID LEVEL: Valproic Acid Lvl: <10 ug/mL — ABNORMAL LOW (ref 50.0–100.0)

## 2023-01-01 MED ORDER — HALOPERIDOL 1 MG PO TABS
1.0000 mg | ORAL_TABLET | Freq: Every day | ORAL | Status: DC
  Administered 2023-01-01: 1 mg via ORAL
  Filled 2023-01-01: qty 1

## 2023-01-01 MED ORDER — AMLODIPINE BESYLATE 10 MG PO TABS
10.0000 mg | ORAL_TABLET | Freq: Every day | ORAL | Status: DC
  Administered 2023-01-01 – 2023-01-04 (×4): 10 mg via ORAL
  Filled 2023-01-01: qty 2
  Filled 2023-01-01 (×3): qty 1

## 2023-01-01 MED ORDER — CARVEDILOL 12.5 MG PO TABS: 12.5000 mg | ORAL_TABLET | Freq: Two times a day (BID) | ORAL | Status: DC

## 2023-01-01 MED ORDER — ASPIRIN 81 MG PO TBEC
81.0000 mg | DELAYED_RELEASE_TABLET | Freq: Every day | ORAL | Status: DC
  Administered 2023-01-01 – 2023-01-04 (×4): 81 mg via ORAL
  Filled 2023-01-01 (×4): qty 1

## 2023-01-01 MED ORDER — ALBUTEROL SULFATE HFA 108 (90 BASE) MCG/ACT IN AERS: 2.0000 | INHALATION_SPRAY | Freq: Four times a day (QID) | RESPIRATORY_TRACT | Status: DC | PRN

## 2023-01-01 MED ORDER — ATORVASTATIN CALCIUM 10 MG PO TABS: 20.0000 mg | ORAL_TABLET | Freq: Every day | ORAL | Status: DC

## 2023-01-01 MED ORDER — LATANOPROST 0.005 % OP SOLN
1.0000 [drp] | Freq: Every day | OPHTHALMIC | Status: DC
  Administered 2023-01-01 – 2023-01-03 (×3): 1 [drp] via OPHTHALMIC
  Filled 2023-01-01: qty 2.5

## 2023-01-01 MED ORDER — DEXTROSE 50 % IV SOLN
INTRAVENOUS | Status: AC
  Administered 2023-01-01: 50 mL via INTRAVENOUS
  Filled 2023-01-01: qty 50

## 2023-01-01 MED ORDER — BISACODYL 5 MG PO TBEC: 5.0000 mg | DELAYED_RELEASE_TABLET | Freq: Every day | ORAL | Status: DC | PRN

## 2023-01-01 MED ORDER — PERFLUTREN LIPID MICROSPHERE
1.0000 mL | INTRAVENOUS | Status: AC | PRN
  Administered 2023-01-01: 3 mL via INTRAVENOUS

## 2023-01-01 MED ORDER — TRAZODONE HCL 50 MG PO TABS
50.0000 mg | ORAL_TABLET | Freq: Every day | ORAL | Status: DC
  Administered 2023-01-01 (×2): 50 mg via ORAL
  Filled 2023-01-01 (×2): qty 1

## 2023-01-01 MED ORDER — HALOPERIDOL 0.5 MG PO TABS
0.5000 mg | ORAL_TABLET | Freq: Every morning | ORAL | Status: DC
  Administered 2023-01-01 – 2023-01-02 (×2): 0.5 mg via ORAL
  Filled 2023-01-01 (×2): qty 1

## 2023-01-01 MED ORDER — TAMSULOSIN HCL 0.4 MG PO CAPS
0.4000 mg | ORAL_CAPSULE | Freq: Every day | ORAL | Status: DC
  Administered 2023-01-01 – 2023-01-04 (×4): 0.4 mg via ORAL
  Filled 2023-01-01 (×4): qty 1

## 2023-01-01 MED ORDER — ATORVASTATIN CALCIUM 10 MG PO TABS
20.0000 mg | ORAL_TABLET | Freq: Every day | ORAL | Status: DC
  Administered 2023-01-01 – 2023-01-03 (×3): 20 mg via ORAL
  Filled 2023-01-01 (×3): qty 2

## 2023-01-01 MED ORDER — ONDANSETRON HCL 4 MG/2ML IJ SOLN: 4.0000 mg | Freq: Four times a day (QID) | INTRAMUSCULAR | Status: DC | PRN

## 2023-01-01 MED ORDER — ACETAMINOPHEN 325 MG PO TABS: 650.0000 mg | ORAL_TABLET | Freq: Four times a day (QID) | ORAL | Status: DC | PRN

## 2023-01-01 MED ORDER — ACETAMINOPHEN 650 MG RE SUPP: 650.0000 mg | Freq: Four times a day (QID) | RECTAL | Status: DC | PRN

## 2023-01-01 MED ORDER — PREGABALIN 25 MG PO CAPS
50.0000 mg | ORAL_CAPSULE | Freq: Every day | ORAL | Status: DC
  Administered 2023-01-01 – 2023-01-02 (×2): 50 mg via ORAL
  Filled 2023-01-01 (×2): qty 2

## 2023-01-01 MED ORDER — HALOPERIDOL 0.5 MG PO TABS: 0.5000 mg | ORAL_TABLET | Freq: Two times a day (BID) | ORAL | Status: DC

## 2023-01-01 MED ORDER — MOMETASONE FURO-FORMOTEROL FUM 200-5 MCG/ACT IN AERO: 2.0000 | INHALATION_SPRAY | Freq: Two times a day (BID) | RESPIRATORY_TRACT | Status: DC

## 2023-01-01 MED ORDER — HYDRALAZINE HCL 50 MG PO TABS
100.0000 mg | ORAL_TABLET | Freq: Three times a day (TID) | ORAL | Status: DC
  Administered 2023-01-01 – 2023-01-04 (×10): 100 mg via ORAL
  Filled 2023-01-01 (×10): qty 2

## 2023-01-01 MED ORDER — DEXTROSE 50 % IV SOLN: 1.0000 | INTRAVENOUS | Status: DC | PRN

## 2023-01-01 MED ORDER — ALBUTEROL SULFATE (2.5 MG/3ML) 0.083% IN NEBU: 3.0000 mL | INHALATION_SOLUTION | Freq: Four times a day (QID) | RESPIRATORY_TRACT | Status: DC | PRN

## 2023-01-01 MED ORDER — SODIUM CHLORIDE 0.9 % IV SOLN: INTRAVENOUS | Status: AC

## 2023-01-01 MED ORDER — ONDANSETRON HCL 4 MG PO TABS: 4.0000 mg | ORAL_TABLET | Freq: Four times a day (QID) | ORAL | Status: DC | PRN

## 2023-01-01 MED ORDER — LIDOCAINE 5 % EX PTCH
1.0000 | MEDICATED_PATCH | CUTANEOUS | Status: DC
  Administered 2023-01-02 – 2023-01-03 (×2): 1 via TRANSDERMAL
  Filled 2023-01-01 (×2): qty 1

## 2023-01-01 MED ORDER — PANTOPRAZOLE SODIUM 40 MG PO TBEC
40.0000 mg | DELAYED_RELEASE_TABLET | Freq: Every day | ORAL | Status: DC
  Administered 2023-01-01 – 2023-01-04 (×4): 40 mg via ORAL
  Filled 2023-01-01 (×4): qty 1

## 2023-01-01 MED ORDER — DIVALPROEX SODIUM 125 MG PO DR TAB
125.0000 mg | DELAYED_RELEASE_TABLET | Freq: Two times a day (BID) | ORAL | Status: DC
  Administered 2023-01-01 – 2023-01-04 (×7): 125 mg via ORAL
  Filled 2023-01-01 (×8): qty 1

## 2023-01-01 NOTE — Progress Notes (Signed)
Bedside nurse reported that patient blood glucose dropped to 71 and she gave patient D50 bolus once.  And has been admitted earlier for the evaluation of fall and confusion.  Patient has history of diabetes and currently patient is n.p.o. however she was on subcu SSI as needed which I have been taking of at this time given patient is developing hypotensive episode and currently NPO. Order POC blood glucose check every 4 hours and as needed D50 1 amp/50 mL has been on board. Once patient will be more alert diet can be resumed and in that case we can resume low sliding scale SSI coverage as needed.   Tereasa Coop, MD [facility] 01/01/2023, 4:27 AM

## 2023-01-01 NOTE — Hospital Course (Addendum)
77 y.o. female with medical history significant of bipolar, CKD stage 4, CHF, HTN, DM2, CVA, hypercalcemia, obesity , GERD, neuropathy, Mobitz type I heart block presented with a fall and confusion from [facility] facility, apparently had multiple falls.  Recently diagnosed with UTI and being treated for this had ESBL and seems was prescribed IM Invanz. In the ED CT head CT neck, lumbar thoracic spine with old infarct in the right occipital lobe, no fracture in thoracic or lumbar spine showed multileveL spondylosis. CT chest abdomen pelvis> no acute fracture or traumatic findings in the chest abdomen or pelvis. X-ray of left elbow pelvis no acute finding MRI brain MRI C-spine> no acute traumatic injury and C-spine multilevel cervical spondylolysis, mildly edema in the posterior paraspinous musculature at C4-C5 likely muscular injury/strain.  MRI brain with chronic right PCA distribution infarct and some chronic cerebral atrophy Labs with creatinine 3.5 BUN 61 calcium 11.3 normal LFTs CK 50 mild leukocytosis anemia glucose 113, UA with WBC 0-5 nitrate positive LE trace squamous epithelial cells 11-20. Patient was admitted for acute encephalopathy and concern for ongoing ESBL UTI Blood culture has been sent and admitted.

## 2023-01-01 NOTE — Progress Notes (Signed)
PROGRESS NOTE Samantha [name]  ZOX:[id number] DOB: [birth year] DOA: 12/31/2022 PCP: Renford Dills, MD  Brief Narrative/Hospital Course: 77 y.o. female with medical history significant of bipolar, CKD stage 4, CHF, HTN, DM2, CVA, hypercalcemia, obesity , GERD, neuropathy, Mobitz type I heart block presented with a fall and confusion from [facility] facility, apparently had multiple falls.  Recently diagnosed with UTI and being treated for this had ESBL and seems was prescribed IM Invanz. In the ED CT head CT neck, lumbar thoracic spine with old infarct in the right occipital lobe, no fracture in thoracic or lumbar spine showed multileveL spondylosis. CT chest abdomen pelvis> no acute fracture or traumatic findings in the chest abdomen or pelvis. X-ray of left elbow pelvis no acute finding MRI brain MRI C-spine> no acute traumatic injury and C-spine multilevel cervical spondylolysis, mildly edema in the posterior paraspinous musculature at C4-C5 likely muscular injury/strain.  MRI brain with chronic right PCA distribution infarct and some chronic cerebral atrophy Labs with creatinine 3.5 BUN 61 calcium 11.3 normal LFTs CK 50 mild leukocytosis anemia glucose 113, UA with WBC 0-5 nitrate positive LE trace squamous epithelial cells 11-20. Patient was admitted for acute encephalopathy and ESBL UTI Blood culture has been sent   Subjective: Seen this am Alert aware oriented to self dob, month/year hospital Says she was trying to get up from the bed and fell Overnight BP 120s to  160s, saturating well on room air afebrile Labs with potassium 5.1 creatinine slightly better 3.2 from 3.7 calcium downtrending 10.7-with Ruben 3.7, the patient down to 10.9 hemoglobin 10.9 platelets stable.  HbA1c 6.5.  Last VBG stable with pCO2 51 pH 7.3 On admission normal lactic acid with procalcitonin negative less than 0.1 Blood sugar was 72 and D50 was given   Assessment and Plan: Principal Problem:    Acute encephalopathy Active Problems:   Bipolar 1 disorder, depressed (HCC)   Acute renal failure superimposed on stage 3 chronic kidney disease (HCC)   AV block, Mobitz 1   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)   CHF (congestive heart failure) (HCC)   Hypertension associated with stage 3 chronic kidney disease due to type 2 diabetes mellitus (HCC)   Sleep apnea, unspecified   Type 2 diabetes mellitus with diabetic chronic kidney disease (HCC)   UTI (urinary tract infection)   Hyperlipidemia   Acute encephalopathy: Unclear etiology likely multifactorial with AKI.  Extensive workup done in the ED including CT and MRI scans see report above.  This morning she is alert awake oriented fairly and interacting appropriately.  Continue supportive care delirium precaution fall precaution PT OT  Recent ESBL UTI at a skilled facility and was getting Invanz: Discussed with pharmacy, per report she has completed 10 days of Invanz which is appropriate.  UA in the ED not impressive for UTI, relatively asymptomatic afebrile no leukocytosis.  Hold off on further IV antibiotics and watch w/o.  AKI on CKD 4/5: Her creatinine was 2.1-in 2022 with a EGFR 26, in Care Everywhere creatinine 3.0 egfr 16 IN 08/07/22, creat 3.2 egfr 14 in 12/11/22. We will cont NS at 50 cc next 24 hrs Recent Labs    12/31/22 1425 12/31/22 1454 01/01/23 0143  BUN 61* 61* 57*  CREATININE 3.54* 3.70* 3.27*  CO2 24  --  25    AV block Mobitz type I chronic: No acute issues.  Hypertension: BP fairly controlled continue amlodipine.  History of sleep apnea; CPAP as able  Type  2 diabetes mellitus: Blood sugar fairly controlled keep on SSI and monitor. Recent Labs  Lab 12/31/22 2258 01/01/23 0011 01/01/23 0405 01/01/23 0519 01/01/23 0733 01/01/23 1110  GLUCAP  --  90 72 130* 92 87  HGBA1C 6.5*  --   --   --   --   --     History of CHF: Echo has been ordered, shows- lvef>75%, left  ventricle has hyperdynamic function. No RWMA ,Indeterminate diastolic filling due to E-A fusion. Check bnp.  Goals of care: FULL CODE. PMT consulted 8/19, cont on supportive.  Class I Obesity:Patient's Body mass index is 33.64 kg/m. : Will benefit with PCP follow-up, weight loss  healthy lifestyle   DVT prophylaxis: SCDs Start: 01/01/23 0024 Code Status:   Code Status: Full Code Family Communication: plan of care discussed with patient/RN at bedside. Patient status is: Inpatient because of potential Level of care: Telemetry Medical   Dispo: The patient is from: [facility]            Anticipated disposition: SNF in 1-2 days  Objective: Vitals last 24 hrs: Vitals:   01/01/23 0705 01/01/23 0730 01/01/23 0745 01/01/23 1003  BP:  120/62 (!) 162/71 (!) 155/78  Pulse:  82 82   Resp:  12 10   Temp: 97.6 F (36.4 C)     TempSrc: Oral     SpO2:  97% 97%   Weight:      Height:       Weight change:   Physical Examination: General exam: alert awake, older than stated age HEENT:Oral mucosa moist, Ear/Nose WNL grossly Respiratory system: bilaterally dimionished at base and clear , on RA, no use of accessory muscle Cardiovascular system: S1 & S2 +, No JVD. Gastrointestinal system: Abdomen soft,NT FULL BS+ Nervous System:Alert, awake, moving extremities. Extremities: LE edema neg ,distal peripheral pulses palpable.  Skin: No rashes,no icterus. MSK: Normal muscle bulk,tone, power  Medications reviewed:  Scheduled Meds:  amLODipine  10 mg Oral Daily   aspirin EC  81 mg Oral Daily   atorvastatin  20 mg Oral QHS   divalproex  125 mg Oral Q12H   haloperidol  0.5 mg Oral q AM   And   haloperidol  1 mg Oral QHS   hydrALAZINE  100 mg Oral TID   latanoprost  1 drop Both Eyes QHS   pantoprazole  40 mg Oral Daily   pregabalin  50 mg Oral Daily   tamsulosin  0.4 mg Oral Daily   traZODone  50 mg Oral QHS   Continuous Infusions:  sodium chloride 75 mL/hr at 01/01/23 0140    meropenem (MERREM) IV        Diet Order             Diet NPO time specified  Diet effective now                  Intake/Output Summary (Last 24 hours) at 01/01/2023 1024 Last data filed at 01/01/2023 0019 Gross per 24 hour  Intake 1200 ml  Output --  Net 1200 ml   Net IO Since Admission: 1,200 mL [01/01/23 1024]  Wt Readings from Last 3 Encounters:  12/31/22 88.9 kg  06/04/20 84 kg  09/01/13 98.9 kg     Unresulted Labs (From admission, onward)     Start     Ordered   12/31/22 2138  Urine Culture (for pregnant, neutropenic or urologic patients or patients with an indwelling urinary catheter)  (Urine Labs)  Once,  URGENT       Question:  Indication  Answer:  Altered mental status (if no other cause identified)   12/31/22 2137          Data Reviewed: I have personally reviewed following labs and imaging studies CBC: Recent Labs  Lab 12/31/22 1425 12/31/22 1454 01/01/23 0357  WBC 11.1*  --  10.9*  HGB 11.3* 12.6 10.9*  HCT 36.1 37.0 35.6*  MCV 86.8  --  86.0  PLT 213  --  206   Basic Metabolic Panel: Recent Labs  Lab 12/31/22 1425 12/31/22 1454 01/01/23 0143  NA 138 137 137  K 5.0 5.1 5.1  CL 99 105 103  CO2 24  --  25  GLUCOSE 113* 109* 81  BUN 61* 61* 57*  CREATININE 3.54* 3.70* 3.27*  CALCIUM 11.3*  --  10.7*  MG 2.5*  --  2.4  PHOS  --   --  3.3   GFR: Estimated Creatinine Clearance: 15.8 mL/min (A) (by C-G formula based on SCr of 3.27 mg/dL (H)). Liver Function Tests: Recent Labs  Lab 12/31/22 1425 01/01/23 0143  AST 14* 19  ALT 8 9  ALKPHOS 66 61  BILITOT 0.2* 0.8  PROT 7.7 7.5  ALBUMIN 3.9 3.7   No results for input(s): "LIPASE", "AMYLASE" in the last 168 hours. Recent Labs  Lab 01/01/23 0143  AMMONIA 17   Coagulation Profile: Recent Labs  Lab 12/31/22 1425  INR 1.0   BNP (last 3 results) No results for input(s): "PROBNP" in the last 8760 hours. HbA1C: Recent Labs    12/31/22 2258  HGBA1C 6.5*   CBG: Recent  Labs  Lab 01/01/23 0011 01/01/23 0405 01/01/23 0519 01/01/23 0733  GLUCAP 90 72 130* 92   Lipid Profile: No results for input(s): "CHOL", "HDL", "LDLCALC", "TRIG", "CHOLHDL", "LDLDIRECT" in the last 72 hours. Thyroid Function Tests: No results for input(s): "TSH", "T4TOTAL", "FREET4", "T3FREE", "THYROIDAB" in the last 72 hours. Sepsis Labs: Recent Labs  Lab 12/31/22 1455 12/31/22 2258  PROCALCITON  --  <0.10  LATICACIDVEN 1.8  --     Recent Results (from the past 240 hour(s))  Blood culture (routine x 2)     Status: None (Preliminary result)   Collection Time: 12/31/22  2:22 PM   Specimen: BLOOD  Result Value Ref Range Status   Specimen Description BLOOD RIGHT ANTECUBITAL  Final   Special Requests   Final    BOTTLES DRAWN AEROBIC AND ANAEROBIC Blood Culture adequate volume   Culture   Final    NO GROWTH < 24 HOURS Performed at [facility], [address], [place], [place] [address]    Report Status PENDING  Incomplete  SARS Coronavirus 2 by RT PCR (hospital order, performed in [facility] hospital lab) *cepheid single result test* Peripheral     Status: None   Collection Time: 12/31/22 10:58 PM   Specimen: Peripheral; Nasal Swab  Result Value Ref Range Status   SARS Coronavirus 2 by RT PCR NEGATIVE NEGATIVE Final    Comment: Performed at [facility], [address], [place], [place] [address]  Blood culture (routine x 2)     Status: None (Preliminary result)   Collection Time: 12/31/22 11:16 PM   Specimen: BLOOD  Result Value Ref Range Status   Specimen Description BLOOD SITE NOT SPECIFIED  Final   Special Requests   Final    BOTTLES DRAWN AEROBIC AND ANAEROBIC Blood Culture adequate volume   Culture  Final    NO GROWTH < 12 HOURS Performed at [facility], [address] [place], [place] [address]    Report Status PENDING  Incomplete    Antimicrobials: Anti-infectives (From admission, onward)    Start     Dose/Rate Route Frequency Ordered  Stop   01/01/23 1200  meropenem (MERREM) 500 mg in sodium chloride 0.9 % 100 mL IVPB        500 mg 200 mL/hr over 30 Minutes Intravenous Every 12 hours 12/31/22 2334     12/31/22 2215  meropenem (MERREM) 1 g in sodium chloride 0.9 % 100 mL IVPB        1 g 200 mL/hr over 30 Minutes Intravenous  Once 12/31/22 2201 12/31/22 2350   12/31/22 1915  cefTRIAXone (ROCEPHIN) 1 g in sodium chloride 0.9 % 100 mL IVPB        1 g 200 mL/hr over 30 Minutes Intravenous  Once 12/31/22 1907 12/31/22 2038   12/31/22 1900  fluconazole (DIFLUCAN) tablet 50 mg        50 mg Oral  Once 12/31/22 1846 12/31/22 2043      Culture/Microbiology    Component Value Date/Time   SDES BLOOD SITE NOT SPECIFIED 12/31/2022 2316   SPECREQUEST  12/31/2022 2316    BOTTLES DRAWN AEROBIC AND ANAEROBIC Blood Culture adequate volume   CULT  12/31/2022 2316    NO GROWTH < 12 HOURS Performed at [facility], [address], [place], [place] [address]    REPTSTATUS PENDING 12/31/2022 2316    Radiology Studies: ECHOCARDIOGRAM COMPLETE  Result Date: 01/01/2023    ECHOCARDIOGRAM REPORT   Patient Name:   PHENYX MATSUSHITA Date of Exam: 01/01/2023 Medical Rec #:  [id number]       Height:       64.0 in Accession #:    [id number]      Weight:       196.0 lb Date of Birth:  06/08/1945        BSA:          1.940 m Patient Age:    77 years        BP:           140/67 mmHg Patient Gender: F               HR:           81 bpm. Exam Location:  Inpatient Procedure: 2D Echo, Color Doppler and Cardiac Doppler Indications:    R06.9 DOE  History:        Patient has no prior history of Echocardiogram examinations.                 CHF; Risk Factors:Hypertension, Diabetes, Dyslipidemia, Sleep                 Apnea and CKD.  Sonographer:    Irving Burton Senior RDCS Referring Phys: Consepcion Hearing ANASTASSIA DOUTOVA IMPRESSIONS  1. Left ventricular ejection fraction, by estimation, is >75%. Left ventricular ejection fraction by PLAX is 73 %. The left ventricle has  hyperdynamic function. The left ventricle has no regional wall motion abnormalities. There is moderate concentric left ventricular hypertrophy of the septal segment. Indeterminate diastolic filling due to E-A fusion.  2. Right ventricular systolic function is normal. The right ventricular size is normal. There is normal pulmonary artery systolic pressure. The estimated right ventricular systolic pressure is 29.0 mmHg.  3. The mitral valve is normal in structure. No evidence of  mitral valve regurgitation. No evidence of mitral stenosis.  4. The aortic valve is tricuspid. Aortic valve regurgitation is not visualized. No aortic stenosis is present.  5. The inferior vena cava is normal in size with greater than 50% respiratory variability, suggesting right atrial pressure of 3 mmHg. Comparison(s): No prior Echocardiogram. FINDINGS  Left Ventricle: Left ventricular ejection fraction, by estimation, is >75%. Left ventricular ejection fraction by PLAX is 73 %. The left ventricle has hyperdynamic function. The left ventricle has no regional wall motion abnormalities. The left ventricular internal cavity size was normal in size. There is moderate concentric left ventricular hypertrophy of the septal segment. Indeterminate diastolic filling due to E-A fusion. Right Ventricle: The right ventricular size is normal. Right ventricular systolic function is normal. There is normal pulmonary artery systolic pressure. The tricuspid regurgitant velocity is 2.55 m/s, and with an assumed right atrial pressure of 3 mmHg,  the estimated right ventricular systolic pressure is 29.0 mmHg. Left Atrium: Left atrial size was normal in size. Right Atrium: Right atrial size was normal in size. Pericardium: Trivial pericardial effusion is present. Presence of epicardial fat layer. Mitral Valve: The mitral valve is normal in structure. No evidence of mitral valve regurgitation. No evidence of mitral valve stenosis. Tricuspid Valve: The tricuspid  valve is normal in structure. Tricuspid valve regurgitation is mild . No evidence of tricuspid stenosis. Aortic Valve: The aortic valve is tricuspid. Aortic valve regurgitation is not visualized. No aortic stenosis is present. Pulmonic Valve: The pulmonic valve was normal in structure. Pulmonic valve regurgitation is not visualized. No evidence of pulmonic stenosis. Aorta: The aortic root and ascending aorta are structurally normal, with no evidence of dilitation. Venous: The inferior vena cava is normal in size with greater than 50% respiratory variability, suggesting right atrial pressure of 3 mmHg. IAS/Shunts: No atrial level shunt detected by color flow Doppler.  LEFT VENTRICLE PLAX 2D LV EF:         Left ventricular ejection fraction by PLAX is 73 %. LVIDd:         2.70 cm LVIDs:         1.60 cm LV PW:         1.10 cm LV IVS:        1.40 cm LVOT diam:     1.90 cm LV SV:         58 LV SV Index:   30 LVOT Area:     2.84 cm  RIGHT VENTRICLE RV S prime:     8.92 cm/s TAPSE (M-mode): 2.0 cm LEFT ATRIUM             Index        RIGHT ATRIUM           Index LA diam:        2.20 cm 1.13 cm/m   RA Area:     16.10 cm LA Vol (A2C):   33.8 ml 17.43 ml/m  RA Volume:   37.60 ml  19.39 ml/m LA Vol (A4C):   36.7 ml 18.92 ml/m LA Biplane Vol: 36.9 ml 19.02 ml/m  AORTIC VALVE LVOT Vmax:   101.00 cm/s LVOT Vmean:  73.900 cm/s LVOT VTI:    0.205 m  AORTA Ao Root diam: 2.80 cm Ao Asc diam:  3.50 cm TRICUSPID VALVE TR Peak grad:   26.0 mmHg TR Vmax:        255.00 cm/s  SHUNTS Systemic VTI:  0.20 m Systemic Diam: 1.90 cm Olga Millers MD  Electronically signed by Olga Millers MD Signature Date/Time: 01/01/2023/9:47:57 AM    Final    MR Cervical Spine Wo Contrast  Result Date: 12/31/2022 CLINICAL DATA:  Initial evaluation for acute trauma, fall. EXAM: MRI CERVICAL SPINE WITHOUT CONTRAST TECHNIQUE: Multiplanar, multisequence MR imaging of the cervical spine was performed. No intravenous contrast was administered.  COMPARISON:  Prior CT from earlier the same day. FINDINGS: Alignment: Straightening with mild reversal of the normal cervical lordosis. Trace degenerative retrolisthesis of C5 on C6. Vertebrae: Vertebral body height maintained without acute or chronic fracture. Bone marrow signal intensity within normal limits. No discrete or worrisome osseous lesions. No abnormal marrow edema. Cord: Normal signal and morphology. No evidence for ligamentous injury. Posterior Fossa, vertebral arteries, paraspinal tissues: Craniocervical junction normal. Small focus of mild edema present within the posterior paraspinous musculature at the level of C4-5 (series 8, image 8, 12, likely mild muscular injury/strain in the setting of trauma. Paraspinous soft tissues demonstrate no other acute finding. Normal flow voids seen within the vertebral arteries bilaterally. Disc levels: C2-C3: Mild disc bulge with uncovertebral hypertrophy. Mild right-sided facet hypertrophy. No significant spinal stenosis. Foramina remain adequately patent. C3-C4: Mild uncovertebral spurring without significant disc bulge. Mild facet hypertrophy, greater on the right. No spinal stenosis. Foramina remain patent. C4-C5: Degenerate intervertebral disc space narrowing with circumferential disc osteophyte complex. Posterior component flattens and partially effaces the ventral thecal sac. Mild cord flattening without cord signal changes. Mild spinal stenosis. Mild right with mild to moderate left C5 foraminal stenosis. C5-C6: Degenerative intervertebral disc space narrowing with circumferential disc osteophyte complex. Broad posterior component flattens and effaces the ventral thecal sac. Superimposed mild facet and ligament flavum hypertrophy. Mild cord flattening without cord signal changes. Moderate spinal stenosis with severe bilateral C6 foraminal narrowing. C6-C7: Mild disc bulge with uncovertebral spurring. Mild facet hypertrophy. No spinal stenosis. Foramina  remain patent. C7-T1: Negative interspace. Mild facet hypertrophy. No spinal stenosis. Foramina remain patent. IMPRESSION: 1. Mild edema within the posterior paraspinous musculature at the level of C4-5, likely mild muscular injury/strain in the setting of trauma. 2. No other evidence for acute traumatic injury within the cervical spine. No evidence for traumatic cord or ligamentous injury. 3. Multilevel cervical spondylosis with resultant mild to moderate spinal stenosis at C4-5 and C5-6. Associated mild to moderate left C5 foraminal narrowing, with severe bilateral C6 foraminal stenosis. Electronically Signed   By: Rise Mu M.D.   On: 12/31/2022 21:51   MR BRAIN WO CONTRAST  Result Date: 12/31/2022 CLINICAL DATA:  Initial evaluation for mental status change, unknown cause. EXAM: MRI HEAD WITHOUT CONTRAST TECHNIQUE: Multiplanar, multiecho pulse sequences of the brain and surrounding structures were obtained without intravenous contrast. COMPARISON:  Prior CT from earlier the same day. FINDINGS: Brain: Diffuse prominence of the CSF containing spaces compatible generalized cerebral atrophy. Patchy T2/FLAIR hyperintensity involving the periventricular deep white matter, consistent with chronic small vessel ischemic disease, mild for age. Encephalomalacia and gliosis involving the right occipital lobe consistent with a chronic right PCA distribution infarct. No evidence for acute or subacute ischemia. Gray-white matter differentiation otherwise maintained. No other as a chronic cortical infarction. No acute intracranial hemorrhage. Single punctate chronic microhemorrhage noted at the left external capsule. No mass lesion, midline shift or mass effect. No hydrocephalus or extra-axial fluid collection. Pituitary gland suprasellar region within normal limits. Vascular: Major intracranial vascular flow voids are maintained. Skull and upper cervical spine: Craniocervical junction within normal limits. Bone  marrow signal intensity normal. No scalp soft  tissue abnormality. Sinuses/Orbits: Prior bilateral ocular lens replacement. Scattered mucosal thickening noted throughout the paranasal sinuses, most pronounced at the frontoethmoidal region. No mastoid effusion. Other: None. IMPRESSION: 1. No acute intracranial abnormality. 2. Chronic right PCA distribution infarct. 3. 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 Electronically Signed   By: Rise Mu M.D.   On: 12/31/2022 21:37   DG Shoulder Left  Result Date: 12/31/2022 CLINICAL DATA:  Status post fall, left shoulder pain EXAM: LEFT SHOULDER - 2+ VIEW COMPARISON:  None Available. FINDINGS: No fracture or dislocation. Generalized osteopenia. Mild degenerative changes of the acromioclavicular joint. Soft tissues are normal. IMPRESSION: 1. No acute osseous injury of the left shoulder. Electronically Signed   By: Elige Ko M.D.   On: 12/31/2022 18:35   CT L-SPINE NO CHARGE  Result Date: 12/31/2022 CLINICAL DATA:  Fall. EXAM: CT THORACIC AND LUMBAR SPINE WITHOUT CONTRAST TECHNIQUE: Multi planar CT imaging of the thoracic and lumbar spine were reconstructed from CT chest, abdomen and pelvis without contrast. RADIATION DOSE REDUCTION: This exam was performed according to the departmental dose-optimization program which includes automated exposure control, adjustment of the mA and/or kV according to patient size and/or use of iterative reconstruction technique. COMPARISON:  None Available. FINDINGS: CT THORACIC SPINE FINDINGS Alignment: Normal. Vertebrae: Normal vertebral body heights. No acute fracture or suspicious bone lesions. Paraspinal and other soft tissues: Unremarkable. Disc levels: Mild degenerative changes of the midthoracic spine. No spinal canal stenosis or neural foraminal narrowing. CT LUMBAR SPINE FINDINGS Segmentation: Conventional numbering is assumed with 5 non-rib-bearing, lumbar type  vertebral bodies. Alignment: Normal. Vertebrae: Normal vertebral body heights. No acute fracture or suspicious bone lesion. Multilevel degenerative endplate changes. Paraspinal and other soft tissues: Unremarkable. Disc levels: Multilevel lumbar spondylosis, worst at L3-4 and L4-5, where there is at least mild spinal canal stenosis. IMPRESSION: 1. No acute fracture or traumatic listhesis of the thoracic or lumbar spine. 2. Multilevel lumbar spondylosis, worst at L3-4 and L4-5, where there is at least mild spinal canal stenosis. Electronically Signed   By: Orvan Falconer M.D.   On: 12/31/2022 16:19   CT T-SPINE NO CHARGE  Result Date: 12/31/2022 CLINICAL DATA:  Fall. EXAM: CT THORACIC AND LUMBAR SPINE WITHOUT CONTRAST TECHNIQUE: Multi planar CT imaging of the thoracic and lumbar spine were reconstructed from CT chest, abdomen and pelvis without contrast. RADIATION DOSE REDUCTION: This exam was performed according to the departmental dose-optimization program which includes automated exposure control, adjustment of the mA and/or kV according to patient size and/or use of iterative reconstruction technique. COMPARISON:  None Available. FINDINGS: CT THORACIC SPINE FINDINGS Alignment: Normal. Vertebrae: Normal vertebral body heights. No acute fracture or suspicious bone lesions. Paraspinal and other soft tissues: Unremarkable. Disc levels: Mild degenerative changes of the midthoracic spine. No spinal canal stenosis or neural foraminal narrowing. CT LUMBAR SPINE FINDINGS Segmentation: Conventional numbering is assumed with 5 non-rib-bearing, lumbar type vertebral bodies. Alignment: Normal. Vertebrae: Normal vertebral body heights. No acute fracture or suspicious bone lesion. Multilevel degenerative endplate changes. Paraspinal and other soft tissues: Unremarkable. Disc levels: Multilevel lumbar spondylosis, worst at L3-4 and L4-5, where there is at least mild spinal canal stenosis. IMPRESSION: 1. No acute fracture or  traumatic listhesis of the thoracic or lumbar spine. 2. Multilevel lumbar spondylosis, worst at L3-4 and L4-5, where there is at least mild spinal canal stenosis. Electronically Signed   By: Orvan Falconer M.D.   On: 12/31/2022 16:19   CT HEAD WO CONTRAST  Result Date: 12/31/2022 CLINICAL DATA:  Head trauma, moderate-severe; Polytrauma, blunt. Fall. EXAM: CT HEAD WITHOUT CONTRAST CT CERVICAL SPINE WITHOUT CONTRAST CT CHEST, ABDOMEN AND PELVIS WITHOUT CONTRAST TECHNIQUE: Contiguous axial images were obtained from the base of the skull through the vertex without intravenous contrast. Multidetector CT imaging of the cervical spine was performed without intravenous contrast. Multiplanar CT image reconstructions were also generated. Multidetector CT imaging of the chest, abdomen and pelvis was performed following the standard protocol without IV contrast. RADIATION DOSE REDUCTION: This exam was performed according to the departmental dose-optimization program which includes automated exposure control, adjustment of the mA and/or kV according to patient size and/or use of iterative reconstruction technique. COMPARISON:  Head CT 01/25/2019.  CT abdomen/pelvis 09/12/2015. FINDINGS: CT HEAD FINDINGS Brain: No acute hemorrhage. 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 Cortical gray-white differentiation is otherwise preserved. Background of mild chronic small-vessel disease. No hydrocephalus or extra-axial collection. No mass effect or midline shift. Vascular: No hyperdense vessel or unexpected calcification. Skull: No calvarial fracture or suspicious bone lesion. Skull base is unremarkable. Sinuses/Orbits: Mild paranasal sinus disease. Orbits are unremarkable. Other: None. CT CERVICAL FINDINGS Alignment: Normal. Skull base and vertebrae: No acute fracture. Normal craniocervical junction. No suspicious bone lesions. Soft tissues and spinal canal: No prevertebral fluid or swelling. No visible canal hematoma. Disc levels:  Mild cervical spondylosis without high-grade spinal canal stenosis. Other: Atherosclerotic calcifications of the carotid bulbs. CT CHEST FINDINGS Cardiovascular: No acute vascular findings. Normal heart size. No pericardial effusion. Severe coronary artery calcifications. Atherosclerotic calcifications of the thoracic aorta. Mediastinum/Nodes: No enlarged mediastinal, hilar, or axillary lymph nodes. Thyroid gland, trachea, and esophagus demonstrate no significant findings. Lungs/Pleura: Subsegmental atelectasis in the lung bases. No consolidation, pulmonary edema or traumatic findings. No pleural effusion or pneumothorax. Musculoskeletal: No chest wall mass or suspicious bone lesions identified. CT ABDOMEN AND PELVIS FINDINGS Hepatobiliary: No hepatic injury or perihepatic hematoma. Gallbladder is unremarkable. Pancreas: Unremarkable. No pancreatic ductal dilatation or surrounding inflammatory changes. Spleen: No splenic injury or perisplenic hematoma. Adrenals/Urinary Tract: No adrenal hemorrhage or renal injury identified. Bladder is unremarkable. Stomach/Bowel: Stomach is within normal limits. Appendix appears normal. No evidence of bowel wall thickening, distention, or inflammatory changes. Vascular/Lymphatic: Aortic atherosclerosis. No enlarged abdominal or pelvic lymph nodes. Reproductive: Calcified uterine fibroids.  No adnexal masses. Other: No abdominal wall hernia or abnormality. No abdominopelvic ascites. Musculoskeletal: No fracture is seen. IMPRESSION: 1. No acute intracranial abnormality. 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 2. No acute fracture or traumatic listhesis of the cervical spine. 3. No acute traumatic findings in the chest, abdomen, or pelvis. 4. Coronary artery disease. Aortic Atherosclerosis (ICD10-I70.0). Electronically Signed   By: Orvan Falconer M.D.   On: 12/31/2022 16:14   CT CERVICAL SPINE WO CONTRAST  Result Date: 12/31/2022 CLINICAL DATA:  Head trauma, moderate-severe;  Polytrauma, blunt. Fall. EXAM: CT HEAD WITHOUT CONTRAST CT CERVICAL SPINE WITHOUT CONTRAST CT CHEST, ABDOMEN AND PELVIS WITHOUT CONTRAST TECHNIQUE: Contiguous axial images were obtained from the base of the skull through the vertex without intravenous contrast. Multidetector CT imaging of the cervical spine was performed without intravenous contrast. Multiplanar CT image reconstructions were also generated. Multidetector CT imaging of the chest, abdomen and pelvis was performed following the standard protocol without IV contrast. RADIATION DOSE REDUCTION: This exam was performed according to the departmental dose-optimization program which includes automated exposure control, adjustment of the mA and/or kV according to patient size and/or use of iterative reconstruction technique. COMPARISON:  Head CT 01/25/2019.  CT abdomen/pelvis 09/12/2015. FINDINGS: CT HEAD FINDINGS Brain: No acute hemorrhage. 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 Cortical gray-white differentiation is otherwise preserved. Background of mild chronic small-vessel disease. No hydrocephalus or extra-axial collection. No mass effect or midline shift. Vascular: No hyperdense vessel or unexpected calcification. Skull: No calvarial fracture or suspicious bone lesion. Skull base is unremarkable. Sinuses/Orbits: Mild paranasal sinus disease. Orbits are unremarkable. Other: None. CT CERVICAL FINDINGS Alignment: Normal. Skull base and vertebrae: No acute fracture. Normal craniocervical junction. No suspicious bone lesions. Soft tissues and spinal canal: No prevertebral fluid or swelling. No visible canal hematoma. Disc levels: Mild cervical spondylosis without high-grade spinal canal stenosis. Other: Atherosclerotic calcifications of the carotid bulbs. CT CHEST FINDINGS Cardiovascular: No acute vascular findings. Normal heart size. No pericardial effusion. Severe coronary artery calcifications. Atherosclerotic calcifications of the thoracic aorta.  Mediastinum/Nodes: No enlarged mediastinal, hilar, or axillary lymph nodes. Thyroid gland, trachea, and esophagus demonstrate no significant findings. Lungs/Pleura: Subsegmental atelectasis in the lung bases. No consolidation, pulmonary edema or traumatic findings. No pleural effusion or pneumothorax. Musculoskeletal: No chest wall mass or suspicious bone lesions identified. CT ABDOMEN AND PELVIS FINDINGS Hepatobiliary: No hepatic injury or perihepatic hematoma. Gallbladder is unremarkable. Pancreas: Unremarkable. No pancreatic ductal dilatation or surrounding inflammatory changes. Spleen: No splenic injury or perisplenic hematoma. Adrenals/Urinary Tract: No adrenal hemorrhage or renal injury identified. Bladder is unremarkable. Stomach/Bowel: Stomach is within normal limits. Appendix appears normal. No evidence of bowel wall thickening, distention, or inflammatory changes. Vascular/Lymphatic: Aortic atherosclerosis. No enlarged abdominal or pelvic lymph nodes. Reproductive: Calcified uterine fibroids.  No adnexal masses. Other: No abdominal wall hernia or abnormality. No abdominopelvic ascites. Musculoskeletal: No fracture is seen. IMPRESSION: 1. No acute intracranial abnormality. 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 2. No acute fracture or traumatic listhesis of the cervical spine. 3. No acute traumatic findings in the chest, abdomen, or pelvis. 4. Coronary artery disease. Aortic Atherosclerosis (ICD10-I70.0). Electronically Signed   By: Orvan Falconer M.D.   On: 12/31/2022 16:14   CT CHEST ABDOMEN PELVIS WO CONTRAST  Result Date: 12/31/2022 CLINICAL DATA:  Head trauma, moderate-severe; Polytrauma, blunt. Fall. EXAM: CT HEAD WITHOUT CONTRAST CT CERVICAL SPINE WITHOUT CONTRAST CT CHEST, ABDOMEN AND PELVIS WITHOUT CONTRAST TECHNIQUE: Contiguous axial images were obtained from the base of the skull through the vertex without intravenous contrast. Multidetector CT imaging of the cervical spine was performed  without intravenous contrast. Multiplanar CT image reconstructions were also generated. Multidetector CT imaging of the chest, abdomen and pelvis was performed following the standard protocol without IV contrast. RADIATION DOSE REDUCTION: This exam was performed according to the departmental dose-optimization program which includes automated exposure control, adjustment of the mA and/or kV according to patient size and/or use of iterative reconstruction technique. COMPARISON:  Head CT 01/25/2019.  CT abdomen/pelvis 09/12/2015. FINDINGS: CT HEAD FINDINGS Brain: No acute hemorrhage. 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 Cortical gray-white differentiation is otherwise preserved. Background of mild chronic small-vessel disease. No hydrocephalus or extra-axial collection. No mass effect or midline shift. Vascular: No hyperdense vessel or unexpected calcification. Skull: No calvarial fracture or suspicious bone lesion. Skull base is unremarkable. Sinuses/Orbits: Mild paranasal sinus disease. Orbits are unremarkable. Other: None. CT CERVICAL FINDINGS Alignment: Normal. Skull base and vertebrae: No acute fracture. Normal craniocervical junction. No suspicious bone lesions. Soft tissues and spinal canal: No prevertebral fluid or swelling. No visible canal hematoma. Disc levels: Mild cervical spondylosis without high-grade spinal canal stenosis. Other: Atherosclerotic calcifications of the carotid bulbs. CT CHEST  FINDINGS Cardiovascular: No acute vascular findings. Normal heart size. No pericardial effusion. Severe coronary artery calcifications. Atherosclerotic calcifications of the thoracic aorta. Mediastinum/Nodes: No enlarged mediastinal, hilar, or axillary lymph nodes. Thyroid gland, trachea, and esophagus demonstrate no significant findings. Lungs/Pleura: Subsegmental atelectasis in the lung bases. No consolidation, pulmonary edema or traumatic findings. No pleural effusion or pneumothorax. Musculoskeletal: No chest  wall mass or suspicious bone lesions identified. CT ABDOMEN AND PELVIS FINDINGS Hepatobiliary: No hepatic injury or perihepatic hematoma. Gallbladder is unremarkable. Pancreas: Unremarkable. No pancreatic ductal dilatation or surrounding inflammatory changes. Spleen: No splenic injury or perisplenic hematoma. Adrenals/Urinary Tract: No adrenal hemorrhage or renal injury identified. Bladder is unremarkable. Stomach/Bowel: Stomach is within normal limits. Appendix appears normal. No evidence of bowel wall thickening, distention, or inflammatory changes. Vascular/Lymphatic: Aortic atherosclerosis. No enlarged abdominal or pelvic lymph nodes. Reproductive: Calcified uterine fibroids.  No adnexal masses. Other: No abdominal wall hernia or abnormality. No abdominopelvic ascites. Musculoskeletal: No fracture is seen. IMPRESSION: 1. No acute intracranial abnormality. 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 2. No acute fracture or traumatic listhesis of the cervical spine. 3. No acute traumatic findings in the chest, abdomen, or pelvis. 4. Coronary artery disease. Aortic Atherosclerosis (ICD10-I70.0). Electronically Signed   By: Orvan Falconer M.D.   On: 12/31/2022 16:14   DG Elbow Complete Left  Result Date: 12/31/2022 CLINICAL DATA:  Status post fall, left elbow pain EXAM: LEFT ELBOW - COMPLETE 3+ VIEW COMPARISON:  None Available. FINDINGS: There is no evidence of fracture, dislocation, or joint effusion. There is no evidence of arthropathy or other focal bone abnormality. Soft tissues are unremarkable. IMPRESSION: Negative. Electronically Signed   By: Elige Ko M.D.   On: 12/31/2022 15:05   DG Pelvis Portable  Result Date: 12/31/2022 CLINICAL DATA:  Status post fall, trauma EXAM: PORTABLE PELVIS 1-2 VIEWS COMPARISON:  None Available. FINDINGS: No acute fracture or dislocation. No aggressive osseous lesion. Normal alignment. Generalized osteopenia. Lower lumbar spine spondylosis. Calcified right uterine  fibroid. Peripheral vascular atherosclerotic disease. No radiopaque foreign body or soft tissue emphysema. IMPRESSION: 1. No acute osseous injury of the pelvis. If there is further clinical concern, recommend a CT of the pelvis. Electronically Signed   By: Elige Ko M.D.   On: 12/31/2022 15:04   DG Chest Port 1 View  Result Date: 12/31/2022 CLINICAL DATA:  Status post fall, trauma EXAM: PORTABLE CHEST 1 VIEW COMPARISON:  06/04/2020 FINDINGS: Mild lingular scarring versus atelectasis. No focal consolidation. No pleural effusion or pneumothorax. Heart and mediastinal contours are unremarkable. No acute osseous abnormality. IMPRESSION: No active disease. Electronically Signed   By: Elige Ko M.D.   On: 12/31/2022 15:02     LOS: 1 day   Lanae Boast, MD [facility]  01/01/2023, 10:24 AM

## 2023-01-01 NOTE — ED Notes (Signed)
ED TO INPATIENT HANDOFF REPORT  ED Nurse Name and Phone #: [phone]  S Name/Age/Gender Samantha [name] 77 y.o. female Room/Bed: 006C/006C  Code Status   Code Status: Full Code  Home/SNF/Other Home Patient oriented to: self, place, time, and situation Is this baseline? Yes   Triage Complete: Triage complete  Chief Complaint Acute encephalopathy [G93.40]  Triage Note Patient BIB [facility] from [facility]. Patient here for unwitnessed fall, Dx. With fracture left arm from previous fall. Unknown down time on floor. No blood thinner. C/O neck pain. A&Ox4. Bed bound. Dx. With UTI and being treated with ABT. 152/62, CBG 119, 80, 18, 100%ra.    Allergies Allergies  Allergen Reactions   Peanut-Containing Drug Products Anaphylaxis and Swelling   Alprazolam Other (See Comments)    weakness   Cauliflower [Brassica Oleracea]     Not listed on the MAR    Level of Care/Admitting Diagnosis ED Disposition     ED Disposition  Admit   Condition  --   Comment  Hospital Area: [facility] [[id number]]  Level of Care: Telemetry Medical [[id number]]  May admit patient to [facility] if equivalent level of care is available:: No  Covid Evaluation: Asymptomatic - no recent exposure (last 10 days) testing not required  Diagnosis: Acute encephalopathy [914782]  Admitting Physician: Therisa Doyne [[id number]]  Attending Physician: Therisa Doyne [[id number]]  Certification:: I certify this patient will need inpatient services for at least 2 midnights  Expected Medical Readiness: 01/02/2023          B Medical/Surgery History Past Medical History:  Diagnosis Date   Anemia    Bipolar 1 disorder (HCC)    CHF (congestive heart failure) (HCC)    Depression    Diabetes mellitus without complication (HCC)    Hypertension    Neuropathy    Stroke [facility])    Past Surgical History:  Procedure Laterality Date   CESAREAN SECTION       A IV  Location/Drains/Wounds Patient Lines/Drains/Airways Status     Active Line/Drains/Airways     Name Placement date Placement time Site Days   Peripheral IV 12/31/22 18 G Right Antecubital 12/31/22  1405  Antecubital  1            Intake/Output Last 24 hours  Intake/Output Summary (Last 24 hours) at 01/01/2023 1329 Last data filed at 01/01/2023 0019 Gross per 24 hour  Intake 1200 ml  Output --  Net 1200 ml    Labs/Imaging Results for orders placed or performed during the hospital encounter of 12/31/22 (from the past 48 hour(s))  Blood culture (routine x 2)     Status: None (Preliminary result)   Collection Time: 12/31/22  2:22 PM   Specimen: BLOOD  Result Value Ref Range   Specimen Description BLOOD RIGHT ANTECUBITAL    Special Requests      BOTTLES DRAWN AEROBIC AND ANAEROBIC Blood Culture adequate volume   Culture      NO GROWTH < 24 HOURS Performed at [facility], [address] [place], [place] [address]    Report Status PENDING   Comprehensive metabolic panel     Status: Abnormal   Collection Time: 12/31/22  2:25 PM  Result Value Ref Range   Sodium 138 135 - 145 mmol/L   Potassium 5.0 3.5 - 5.1 mmol/L   Chloride 99 98 - 111 mmol/L   CO2 24 22 - 32 mmol/L   Glucose, Bld 113 (H) 70 - 99 mg/dL  Comment: Glucose reference range applies only to samples taken after fasting for at least 8 hours.   BUN 61 (H) 8 - 23 mg/dL   Creatinine, Ser 8.18 (H) 0.44 - 1.00 mg/dL   Calcium 29.9 (H) 8.9 - 10.3 mg/dL   Total Protein 7.7 6.5 - 8.1 g/dL   Albumin 3.9 3.5 - 5.0 g/dL   AST 14 (L) 15 - 41 U/L   ALT 8 0 - 44 U/L   Alkaline Phosphatase 66 38 - 126 U/L   Total Bilirubin 0.2 (L) 0.3 - 1.2 mg/dL   GFR, Estimated 13 (L) >60 mL/min    Comment: (NOTE) Calculated using the CKD-EPI Creatinine Equation (2021)    Anion gap 15 5 - 15    Comment: Performed at [facility], [address] [place], [place] [address]  CBC     Status: Abnormal   Collection Time:  12/31/22  2:25 PM  Result Value Ref Range   WBC 11.1 (H) 4.0 - 10.5 K/uL   RBC 4.16 3.87 - 5.11 MIL/uL   Hemoglobin 11.3 (L) 12.0 - 15.0 g/dL   HCT 67.8 93.8 - 10.1 %   MCV 86.8 80.0 - 100.0 fL   MCH 27.2 26.0 - 34.0 pg   MCHC 31.3 30.0 - 36.0 g/dL   RDW 75.1 02.5 - 85.2 %   Platelets 213 150 - 400 K/uL   nRBC 0.0 0.0 - 0.2 %    Comment: Performed at [facility], [address] [place], [place] [address]  Protime-INR     Status: None   Collection Time: 12/31/22  2:25 PM  Result Value Ref Range   Prothrombin Time 12.9 11.4 - 15.2 seconds   INR 1.0 0.8 - 1.2    Comment: (NOTE) INR goal varies based on device and disease states. Performed at [facility], [address], [place], [place] [address]   Magnesium     Status: Abnormal   Collection Time: 12/31/22  2:25 PM  Result Value Ref Range   Magnesium 2.5 (H) 1.7 - 2.4 mg/dL    Comment: Performed at [facility], [address], [place], [place] [address]  CK     Status: None   Collection Time: 12/31/22  2:25 PM  Result Value Ref Range   Total CK 50 38 - 234 U/L    Comment: Performed at [facility], [address] [place], [place] [address]  I-Stat Chem 8, ED     Status: Abnormal   Collection Time: 12/31/22  2:54 PM  Result Value Ref Range   Sodium 137 135 - 145 mmol/L   Potassium 5.1 3.5 - 5.1 mmol/L   Chloride 105 98 - 111 mmol/L   BUN 61 (H) 8 - 23 mg/dL   Creatinine, Ser 6.76 (H) 0.44 - 1.00 mg/dL   Glucose, Bld 195 (H) 70 - 99 mg/dL    Comment: Glucose reference range applies only to samples taken after fasting for at least 8 hours.   Calcium, Ion 1.40 1.15 - 1.40 mmol/L   TCO2 26 22 - 32 mmol/L   Hemoglobin 12.6 12.0 - 15.0 g/dL   HCT 09.3 26.7 - 12.4 %  I-Stat Lactic Acid, ED     Status: None   Collection Time: 12/31/22  2:55 PM  Result Value Ref Range   Lactic Acid, Venous 1.8 0.5 - 1.9 mmol/L  Troponin I (High Sensitivity)     Status: None   Collection  Time: 12/31/22  4:55 PM   Result Value Ref Range   Troponin I (High Sensitivity) 9 <18 ng/L    Comment: (NOTE) Elevated high sensitivity troponin I (hsTnI) values and significant  changes across serial measurements may suggest ACS but many other  chronic and acute conditions are known to elevate hsTnI results.  Refer to the "Links" section for chest pain algorithms and additional  guidance. Performed at [facility], [address] [place], [place] [address]   Urinalysis, w/ Reflex to Culture (Infection Suspected) -Urine, Clean Catch     Status: Abnormal   Collection Time: 12/31/22  5:12 PM  Result Value Ref Range   Specimen Source URINE, CLEAN CATCH    Color, Urine YELLOW YELLOW   APPearance CLOUDY (A) CLEAR   Specific Gravity, Urine 1.012 1.005 - 1.030   pH 5.0 5.0 - 8.0   Glucose, UA NEGATIVE NEGATIVE mg/dL   Hgb urine dipstick NEGATIVE NEGATIVE   Bilirubin Urine NEGATIVE NEGATIVE   Ketones, ur NEGATIVE NEGATIVE mg/dL   Protein, ur NEGATIVE NEGATIVE mg/dL   Nitrite POSITIVE (A) NEGATIVE   Leukocytes,Ua TRACE (A) NEGATIVE   RBC / HPF 0-5 0 - 5 RBC/hpf   WBC, UA 0-5 0 - 5 WBC/hpf    Comment:        Reflex urine culture not performed if WBC <=10, OR if Squamous epithelial cells >5. If Squamous epithelial cells >5 suggest recollection.    Bacteria, UA RARE (A) NONE SEEN   Squamous Epithelial / HPF 11-20 0 - 5 /HPF   Budding Yeast PRESENT    Hyaline Casts, UA PRESENT     Comment: Performed at [facility], [address] [place], [place] [address]  Troponin I (High Sensitivity)     Status: None   Collection Time: 12/31/22 10:58 PM  Result Value Ref Range   Troponin I (High Sensitivity) 11 <18 ng/L    Comment: (NOTE) Elevated high sensitivity troponin I (hsTnI) values and significant  changes across serial measurements may suggest ACS but many other  chronic and acute conditions are known to elevate hsTnI results.  Refer to the "Links" section for chest pain algorithms and  additional  guidance. Performed at [facility], [address] [place], [place] [address]   Valproic acid level     Status: Abnormal   Collection Time: 12/31/22 10:58 PM  Result Value Ref Range   Valproic Acid Lvl <10 (L) 50.0 - 100.0 ug/mL    Comment: RESULT CONFIRMED BY MANUAL DILUTION Performed at [facility], [address], [place], [place] [address]   SARS Coronavirus 2 by RT PCR (hospital order, performed in [facility] hospital lab) *cepheid single result test* Peripheral     Status: None   Collection Time: 12/31/22 10:58 PM   Specimen: Peripheral; Nasal Swab  Result Value Ref Range   SARS Coronavirus 2 by RT PCR NEGATIVE NEGATIVE    Comment: Performed at [facility], [address], [place], [place] [address]  Procalcitonin     Status: None   Collection Time: 12/31/22 10:58 PM  Result Value Ref Range   Procalcitonin <0.10 ng/mL    Comment:        Interpretation: PCT (Procalcitonin) <= 0.5 ng/mL: Systemic infection (sepsis) is not likely. Local bacterial infection is possible. (NOTE)       Sepsis PCT Algorithm           Lower Respiratory Tract  Infection PCT Algorithm    ----------------------------     ----------------------------         PCT < 0.25 ng/mL                PCT < 0.10 ng/mL          Strongly encourage             Strongly discourage   discontinuation of antibiotics    initiation of antibiotics    ----------------------------     -----------------------------       PCT 0.25 - 0.50 ng/mL            PCT 0.10 - 0.25 ng/mL               OR       >80% decrease in PCT            Discourage initiation of                                            antibiotics      Encourage discontinuation           of antibiotics    ----------------------------     -----------------------------         PCT >= 0.50 ng/mL              PCT 0.26 - 0.50 ng/mL               AND        <80% decrease in PCT              Encourage initiation of                                             antibiotics       Encourage continuation           of antibiotics    ----------------------------     -----------------------------        PCT >= 0.50 ng/mL                  PCT > 0.50 ng/mL               AND         increase in PCT                  Strongly encourage                                      initiation of antibiotics    Strongly encourage escalation           of antibiotics                                     -----------------------------                                           PCT <= 0.25 ng/mL  OR                                        > 80% decrease in PCT                                      Discontinue / Do not initiate                                             antibiotics  Performed at [facility], [address] [place], [place] [address]   Blood gas, venous     Status: Abnormal   Collection Time: 12/31/22 10:58 PM  Result Value Ref Range   pH, Ven 7.35 7.25 - 7.43   pCO2, Ven 51 44 - 60 mmHg   pO2, Ven 37 32 - 45 mmHg   Bicarbonate 28.2 (H) 20.0 - 28.0 mmol/L   Acid-Base Excess 1.7 0.0 - 2.0 mmol/L   O2 Saturation 62.4 %   Patient temperature 37.0     Comment: Performed at [facility], [address], [place], [place] [address]  Hemoglobin A1c     Status: Abnormal   Collection Time: 12/31/22 10:58 PM  Result Value Ref Range   Hgb A1c MFr Bld 6.5 (H) 4.8 - 5.6 %    Comment: (NOTE) Pre diabetes:          5.7%-6.4%  Diabetes:              >6.4%  Glycemic control for   <7.0% adults with diabetes    Mean Plasma Glucose 139.85 mg/dL    Comment: Performed at [facility], [address], [place], [place] [address]  Ethanol     Status: None   Collection Time: 12/31/22 10:58 PM  Result Value Ref Range   Alcohol, Ethyl (B) <10 <10 mg/dL    Comment: (NOTE) Lowest detectable limit for serum alcohol is 10 mg/dL.  For  medical purposes only. Performed at [facility], [address] [place], [place] [address]   Blood culture (routine x 2)     Status: None (Preliminary result)   Collection Time: 12/31/22 11:16 PM   Specimen: BLOOD  Result Value Ref Range   Specimen Description BLOOD SITE NOT SPECIFIED    Special Requests      BOTTLES DRAWN AEROBIC AND ANAEROBIC Blood Culture adequate volume   Culture      NO GROWTH < 12 HOURS Performed at [facility], [address], [place], [place] [address]    Report Status PENDING   CBG monitoring, ED     Status: None   Collection Time: 01/01/23 12:11 AM  Result Value Ref Range   Glucose-Capillary 90 70 - 99 mg/dL    Comment: Glucose reference range applies only to samples taken after fasting for at least 8 hours.   Comment 1 Notify RN   Prealbumin     Status: None   Collection Time: 01/01/23  1:43 AM  Result Value Ref Range   Prealbumin 28 18 - 38 mg/dL    Comment: Performed at [facility], [address], [place], [place] [address]  Ammonia  Status: None   Collection Time: 01/01/23  1:43 AM  Result Value Ref Range   Ammonia 17 9 - 35 umol/L    Comment: Performed at [facility], [address], [place], [place] [address]  Magnesium     Status: None   Collection Time: 01/01/23  1:43 AM  Result Value Ref Range   Magnesium 2.4 1.7 - 2.4 mg/dL    Comment: Performed at [facility], [address], [place], [place] [address]  Phosphorus     Status: None   Collection Time: 01/01/23  1:43 AM  Result Value Ref Range   Phosphorus 3.3 2.5 - 4.6 mg/dL    Comment: Performed at [facility], [address] [place], [place] [address]  Comprehensive metabolic panel     Status: Abnormal   Collection Time: 01/01/23  1:43 AM  Result Value Ref Range   Sodium 137 135 - 145 mmol/L   Potassium 5.1 3.5 - 5.1 mmol/L    Comment: HEMOLYSIS AT THIS LEVEL MAY AFFECT RESULT   Chloride 103 98 - 111 mmol/L   CO2 25 22 - 32 mmol/L    Glucose, Bld 81 70 - 99 mg/dL    Comment: Glucose reference range applies only to samples taken after fasting for at least 8 hours.   BUN 57 (H) 8 - 23 mg/dL   Creatinine, Ser 9.14 (H) 0.44 - 1.00 mg/dL   Calcium 78.2 (H) 8.9 - 10.3 mg/dL   Total Protein 7.5 6.5 - 8.1 g/dL   Albumin 3.7 3.5 - 5.0 g/dL   AST 19 15 - 41 U/L    Comment: HEMOLYSIS AT THIS LEVEL MAY AFFECT RESULT   ALT 9 0 - 44 U/L    Comment: HEMOLYSIS AT THIS LEVEL MAY AFFECT RESULT   Alkaline Phosphatase 61 38 - 126 U/L   Total Bilirubin 0.8 0.3 - 1.2 mg/dL    Comment: HEMOLYSIS AT THIS LEVEL MAY AFFECT RESULT   GFR, Estimated 14 (L) >60 mL/min    Comment: (NOTE) Calculated using the CKD-EPI Creatinine Equation (2021)    Anion gap 9 5 - 15    Comment: Performed at [facility], [address], [place], [place] [address]  CBC     Status: Abnormal   Collection Time: 01/01/23  3:57 AM  Result Value Ref Range   WBC 10.9 (H) 4.0 - 10.5 K/uL   RBC 4.14 3.87 - 5.11 MIL/uL   Hemoglobin 10.9 (L) 12.0 - 15.0 g/dL   HCT 30.8 (L) 65.7 - 84.6 %   MCV 86.0 80.0 - 100.0 fL   MCH 26.3 26.0 - 34.0 pg   MCHC 30.6 30.0 - 36.0 g/dL   RDW 96.2 95.2 - 84.1 %   Platelets 206 150 - 400 K/uL   nRBC 0.0 0.0 - 0.2 %    Comment: Performed at [facility], [address], [place], [place] [address]  CBG monitoring, ED     Status: None   Collection Time: 01/01/23  4:05 AM  Result Value Ref Range   Glucose-Capillary 72 70 - 99 mg/dL    Comment: Glucose reference range applies only to samples taken after fasting for at least 8 hours.   Comment 1 Notify RN    Comment 2 Call MD NNP PA CNM   CBG monitoring, ED     Status: Abnormal   Collection Time: 01/01/23  5:19 AM  Result Value Ref Range   Glucose-Capillary 130 (  H) 70 - 99 mg/dL    Comment: Glucose reference range applies only to samples taken after fasting for at least 8 hours.  CBG monitoring, ED     Status: None   Collection Time: 01/01/23  7:33 AM  Result Value Ref  Range   Glucose-Capillary 92 70 - 99 mg/dL    Comment: Glucose reference range applies only to samples taken after fasting for at least 8 hours.  CBG monitoring, ED     Status: None   Collection Time: 01/01/23 11:10 AM  Result Value Ref Range   Glucose-Capillary 87 70 - 99 mg/dL    Comment: Glucose reference range applies only to samples taken after fasting for at least 8 hours.   ECHOCARDIOGRAM COMPLETE  Result Date: 01/01/2023    ECHOCARDIOGRAM REPORT   Patient Name:   Samantha [name] Date of Exam: 01/01/2023 Medical Rec #:  [id number]       Height:       64.0 in Accession #:    [id number]      Weight:       196.0 lb Date of Birth:  Feb 27, 1946        BSA:          1.940 m Patient Age:    77 years        BP:           140/67 mmHg Patient Gender: F               HR:           81 bpm. Exam Location:  Inpatient Procedure: 2D Echo, Color Doppler and Cardiac Doppler Indications:    R06.9 DOE  History:        Patient has no prior history of Echocardiogram examinations.                 CHF; Risk Factors:Hypertension, Diabetes, Dyslipidemia, Sleep                 Apnea and CKD.  Sonographer:    Irving Burton Senior RDCS Referring Phys: Consepcion Hearing ANASTASSIA DOUTOVA IMPRESSIONS  1. Left ventricular ejection fraction, by estimation, is >75%. Left ventricular ejection fraction by PLAX is 73 %. The left ventricle has hyperdynamic function. The left ventricle has no regional wall motion abnormalities. There is moderate concentric left ventricular hypertrophy of the septal segment. Indeterminate diastolic filling due to E-A fusion.  2. Right ventricular systolic function is normal. The right ventricular size is normal. There is normal pulmonary artery systolic pressure. The estimated right ventricular systolic pressure is 29.0 mmHg.  3. The mitral valve is normal in structure. No evidence of mitral valve regurgitation. No evidence of mitral stenosis.  4. The aortic valve is tricuspid. Aortic valve regurgitation is not visualized. No  aortic stenosis is present.  5. The inferior vena cava is normal in size with greater than 50% respiratory variability, suggesting right atrial pressure of 3 mmHg. Comparison(s): No prior Echocardiogram. FINDINGS  Left Ventricle: Left ventricular ejection fraction, by estimation, is >75%. Left ventricular ejection fraction by PLAX is 73 %. The left ventricle has hyperdynamic function. The left ventricle has no regional wall motion abnormalities. The left ventricular internal cavity size was normal in size. There is moderate concentric left ventricular hypertrophy of the septal segment. Indeterminate diastolic filling due to E-A fusion. Right Ventricle: The right ventricular size is normal. Right ventricular systolic function is normal. There is normal pulmonary artery systolic pressure. The tricuspid regurgitant velocity is 2.55 m/s,  and with an assumed right atrial pressure of 3 mmHg,  the estimated right ventricular systolic pressure is 29.0 mmHg. Left Atrium: Left atrial size was normal in size. Right Atrium: Right atrial size was normal in size. Pericardium: Trivial pericardial effusion is present. Presence of epicardial fat layer. Mitral Valve: The mitral valve is normal in structure. No evidence of mitral valve regurgitation. No evidence of mitral valve stenosis. Tricuspid Valve: The tricuspid valve is normal in structure. Tricuspid valve regurgitation is mild . No evidence of tricuspid stenosis. Aortic Valve: The aortic valve is tricuspid. Aortic valve regurgitation is not visualized. No aortic stenosis is present. Pulmonic Valve: The pulmonic valve was normal in structure. Pulmonic valve regurgitation is not visualized. No evidence of pulmonic stenosis. Aorta: The aortic root and ascending aorta are structurally normal, with no evidence of dilitation. Venous: The inferior vena cava is normal in size with greater than 50% respiratory variability, suggesting right atrial pressure of 3 mmHg. IAS/Shunts: No  atrial level shunt detected by color flow Doppler.  LEFT VENTRICLE PLAX 2D LV EF:         Left ventricular ejection fraction by PLAX is 73 %. LVIDd:         2.70 cm LVIDs:         1.60 cm LV PW:         1.10 cm LV IVS:        1.40 cm LVOT diam:     1.90 cm LV SV:         58 LV SV Index:   30 LVOT Area:     2.84 cm  RIGHT VENTRICLE RV S prime:     8.92 cm/s TAPSE (M-mode): 2.0 cm LEFT ATRIUM             Index        RIGHT ATRIUM           Index LA diam:        2.20 cm 1.13 cm/m   RA Area:     16.10 cm LA Vol (A2C):   33.8 ml 17.43 ml/m  RA Volume:   37.60 ml  19.39 ml/m LA Vol (A4C):   36.7 ml 18.92 ml/m LA Biplane Vol: 36.9 ml 19.02 ml/m  AORTIC VALVE LVOT Vmax:   101.00 cm/s LVOT Vmean:  73.900 cm/s LVOT VTI:    0.205 m  AORTA Ao Root diam: 2.80 cm Ao Asc diam:  3.50 cm TRICUSPID VALVE TR Peak grad:   26.0 mmHg TR Vmax:        255.00 cm/s  SHUNTS Systemic VTI:  0.20 m Systemic Diam: 1.90 cm Olga Millers MD Electronically signed by Olga Millers MD Signature Date/Time: 01/01/2023/9:47:57 AM    Final    MR Cervical Spine Wo Contrast  Result Date: 12/31/2022 CLINICAL DATA:  Initial evaluation for acute trauma, fall. EXAM: MRI CERVICAL SPINE WITHOUT CONTRAST TECHNIQUE: Multiplanar, multisequence MR imaging of the cervical spine was performed. No intravenous contrast was administered. COMPARISON:  Prior CT from earlier the same day. FINDINGS: Alignment: Straightening with mild reversal of the normal cervical lordosis. Trace degenerative retrolisthesis of C5 on C6. Vertebrae: Vertebral body height maintained without acute or chronic fracture. Bone marrow signal intensity within normal limits. No discrete or worrisome osseous lesions. No abnormal marrow edema. Cord: Normal signal and morphology. No evidence for ligamentous injury. Posterior Fossa, vertebral arteries, paraspinal tissues: Craniocervical junction normal. Small focus of mild edema present within the posterior paraspinous musculature at the level  of C4-5 (series 8, image 8, 12, likely mild muscular injury/strain in the setting of trauma. Paraspinous soft tissues demonstrate no other acute finding. Normal flow voids seen within the vertebral arteries bilaterally. Disc levels: C2-C3: Mild disc bulge with uncovertebral hypertrophy. Mild right-sided facet hypertrophy. No significant spinal stenosis. Foramina remain adequately patent. C3-C4: Mild uncovertebral spurring without significant disc bulge. Mild facet hypertrophy, greater on the right. No spinal stenosis. Foramina remain patent. C4-C5: Degenerate intervertebral disc space narrowing with circumferential disc osteophyte complex. Posterior component flattens and partially effaces the ventral thecal sac. Mild cord flattening without cord signal changes. Mild spinal stenosis. Mild right with mild to moderate left C5 foraminal stenosis. C5-C6: Degenerative intervertebral disc space narrowing with circumferential disc osteophyte complex. Broad posterior component flattens and effaces the ventral thecal sac. Superimposed mild facet and ligament flavum hypertrophy. Mild cord flattening without cord signal changes. Moderate spinal stenosis with severe bilateral C6 foraminal narrowing. C6-C7: Mild disc bulge with uncovertebral spurring. Mild facet hypertrophy. No spinal stenosis. Foramina remain patent. C7-T1: Negative interspace. Mild facet hypertrophy. No spinal stenosis. Foramina remain patent. IMPRESSION: 1. Mild edema within the posterior paraspinous musculature at the level of C4-5, likely mild muscular injury/strain in the setting of trauma. 2. No other evidence for acute traumatic injury within the cervical spine. No evidence for traumatic cord or ligamentous injury. 3. Multilevel cervical spondylosis with resultant mild to moderate spinal stenosis at C4-5 and C5-6. Associated mild to moderate left C5 foraminal narrowing, with severe bilateral C6 foraminal stenosis. Electronically Signed   By: Rise Mu M.D.   On: 12/31/2022 21:51   MR BRAIN WO CONTRAST  Result Date: 12/31/2022 CLINICAL DATA:  Initial evaluation for mental status change, unknown cause. EXAM: MRI HEAD WITHOUT CONTRAST TECHNIQUE: Multiplanar, multiecho pulse sequences of the brain and surrounding structures were obtained without intravenous contrast. COMPARISON:  Prior CT from earlier the same day. FINDINGS: Brain: Diffuse prominence of the CSF containing spaces compatible generalized cerebral atrophy. Patchy T2/FLAIR hyperintensity involving the periventricular deep white matter, consistent with chronic small vessel ischemic disease, mild for age. Encephalomalacia and gliosis involving the right occipital lobe consistent with a chronic right PCA distribution infarct. No evidence for acute or subacute ischemia. Gray-white matter differentiation otherwise maintained. No other as a chronic cortical infarction. No acute intracranial hemorrhage. Single punctate chronic microhemorrhage noted at the left external capsule. No mass lesion, midline shift or mass effect. No hydrocephalus or extra-axial fluid collection. Pituitary gland suprasellar region within normal limits. Vascular: Major intracranial vascular flow voids are maintained. Skull and upper cervical spine: Craniocervical junction within normal limits. Bone marrow signal intensity normal. No scalp soft tissue abnormality. Sinuses/Orbits: Prior bilateral ocular lens replacement. Scattered mucosal thickening noted throughout the paranasal sinuses, most pronounced at the frontoethmoidal region. No mastoid effusion. Other: None. IMPRESSION: 1. No acute intracranial abnormality. 2. Chronic right PCA distribution infarct. 3. 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 Electronically Signed   By: Rise Mu M.D.   On: 12/31/2022 21:37   DG Shoulder Left  Result Date: 12/31/2022 CLINICAL DATA:  Status post fall, left shoulder pain  EXAM: LEFT SHOULDER - 2+ VIEW COMPARISON:  None Available. FINDINGS: No fracture or dislocation. Generalized osteopenia. Mild degenerative changes of the acromioclavicular joint. Soft tissues are normal. IMPRESSION: 1. No acute osseous injury of the left shoulder. Electronically Signed   By: Elige Ko M.D.   On: 12/31/2022 18:35   CT L-SPINE NO CHARGE  Result Date: 12/31/2022  CLINICAL DATA:  Fall. EXAM: CT THORACIC AND LUMBAR SPINE WITHOUT CONTRAST TECHNIQUE: Multi planar CT imaging of the thoracic and lumbar spine were reconstructed from CT chest, abdomen and pelvis without contrast. RADIATION DOSE REDUCTION: This exam was performed according to the departmental dose-optimization program which includes automated exposure control, adjustment of the mA and/or kV according to patient size and/or use of iterative reconstruction technique. COMPARISON:  None Available. FINDINGS: CT THORACIC SPINE FINDINGS Alignment: Normal. Vertebrae: Normal vertebral body heights. No acute fracture or suspicious bone lesions. Paraspinal and other soft tissues: Unremarkable. Disc levels: Mild degenerative changes of the midthoracic spine. No spinal canal stenosis or neural foraminal narrowing. CT LUMBAR SPINE FINDINGS Segmentation: Conventional numbering is assumed with 5 non-rib-bearing, lumbar type vertebral bodies. Alignment: Normal. Vertebrae: Normal vertebral body heights. No acute fracture or suspicious bone lesion. Multilevel degenerative endplate changes. Paraspinal and other soft tissues: Unremarkable. Disc levels: Multilevel lumbar spondylosis, worst at L3-4 and L4-5, where there is at least mild spinal canal stenosis. IMPRESSION: 1. No acute fracture or traumatic listhesis of the thoracic or lumbar spine. 2. Multilevel lumbar spondylosis, worst at L3-4 and L4-5, where there is at least mild spinal canal stenosis. Electronically Signed   By: Orvan Falconer M.D.   On: 12/31/2022 16:19   CT T-SPINE NO CHARGE  Result  Date: 12/31/2022 CLINICAL DATA:  Fall. EXAM: CT THORACIC AND LUMBAR SPINE WITHOUT CONTRAST TECHNIQUE: Multi planar CT imaging of the thoracic and lumbar spine were reconstructed from CT chest, abdomen and pelvis without contrast. RADIATION DOSE REDUCTION: This exam was performed according to the departmental dose-optimization program which includes automated exposure control, adjustment of the mA and/or kV according to patient size and/or use of iterative reconstruction technique. COMPARISON:  None Available. FINDINGS: CT THORACIC SPINE FINDINGS Alignment: Normal. Vertebrae: Normal vertebral body heights. No acute fracture or suspicious bone lesions. Paraspinal and other soft tissues: Unremarkable. Disc levels: Mild degenerative changes of the midthoracic spine. No spinal canal stenosis or neural foraminal narrowing. CT LUMBAR SPINE FINDINGS Segmentation: Conventional numbering is assumed with 5 non-rib-bearing, lumbar type vertebral bodies. Alignment: Normal. Vertebrae: Normal vertebral body heights. No acute fracture or suspicious bone lesion. Multilevel degenerative endplate changes. Paraspinal and other soft tissues: Unremarkable. Disc levels: Multilevel lumbar spondylosis, worst at L3-4 and L4-5, where there is at least mild spinal canal stenosis. IMPRESSION: 1. No acute fracture or traumatic listhesis of the thoracic or lumbar spine. 2. Multilevel lumbar spondylosis, worst at L3-4 and L4-5, where there is at least mild spinal canal stenosis. Electronically Signed   By: Orvan Falconer M.D.   On: 12/31/2022 16:19   CT HEAD WO CONTRAST  Result Date: 12/31/2022 CLINICAL DATA:  Head trauma, moderate-severe; Polytrauma, blunt. Fall. EXAM: CT HEAD WITHOUT CONTRAST CT CERVICAL SPINE WITHOUT CONTRAST CT CHEST, ABDOMEN AND PELVIS WITHOUT CONTRAST TECHNIQUE: Contiguous axial images were obtained from the base of the skull through the vertex without intravenous contrast. Multidetector CT imaging of the cervical spine  was performed without intravenous contrast. Multiplanar CT image reconstructions were also generated. Multidetector CT imaging of the chest, abdomen and pelvis was performed following the standard protocol without IV contrast. RADIATION DOSE REDUCTION: This exam was performed according to the departmental dose-optimization program which includes automated exposure control, adjustment of the mA and/or kV according to patient size and/or use of iterative reconstruction technique. COMPARISON:  Head CT 01/25/2019.  CT abdomen/pelvis 09/12/2015. FINDINGS: CT HEAD FINDINGS Brain: No acute hemorrhage. 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 Cortical gray-white differentiation  is otherwise preserved. Background of mild chronic small-vessel disease. No hydrocephalus or extra-axial collection. No mass effect or midline shift. Vascular: No hyperdense vessel or unexpected calcification. Skull: No calvarial fracture or suspicious bone lesion. Skull base is unremarkable. Sinuses/Orbits: Mild paranasal sinus disease. Orbits are unremarkable. Other: None. CT CERVICAL FINDINGS Alignment: Normal. Skull base and vertebrae: No acute fracture. Normal craniocervical junction. No suspicious bone lesions. Soft tissues and spinal canal: No prevertebral fluid or swelling. No visible canal hematoma. Disc levels: Mild cervical spondylosis without high-grade spinal canal stenosis. Other: Atherosclerotic calcifications of the carotid bulbs. CT CHEST FINDINGS Cardiovascular: No acute vascular findings. Normal heart size. No pericardial effusion. Severe coronary artery calcifications. Atherosclerotic calcifications of the thoracic aorta. Mediastinum/Nodes: No enlarged mediastinal, hilar, or axillary lymph nodes. Thyroid gland, trachea, and esophagus demonstrate no significant findings. Lungs/Pleura: Subsegmental atelectasis in the lung bases. No consolidation, pulmonary edema or traumatic findings. No pleural effusion or pneumothorax.  Musculoskeletal: No chest wall mass or suspicious bone lesions identified. CT ABDOMEN AND PELVIS FINDINGS Hepatobiliary: No hepatic injury or perihepatic hematoma. Gallbladder is unremarkable. Pancreas: Unremarkable. No pancreatic ductal dilatation or surrounding inflammatory changes. Spleen: No splenic injury or perisplenic hematoma. Adrenals/Urinary Tract: No adrenal hemorrhage or renal injury identified. Bladder is unremarkable. Stomach/Bowel: Stomach is within normal limits. Appendix appears normal. No evidence of bowel wall thickening, distention, or inflammatory changes. Vascular/Lymphatic: Aortic atherosclerosis. No enlarged abdominal or pelvic lymph nodes. Reproductive: Calcified uterine fibroids.  No adnexal masses. Other: No abdominal wall hernia or abnormality. No abdominopelvic ascites. Musculoskeletal: No fracture is seen. IMPRESSION: 1. No acute intracranial abnormality. 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 2. No acute fracture or traumatic listhesis of the cervical spine. 3. No acute traumatic findings in the chest, abdomen, or pelvis. 4. Coronary artery disease. Aortic Atherosclerosis (ICD10-I70.0). Electronically Signed   By: Orvan Falconer M.D.   On: 12/31/2022 16:14   CT CERVICAL SPINE WO CONTRAST  Result Date: 12/31/2022 CLINICAL DATA:  Head trauma, moderate-severe; Polytrauma, blunt. Fall. EXAM: CT HEAD WITHOUT CONTRAST CT CERVICAL SPINE WITHOUT CONTRAST CT CHEST, ABDOMEN AND PELVIS WITHOUT CONTRAST TECHNIQUE: Contiguous axial images were obtained from the base of the skull through the vertex without intravenous contrast. Multidetector CT imaging of the cervical spine was performed without intravenous contrast. Multiplanar CT image reconstructions were also generated. Multidetector CT imaging of the chest, abdomen and pelvis was performed following the standard protocol without IV contrast. RADIATION DOSE REDUCTION: This exam was performed according to the departmental dose-optimization  program which includes automated exposure control, adjustment of the mA and/or kV according to patient size and/or use of iterative reconstruction technique. COMPARISON:  Head CT 01/25/2019.  CT abdomen/pelvis 09/12/2015. FINDINGS: CT HEAD FINDINGS Brain: No acute hemorrhage. 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 Cortical gray-white differentiation is otherwise preserved. Background of mild chronic small-vessel disease. No hydrocephalus or extra-axial collection. No mass effect or midline shift. Vascular: No hyperdense vessel or unexpected calcification. Skull: No calvarial fracture or suspicious bone lesion. Skull base is unremarkable. Sinuses/Orbits: Mild paranasal sinus disease. Orbits are unremarkable. Other: None. CT CERVICAL FINDINGS Alignment: Normal. Skull base and vertebrae: No acute fracture. Normal craniocervical junction. No suspicious bone lesions. Soft tissues and spinal canal: No prevertebral fluid or swelling. No visible canal hematoma. Disc levels: Mild cervical spondylosis without high-grade spinal canal stenosis. Other: Atherosclerotic calcifications of the carotid bulbs. CT CHEST FINDINGS Cardiovascular: No acute vascular findings. Normal heart size. No pericardial effusion. Severe coronary artery calcifications. Atherosclerotic calcifications of the thoracic aorta.  Mediastinum/Nodes: No enlarged mediastinal, hilar, or axillary lymph nodes. Thyroid gland, trachea, and esophagus demonstrate no significant findings. Lungs/Pleura: Subsegmental atelectasis in the lung bases. No consolidation, pulmonary edema or traumatic findings. No pleural effusion or pneumothorax. Musculoskeletal: No chest wall mass or suspicious bone lesions identified. CT ABDOMEN AND PELVIS FINDINGS Hepatobiliary: No hepatic injury or perihepatic hematoma. Gallbladder is unremarkable. Pancreas: Unremarkable. No pancreatic ductal dilatation or surrounding inflammatory changes. Spleen: No splenic injury or perisplenic hematoma.  Adrenals/Urinary Tract: No adrenal hemorrhage or renal injury identified. Bladder is unremarkable. Stomach/Bowel: Stomach is within normal limits. Appendix appears normal. No evidence of bowel wall thickening, distention, or inflammatory changes. Vascular/Lymphatic: Aortic atherosclerosis. No enlarged abdominal or pelvic lymph nodes. Reproductive: Calcified uterine fibroids.  No adnexal masses. Other: No abdominal wall hernia or abnormality. No abdominopelvic ascites. Musculoskeletal: No fracture is seen. IMPRESSION: 1. No acute intracranial abnormality. 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 2. No acute fracture or traumatic listhesis of the cervical spine. 3. No acute traumatic findings in the chest, abdomen, or pelvis. 4. Coronary artery disease. Aortic Atherosclerosis (ICD10-I70.0). Electronically Signed   By: Orvan Falconer M.D.   On: 12/31/2022 16:14   CT CHEST ABDOMEN PELVIS WO CONTRAST  Result Date: 12/31/2022 CLINICAL DATA:  Head trauma, moderate-severe; Polytrauma, blunt. Fall. EXAM: CT HEAD WITHOUT CONTRAST CT CERVICAL SPINE WITHOUT CONTRAST CT CHEST, ABDOMEN AND PELVIS WITHOUT CONTRAST TECHNIQUE: Contiguous axial images were obtained from the base of the skull through the vertex without intravenous contrast. Multidetector CT imaging of the cervical spine was performed without intravenous contrast. Multiplanar CT image reconstructions were also generated. Multidetector CT imaging of the chest, abdomen and pelvis was performed following the standard protocol without IV contrast. RADIATION DOSE REDUCTION: This exam was performed according to the departmental dose-optimization program which includes automated exposure control, adjustment of the mA and/or kV according to patient size and/or use of iterative reconstruction technique. COMPARISON:  Head CT 01/25/2019.  CT abdomen/pelvis 09/12/2015. FINDINGS: CT HEAD FINDINGS Brain: No acute hemorrhage. 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 Cortical  gray-white differentiation is otherwise preserved. Background of mild chronic small-vessel disease. No hydrocephalus or extra-axial collection. No mass effect or midline shift. Vascular: No hyperdense vessel or unexpected calcification. Skull: No calvarial fracture or suspicious bone lesion. Skull base is unremarkable. Sinuses/Orbits: Mild paranasal sinus disease. Orbits are unremarkable. Other: None. CT CERVICAL FINDINGS Alignment: Normal. Skull base and vertebrae: No acute fracture. Normal craniocervical junction. No suspicious bone lesions. Soft tissues and spinal canal: No prevertebral fluid or swelling. No visible canal hematoma. Disc levels: Mild cervical spondylosis without high-grade spinal canal stenosis. Other: Atherosclerotic calcifications of the carotid bulbs. CT CHEST FINDINGS Cardiovascular: No acute vascular findings. Normal heart size. No pericardial effusion. Severe coronary artery calcifications. Atherosclerotic calcifications of the thoracic aorta. Mediastinum/Nodes: No enlarged mediastinal, hilar, or axillary lymph nodes. Thyroid gland, trachea, and esophagus demonstrate no significant findings. Lungs/Pleura: Subsegmental atelectasis in the lung bases. No consolidation, pulmonary edema or traumatic findings. No pleural effusion or pneumothorax. Musculoskeletal: No chest wall mass or suspicious bone lesions identified. CT ABDOMEN AND PELVIS FINDINGS Hepatobiliary: No hepatic injury or perihepatic hematoma. Gallbladder is unremarkable. Pancreas: Unremarkable. No pancreatic ductal dilatation or surrounding inflammatory changes. Spleen: No splenic injury or perisplenic hematoma. Adrenals/Urinary Tract: No adrenal hemorrhage or renal injury identified. Bladder is unremarkable. Stomach/Bowel: Stomach is within normal limits. Appendix appears normal. No evidence of bowel wall thickening, distention, or inflammatory changes. Vascular/Lymphatic: Aortic atherosclerosis. No enlarged abdominal or pelvic  lymph nodes. Reproductive:  Calcified uterine fibroids.  No adnexal masses. Other: No abdominal wall hernia or abnormality. No abdominopelvic ascites. Musculoskeletal: No fracture is seen. IMPRESSION: 1. No acute intracranial abnormality. 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 2. No acute fracture or traumatic listhesis of the cervical spine. 3. No acute traumatic findings in the chest, abdomen, or pelvis. 4. Coronary artery disease. Aortic Atherosclerosis (ICD10-I70.0). Electronically Signed   By: Orvan Falconer M.D.   On: 12/31/2022 16:14   DG Elbow Complete Left  Result Date: 12/31/2022 CLINICAL DATA:  Status post fall, left elbow pain EXAM: LEFT ELBOW - COMPLETE 3+ VIEW COMPARISON:  None Available. FINDINGS: There is no evidence of fracture, dislocation, or joint effusion. There is no evidence of arthropathy or other focal bone abnormality. Soft tissues are unremarkable. IMPRESSION: Negative. Electronically Signed   By: Elige Ko M.D.   On: 12/31/2022 15:05   DG Pelvis Portable  Result Date: 12/31/2022 CLINICAL DATA:  Status post fall, trauma EXAM: PORTABLE PELVIS 1-2 VIEWS COMPARISON:  None Available. FINDINGS: No acute fracture or dislocation. No aggressive osseous lesion. Normal alignment. Generalized osteopenia. Lower lumbar spine spondylosis. Calcified right uterine fibroid. Peripheral vascular atherosclerotic disease. No radiopaque foreign body or soft tissue emphysema. IMPRESSION: 1. No acute osseous injury of the pelvis. If there is further clinical concern, recommend a CT of the pelvis. Electronically Signed   By: Elige Ko M.D.   On: 12/31/2022 15:04   DG Chest Port 1 View  Result Date: 12/31/2022 CLINICAL DATA:  Status post fall, trauma EXAM: PORTABLE CHEST 1 VIEW COMPARISON:  06/04/2020 FINDINGS: Mild lingular scarring versus atelectasis. No focal consolidation. No pleural effusion or pneumothorax. Heart and mediastinal contours are unremarkable. No acute osseous abnormality.  IMPRESSION: No active disease. Electronically Signed   By: Elige Ko M.D.   On: 12/31/2022 15:02    Pending Labs Unresulted Labs (From admission, onward)     Start     Ordered   01/02/23 0500  Basic metabolic panel  Daily,   R      01/01/23 1302   01/02/23 0500  CBC  Daily,   R      01/01/23 1302   01/01/23 1302  Brain natriuretic peptide  Add-on,   AD        01/01/23 1302   12/31/22 2138  Urine Culture (for pregnant, neutropenic or urologic patients or patients with an indwelling urinary catheter)  (Urine Labs)  Once,   URGENT       Question:  Indication  Answer:  Altered mental status (if no other cause identified)   12/31/22 2137            Vitals/Pain Today's Vitals   01/01/23 1215 01/01/23 1230 01/01/23 1245 01/01/23 1315  BP: (!) 128/94 (!) 166/67 105/89 (!) 153/116  Pulse: 90 89 89 91  Resp: 16 18 11 14   Temp:      TempSrc:      SpO2: 98% 98% 99% 98%  Weight:      Height:      PainSc:        Isolation Precautions Airborne and Contact precautions  Medications Medications  amLODipine (NORVASC) tablet 10 mg (10 mg Oral Given 01/01/23 1003)  aspirin EC tablet 81 mg (81 mg Oral Given 01/01/23 1003)  divalproex (DEPAKOTE) DR tablet 125 mg (125 mg Oral Given 01/01/23 1004)  tamsulosin (FLOMAX) capsule 0.4 mg (0.4 mg Oral Given 01/01/23 1003)  0.9 %  sodium chloride infusion ( Intravenous New Bag/Given 01/01/23 0140)  acetaminophen (TYLENOL) tablet 650 mg (has no administration in time range)    Or  acetaminophen (TYLENOL) suppository 650 mg (has no administration in time range)  ondansetron (ZOFRAN) tablet 4 mg (has no administration in time range)    Or  ondansetron (ZOFRAN) injection 4 mg (has no administration in time range)  latanoprost (XALATAN) 0.005 % ophthalmic solution 1 drop (1 drop Both Eyes Not Given 01/01/23 0346)  pantoprazole (PROTONIX) EC tablet 40 mg (40 mg Oral Given 01/01/23 1003)  traZODone (DESYREL) tablet 50 mg (50 mg Oral Given 01/01/23 0141)   pregabalin (LYRICA) capsule 50 mg (50 mg Oral Given 01/01/23 1003)  hydrALAZINE (APRESOLINE) tablet 100 mg (100 mg Oral Given 01/01/23 1014)  meropenem (MERREM) 500 mg in sodium chloride 0.9 % 100 mL IVPB (0 mg Intravenous Stopped 01/01/23 1328)  atorvastatin (LIPITOR) tablet 20 mg (has no administration in time range)  albuterol (PROVENTIL) (2.5 MG/3ML) 0.083% nebulizer solution 3 mL (has no administration in time range)  haloperidol (HALDOL) tablet 0.5 mg (0.5 mg Oral Given 01/01/23 0711)    And  haloperidol (HALDOL) tablet 1 mg (has no administration in time range)  dextrose 50 % solution 50 mL (50 mLs Intravenous Given 01/01/23 0411)  perflutren lipid microspheres (DEFINITY) IV suspension (3 mLs Intravenous Given 01/01/23 0908)  0.9 %  sodium chloride infusion (has no administration in time range)  lactated ringers bolus 500 mL (0 mLs Intravenous Stopped 01/01/23 0019)  fluconazole (DIFLUCAN) tablet 50 mg (50 mg Oral Given 12/31/22 2043)  acetaminophen (TYLENOL) tablet 650 mg (650 mg Oral Given 12/31/22 1953)  cefTRIAXone (ROCEPHIN) 1 g in sodium chloride 0.9 % 100 mL IVPB (0 g Intravenous Stopped 12/31/22 2038)  sodium chloride 0.9 % bolus 500 mL (0 mLs Intravenous Stopped 12/31/22 2146)  meropenem (MERREM) 1 g in sodium chloride 0.9 % 100 mL IVPB (0 g Intravenous Stopped 12/31/22 2350)    Mobility walks with device     Focused Assessments Renal Assessment Handoff:  Hemodialysis Schedule:  Last Hemodialysis date and time:   Restricted appendage:    R Recommendations: See Admitting Provider Note  Report given to:   Additional Notes: PT worked with her and we were only able to get her to sit up on the side of the bed

## 2023-01-01 NOTE — Evaluation (Signed)
Physical Therapy Evaluation Patient Details Name: Samantha [name] MRN: [id number] DOB: 10-Jan-1946 Today's Date: 01/01/2023  History of Present Illness  Pt is a 77 y/o female admitted 8/18 after fall unwitnessed fall OOB associated with some confusion  .  PMHx:  bipolar, CKDj, CHF, HTN, DM2, CVA, mobitz type 1 heart block  Clinical Impression  Pt admitted with/for unwitnessed fall/confusion.  Pt is possibly at her baseline function, but unsure.  Pt needing mod assist to transition to EOB, but pt declined further movement into standing or transfers today.  Pt currently limited functionally due to the problems listed. ( See problems list.)   Pt will benefit from PT to maximize function and safety in order to get ready for next venue listed below.         If plan is discharge home, recommend the following: A lot of help with walking and/or transfers;A lot of help with bathing/dressing/bathroom;Assistance with cooking/housework;Direct supervision/assist for medications management;Direct supervision/assist for financial management;Assist for transportation   Can travel by private vehicle   No    Equipment Recommendations None recommended by PT;Other (comment) (TBD)  Recommendations for Other Services       Functional Status Assessment Patient has had a recent decline in their functional status and demonstrates the ability to make significant improvements in function in a reasonable and predictable amount of time.     Precautions / Restrictions Precautions Precautions: Fall      Mobility  Bed Mobility Overal bed mobility: Needs Assistance Bed Mobility: Supine to Sit, Sit to Supine     Supine to sit: Mod assist Sit to supine: Mod assist   General bed mobility comments: pt cued for technique to best use UE's and assist throughout the transition to/from sitting EOB    Transfers                   General transfer comment: NT, pt declined    Ambulation/Gait                   Stairs            Wheelchair Mobility     Tilt Bed    Modified Rankin (Stroke Patients Only)       Balance Overall balance assessment: Needs assistance Sitting-balance support: Single extremity supported, Bilateral upper extremity supported, Feet supported Sitting balance-Leahy Scale: Poor Sitting balance - Comments: reliant on UE assist to maintain sitting without external assist.  pt fearful of movements that disrupt her attempts at balance.                                     Pertinent Vitals/Pain Pain Assessment Pain Assessment: Faces Faces Pain Scale: Hurts little more Pain Location: R arm/thumb,  back Pain Descriptors / Indicators: Aching, Sore Pain Intervention(s): Monitored during session, Limited activity within patient's tolerance    Home Living Family/patient expects to be discharged to:: Skilled nursing facility                   Additional Comments:  (lives at [facility], assisted by staff)    Prior Function Prior Level of Function : Needs assist             Mobility Comments: pt reports getting up to a w/c most days,  no coroborration available. ADLs Comments: pt assist with ADL's     Extremity/Trunk Assessment   Upper Extremity Assessment Upper Extremity  Assessment: Generalized weakness (with mild pain R>L)    Lower Extremity Assessment Lower Extremity Assessment: Generalized weakness    Cervical / Trunk Assessment Cervical / Trunk Assessment: Kyphotic  Communication   Communication Communication: No apparent difficulties  Cognition Arousal: Alert Behavior During Therapy: Flat affect Overall Cognitive Status:  (NT formally, not sure of pt's ability to relate her PLOF,)                                          General Comments      Exercises     Assessment/Plan    PT Assessment Patient needs continued PT services  PT Problem List Decreased strength;Decreased activity  tolerance;Decreased balance;Decreased mobility;Decreased coordination;Decreased cognition;Pain       PT Treatment Interventions Functional mobility training;Therapeutic activities;Therapeutic exercise;Balance training;Patient/family education    PT Goals (Current goals can be found in the Care Plan section)  Acute Rehab PT Goals Patient Stated Goal: pt agreed she wanted to be able to get OOB ith help daily PT Goal Formulation: With patient Time For Goal Achievement: 01/08/23 Potential to Achieve Goals: Fair    Frequency Min 1X/week     Co-evaluation               AM-PAC PT "6 Clicks" Mobility  Outcome Measure Help needed turning from your back to your side while in a flat bed without using bedrails?: A Lot Help needed moving from lying on your back to sitting on the side of a flat bed without using bedrails?: A Lot Help needed moving to and from a bed to a chair (including a wheelchair)?: Total Help needed standing up from a chair using your arms (e.g., wheelchair or bedside chair)?: Total Help needed to walk in hospital room?: Total Help needed climbing 3-5 steps with a railing? : Total 6 Click Score: 8    End of Session   Activity Tolerance: Patient tolerated treatment well;Patient limited by lethargy Patient left: in bed;with call bell/phone within reach Nurse Communication: Mobility status PT Visit Diagnosis: Muscle weakness (generalized) (M62.81);Other abnormalities of gait and mobility (R26.89)    Time: 2956-2130 PT Time Calculation (min) (ACUTE ONLY): 26 min   Charges:   PT Evaluation $PT Eval Moderate Complexity: 1 Mod PT Treatments $Therapeutic Activity: 8-22 mins PT General Charges $$ ACUTE PT VISIT: 1 Visit         01/01/2023  Jacinto Halim., PT Acute Rehabilitation Services [phone]  (office)  Eliseo Gum Bynum Mccullars 01/01/2023, 12:35 PM

## 2023-01-01 NOTE — Progress Notes (Signed)
Received fax from [facility] and [facility] with urine culture results. Will keep bedside with other patient-related papers until able to put in shadow chart.  Urine culture collected 12/12/22 @0600   Organism: Escherichia coli  Sensitivity: - Ampicillin (MIC > 32) - Resistant - Ampicillin/sulbactam (MIC 4) - Sensitive - Cefazolin (MIC > 64) - Resistant - Cefepime (MIC < 1) - Resistant - Ceftazidime (MIC 4) - Resistant - Ceftriaxone (MIC 16) - Resistant - Ciprofloxacin (MIC > 4) - Resistant - Ertapenem (MIC < 0.5) - Sensitive - Gentamicin (MIC < 1) - Sensitive - Imipenem (MIC < 0.25) - Sensitive - Levofloxacin (MIC > 8) - Resistant - Nitrofurantoin (MIC < 16) - Sensitive - Piperacillin/tazobactam (MIC < 4) - Sensitive - Tobramycin (MIC < 1) - Sensitive - Trimethoprim/sulfamethoxazole (MIC > 320) - Resistant  Eldridge Scot, PharmD, BCCCP Clinical Pharmacist 01/01/2023, 12:14 PM

## 2023-01-01 NOTE — Plan of Care (Signed)

## 2023-01-01 NOTE — ED Notes (Signed)
Pt was able to sit up on the side of the bed with PT and myself at bedside.

## 2023-01-01 NOTE — ED Notes (Signed)
Pt unable to ambulate at this time even with PT at the bedside

## 2023-01-01 NOTE — Consult Note (Signed)
Consultation Note Date: 01/01/2023   Patient Name: Samantha [name]  DOB: [birth year]  MRN: [id number]  Age / Sex: 77 y.o., female  PCP: Renford Dills, MD Referring Physician: Lanae Boast, MD  Reason for Consultation: Establishing goals of care  HPI/Patient Profile: 77 y.o. female  with past medical history of of bipolar, CKD stage 4, CHF, HTN, DM2, CVA, hypercalcemia, obesity , GERD, neuropathy, Mobitz type I heart block admitted on 12/31/2022 with fall and confusion at [facility].   Patient was recently diagnosed with UTI at SNF and now admitted for acute metabolic encephalopathy, AKI on CKD 4/5. PMT has been consulted to assist with goals of care conversation, CODE STATUS.  Clinical Assessment and Goals of Care:  I have reviewed medical records including EPIC notes, labs and imaging, assessed the patient and then had a phone conversation with patient's daughter [name] to discuss diagnosis prognosis, GOC, EOL wishes, disposition and options.  I introduced Palliative Medicine as specialized medical care for people living with serious illness. It focuses on providing relief from the symptoms and stress of a serious illness. The goal is to improve quality of life for both the patient and the family.  We discussed a brief life review of the patient and then focused on their current illness.   I attempted to elicit values and goals of care important to the patient.    Medical History Review and Understanding:  Patient states that "they have told me nothing" about why she is here with her current care plan.  She does know is something related to kidney disease.  I reviewed her acute illness and provided update which she states she understands.  Spoke with patient's daughter on the phone and she reports a good understanding of the severity of patient's illness.  Social History: Patient reports that she is in her second year of  long-term care at [facility].  She states she has 2 daughters, 1 of which lives in [place] and will be arriving on Friday.  She also has 1 son who typically does not come to see her.  She greatly enjoys playing bingo.  She is a Curator.  She is divorced.  She reports having a good support system.  Functional and Nutritional State: Patient is bedbound at baseline.  Albumin of 3.7.  Palliative Symptoms: Mild dyspnea, anxiety  Advance Directives: A detailed discussion regarding advanced directives was had.  Patient's daughter is hopeful that patient will be willing to complete this during admission, as this has been something she has been interested in for a while. She wishes to be present for support during the AD discussion.  Code Status: Concepts specific to code status, artifical feeding and hydration, and rehospitalization were considered and discussed.  Patient states that she would want to be resuscitated at this time.  Discussion: Patient shares that she has not really considering her goals or hopes for the future, though she acknowledges that she should be.  She finds her current quality of life to be acceptable thanks to her strong support system.  It would be unacceptable to her if people were "talking about me, not to me."  We discussed the importance of her independence and ability to participate in her care and decision making.  She states that has been reflecting on her past, such as her previous marriage.  We discussed her care preferences and her current desire for full aggressive care.  She states she might even try dialysis one day if it was offered and indicated.  She is not ready to die.  I then called patient's daughter and provided her updates on the above conversations.  She is surprised, as patient has reportedly been been more coherent for the past few months and repeatedly stating that "she is ready" for when her time comes. Selita feels that patient's quality of life  is poor given her bedbound status, though she is supportive of what ever her mother wants to do.  We discussed that often patients can have a severe acute illness and hospitalization that creates more fear and hesitancy to adhere to long held care preferences.  We discussed the benefits of outpatient palliative care support in these situations, for further discussion when patient returns to her familiar environment and transition to hospice care if patient/family desire.  She agrees that this would be appropriate.   The difference between aggressive medical intervention and comfort care was considered in light of the patient's goals of care. Hospice and Palliative Care services outpatient were explained and offered.   Discussed the importance of continued conversation with family and the medical providers regarding overall plan of care and treatment options, ensuring decisions are within the context of the patient's values and GOCs.   Questions and concerns were addressed.  Hard Choices booklet left for review. The family was encouraged to call with questions or concerns.  PMT will continue to support holistically.   SUMMARY OF RECOMMENDATIONS   -Continue full code/full scope treatment -Patient states that she is not ready to die and she is open to aggressive interventions, while her daughter states that this is inconsistent with her previous statements over the past few months -Ongoing goals of care discussions -Spiritual care consult for advanced directives -Psychosocial and emotional support provided -PMT will continue to follow and support  Prognosis:  Poor long-term prognosis  Discharge Planning: SNF with palliative care follow-up     Primary Diagnoses: Present on Admission:  Acute encephalopathy  Bipolar 1 disorder, depressed (HCC)  Acute renal failure superimposed on stage 3 chronic kidney disease (HCC)  AV block, Mobitz 1  Hypertension associated with stage 3 chronic kidney  disease due to type 2 diabetes mellitus (HCC)  Sleep apnea, unspecified  Type 2 diabetes mellitus with diabetic chronic kidney disease (HCC)  UTI (urinary tract infection)  Hyperlipidemia  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)    Physical Exam Vitals and nursing note reviewed.  Constitutional:      General: She is not in acute distress.    Appearance: She is ill-appearing.  Cardiovascular:     Rate and Rhythm: Normal rate.  Pulmonary:     Effort: Pulmonary effort is normal.  Neurological:     Mental Status: She is alert and oriented to person, place, and time.  Psychiatric:        Mood and Affect: Mood normal.        Behavior: Behavior normal.     Vital Signs: BP (!) 155/78   Pulse 82   Temp 97.6 F (36.4 C) (Oral)   Resp 10   Ht 5\' 4"  (1.626 m)   Wt 88.9 kg   SpO2 97%   BMI 33.64 kg/m  Pain Scale: PAINAD   Pain Score: Asleep   SpO2: SpO2: 97 % O2 Device:SpO2: 97 % O2 Flow Rate: .    Palliative Assessment/Data: 30%     MDM: High   Ade Stmarie Jeni Salles, PA-C  Palliative Medicine Team Team phone # [phone]  Thank you for allowing the  Palliative Medicine Team to assist in the care of this patient. Please utilize secure chat with additional questions, if there is no response within 30 minutes please call the above phone number.  Palliative Medicine Team providers are available by phone from 7am to 7pm daily and can be reached through the team cell phone.  Should this patient require assistance outside of these hours, please call the patient's attending physician.

## 2023-01-01 NOTE — Progress Notes (Signed)
Patient arrived to [phone] with daughter at bedside. No belongings at bedside.

## 2023-01-01 NOTE — ED Notes (Signed)
Pt cbg 72mg /dl, 1 amp Z61 given.Dr Tereasa Coop MD notified, new orders placed

## 2023-01-02 ENCOUNTER — Inpatient Hospital Stay (HOSPITAL_COMMUNITY): Payer: Medicare Other

## 2023-01-02 DIAGNOSIS — R569 Unspecified convulsions: Secondary | ICD-10-CM

## 2023-01-02 DIAGNOSIS — G9340 Encephalopathy, unspecified: Secondary | ICD-10-CM | POA: Diagnosis not present

## 2023-01-02 DIAGNOSIS — Z7189 Other specified counseling: Secondary | ICD-10-CM | POA: Diagnosis not present

## 2023-01-02 DIAGNOSIS — N179 Acute kidney failure, unspecified: Secondary | ICD-10-CM | POA: Diagnosis not present

## 2023-01-02 DIAGNOSIS — E1122 Type 2 diabetes mellitus with diabetic chronic kidney disease: Secondary | ICD-10-CM | POA: Diagnosis not present

## 2023-01-02 DIAGNOSIS — N3090 Cystitis, unspecified without hematuria: Secondary | ICD-10-CM

## 2023-01-02 DIAGNOSIS — N183 Chronic kidney disease, stage 3 unspecified: Secondary | ICD-10-CM

## 2023-01-02 LAB — BASIC METABOLIC PANEL
Anion gap: 11 (ref 5–15)
BUN: 49 mg/dL — ABNORMAL HIGH (ref 8–23)
CO2: 22 mmol/L (ref 22–32)
Calcium: 10.6 mg/dL — ABNORMAL HIGH (ref 8.9–10.3)
Chloride: 103 mmol/L (ref 98–111)
Creatinine, Ser: 2.51 mg/dL — ABNORMAL HIGH (ref 0.44–1.00)
GFR, Estimated: 19 mL/min — ABNORMAL LOW (ref >60)
Glucose, Bld: 93 mg/dL (ref 70–99)
Potassium: 4.4 mmol/L (ref 3.5–5.1)
Sodium: 136 mmol/L (ref 135–145)

## 2023-01-02 LAB — CBC
HCT: 33.1 % — ABNORMAL LOW (ref 36.0–46.0)
Hemoglobin: 10.3 g/dL — ABNORMAL LOW (ref 12.0–15.0)
MCH: 26.6 pg (ref 26.0–34.0)
MCHC: 31.1 g/dL (ref 30.0–36.0)
MCV: 85.5 fL (ref 80.0–100.0)
Platelets: 196 10*3/uL (ref 150–400)
RBC: 3.87 MIL/uL (ref 3.87–5.11)
RDW: 14.5 % (ref 11.5–15.5)
WBC: 9.8 10*3/uL (ref 4.0–10.5)
nRBC: 0 % (ref 0.0–0.2)

## 2023-01-02 LAB — GLUCOSE, CAPILLARY
Glucose-Capillary: 103 mg/dL — ABNORMAL HIGH (ref 70–99)
Glucose-Capillary: 114 mg/dL — ABNORMAL HIGH (ref 70–99)
Glucose-Capillary: 142 mg/dL — ABNORMAL HIGH (ref 70–99)
Glucose-Capillary: 94 mg/dL (ref 70–99)
Glucose-Capillary: 98 mg/dL (ref 70–99)

## 2023-01-02 MED ORDER — HALOPERIDOL 0.5 MG PO TABS
0.5000 mg | ORAL_TABLET | Freq: Every morning | ORAL | Status: DC
  Administered 2023-01-04: 0.5 mg via ORAL
  Filled 2023-01-02 (×3): qty 1

## 2023-01-02 MED ORDER — HALOPERIDOL 0.5 MG PO TABS
0.5000 mg | ORAL_TABLET | Freq: Every day | ORAL | Status: DC
  Administered 2023-01-02 – 2023-01-03 (×2): 0.5 mg via ORAL
  Filled 2023-01-02 (×3): qty 1

## 2023-01-02 MED ORDER — RENA-VITE PO TABS
1.0000 | ORAL_TABLET | Freq: Every day | ORAL | Status: DC
  Administered 2023-01-02 – 2023-01-03 (×2): 1 via ORAL
  Filled 2023-01-02 (×2): qty 1

## 2023-01-02 NOTE — Evaluation (Signed)
Occupational Therapy Evaluation Patient Details Name: Samantha [name] MRN: [id number] DOB: 1945-09-20 Today's Date: 01/02/2023   History of Present Illness Pt is a 77 y/o female admitted 8/18 after fall unwitnessed fall OOB associated with some confusion  .  PMHx:  bipolar, CKDj, CHF, HTN, DM2, CVA, mobitz type 1 heart block   Clinical Impression   Pt s/p above diagnosis. Pt lethargic today, c/o feeling cold and shaking, limiting participation in mobility. Pt from SNF, plans to return to SNF. Pt A/Ox1 upon entry, states she was at a hotel. Pt fitted for CPAP and wore during session, became more alert, A/Ox4. Pt displays significant BUE weakness, limiting participation in ADLs, weak grip, decreased shoulder ROM, general weakness, decreased FM. Pt HR spiked to 200 briefly, ECG leads checked and nursing informed. Pt would benefit from continued skilled therapy to maximize participation in ADLs and functional strength, return to SNF appropriate.      If plan is discharge home, recommend the following: A lot of help with walking and/or transfers;A lot of help with bathing/dressing/bathroom;Assistance with cooking/housework;Direct supervision/assist for medications management;Assist for transportation    Functional Status Assessment  Patient has had a recent decline in their functional status and demonstrates the ability to make significant improvements in function in a reasonable and predictable amount of time.  Equipment Recommendations  Other (comment) (defer)    Recommendations for Other Services       Precautions / Restrictions Precautions Precautions: Fall Restrictions Weight Bearing Restrictions: No      Mobility Bed Mobility Overal bed mobility: Needs Assistance             General bed mobility comments: refused bed mobility    Transfers                   General transfer comment: declined      Balance Overall balance assessment: Needs assistance                                          ADL either performed or assessed with clinical judgement   ADL Overall ADL's : Needs assistance/impaired                                       General ADL Comments: Pt difficult to assess, lethargic, refused bed mobility, EOB activities. Pt stated she was cold, shaking, significantly weak grip, decreased FM, not able to raise arms fully above head, likely will need significant assistance for all ADLs.     Vision         Perception         Praxis         Pertinent Vitals/Pain Pain Assessment Pain Assessment: No/denies pain     Extremity/Trunk Assessment Upper Extremity Assessment Upper Extremity Assessment: Generalized weakness   Lower Extremity Assessment Lower Extremity Assessment: Defer to PT evaluation       Communication Communication Communication: No apparent difficulties   Cognition Arousal: Lethargic Behavior During Therapy: Flat affect Overall Cognitive Status: No family/caregiver present to determine baseline cognitive functioning                                 General Comments: Pt A/Ox1 upon arrival, confused, not able to answer  questions. She was fitted for CPAP and within a few minutes she was A/Ox4.     General Comments  Pt shaking, states she is cold, HR spiked briefly during session, nursing informed    Exercises     Shoulder Instructions      Home Living Family/patient expects to be discharged to:: Skilled nursing facility                                        Prior Functioning/Environment Prior Level of Function : Needs assist             Mobility Comments: pt reports getting up to a w/c most days,  no coroborration available. ADLs Comments: pt assist with ADL's        OT Problem List: Decreased strength;Decreased range of motion;Decreased activity tolerance;Impaired balance (sitting and/or standing);Decreased coordination;Decreased  cognition;Impaired UE functional use      OT Treatment/Interventions: Self-care/ADL training;Therapeutic exercise;Energy conservation;DME and/or AE instruction;Therapeutic activities    OT Goals(Current goals can be found in the care plan section) Acute Rehab OT Goals Patient Stated Goal: to return to nursing home OT Goal Formulation: With patient Time For Goal Achievement: 01/16/23 Potential to Achieve Goals: Fair  OT Frequency: Min 1X/week    Co-evaluation              AM-PAC OT "6 Clicks" Daily Activity     Outcome Measure Help from another person eating meals?: A Little Help from another person taking care of personal grooming?: A Lot Help from another person toileting, which includes using toliet, bedpan, or urinal?: Total Help from another person bathing (including washing, rinsing, drying)?: A Lot Help from another person to put on and taking off regular upper body clothing?: A Lot Help from another person to put on and taking off regular lower body clothing?: A Lot 6 Click Score: 12   End of Session Nurse Communication: Mobility status  Activity Tolerance: Patient limited by lethargy Patient left: in bed;with call bell/phone within reach;with bed alarm set;with nursing/sitter in room  OT Visit Diagnosis: Unsteadiness on feet (R26.81);Other abnormalities of gait and mobility (R26.89);History of falling (Z91.81);Muscle weakness (generalized) (M62.81);Other symptoms and signs involving cognitive function                Time: 1040-1104 OT Time Calculation (min): 24 min Charges:  OT General Charges $OT Visit: 1 Visit OT Evaluation $OT Eval Moderate Complexity: 1 Mod OT Treatments $Self Care/Home Management : 8-22 mins  [address], OTR/L   Alexis Goodell 01/02/2023, 11:14 AM

## 2023-01-02 NOTE — Procedures (Signed)
Patient Name: Samantha [name]  MRN: [id number]  Epilepsy Attending: Charlsie Quest  Referring Physician/Provider: Maretta Bees, MD  Date: 01/02/2023 Duration: 24.50 mins  Patient history: 77yo F with ams getting eeg to evaluate for seizure.  Level of alertness: Awake, asleep  AEDs during EEG study: VPA  Technical aspects: This EEG study was done with scalp electrodes positioned according to the 10-20 International system of electrode placement. Electrical activity was reviewed with band pass filter of 1-70Hz , sensitivity of 7 uV/mm, display speed of 89mm/sec with a 60Hz  notched filter applied as appropriate. EEG data were recorded continuously and digitally stored.  Video monitoring was available and reviewed as appropriate.  Description: No posterior dominant rhythm was seen. Sleep was characterized by vertex waves, sleep spindles (12 to 14 Hz), maximal frontocentral region. EEG showed continuous generalized 3 to 5 Hz theta-delta slowing. Hyperventilation and photic stimulation were not performed.     ABNORMALITY - Continuous slow, generalized  IMPRESSION: This study is suggestive of moderate diffuse encephalopathy, nonspecific etiology. No seizures or epileptiform discharges were seen throughout the recording.  [name]

## 2023-01-02 NOTE — Progress Notes (Signed)
Initial Nutrition Assessment  DOCUMENTATION CODES:   Not applicable  INTERVENTION:  Renal Multivitamin w/ minerals daily Liberalize pt diet to regular due to increased needs Continue 1800 mL fluid restriction  NUTRITION DIAGNOSIS:   Increased nutrient needs related to chronic illness as evidenced by estimated needs  GOAL:   Patient will meet greater than or equal to 90% of their needs  MONITOR:   PO intake, Labs, Weight trends, I & O's  REASON FOR ASSESSMENT:   Consult Assessment of nutrition requirement/status  ASSESSMENT:   77 y.o. female presented to the ED with fall and confusion. PMH includes CKD IV, CHF, HTN, T2DM, GERD, bipolar and CVA. Pt admitted with acute encephalopathy, UTI, and AKI on CKD.   Pt sleeping at time of RD visit, woke to name and responded "hello" then fell back asleep. No family at bedside.  Discussed with RN, reports that pt has been this way all morning. RD observed pt breakfast tray in room, tray untouched. Limited weight history in chart.   Medications reviewed and include: Protonix Labs reviewed: Sodium 136, Potassium 4.4, BUN 49, Creatinine 2.51, Hgb A1c 6.5 CBG:  87-150 x 24 hrs   NUTRITION - FOCUSED PHYSICAL EXAM:  Flowsheet Row Most Recent Value  Orbital Region No depletion  Upper Arm Region No depletion  Thoracic and Lumbar Region No depletion  Buccal Region No depletion  Temple Region No depletion  Clavicle Bone Region No depletion  Clavicle and Acromion Bone Region No depletion  Scapular Bone Region No depletion  Dorsal Hand No depletion  Patellar Region Mild depletion  Anterior Thigh Region Mild depletion  Posterior Calf Region Mild depletion  Edema (RD Assessment) None  Hair Reviewed  Eyes Unable to assess  Mouth Unable to assess  Skin Reviewed  Nails Reviewed   Diet Order:   Diet Order             Diet regular Room service appropriate? Yes with Assist; Fluid consistency: Thin; Fluid restriction: 1800 mL Fluid   Diet effective now                   EDUCATION NEEDS:   Not appropriate for education at this time  Skin:  Skin Assessment: Reviewed RN Assessment  Last BM:  Unknown/PTA  Height:   Ht Readings from Last 1 Encounters:  12/31/22 5\' 4"  (1.626 m)    Weight:   Wt Readings from Last 1 Encounters:  12/31/22 88.9 kg    Ideal Body Weight:  54.6 kg  BMI:  Body mass index is 33.64 kg/m.  Estimated Nutritional Needs:  Kcal:  1800-2000 Protein:  90-110 grams Fluid:  >/= 1.8 L   Kirby Crigler RD, LDN Clinical Dietitian See [facility] for contact information.

## 2023-01-02 NOTE — Progress Notes (Signed)
EEG complete - results pending 

## 2023-01-02 NOTE — Progress Notes (Signed)
PROGRESS NOTE        PATIENT DETAILS Name: Samantha [name] Age: 77 y.o. Sex: female Date of Birth: Aug 21, 1945 Admit Date: 12/31/2022 Admitting Physician Therisa Doyne, MD EAV:[id number], Windy Fast, MD  Brief Summary: Patient is a 77 y.o.  female with history of CKD 4, HTN, DM-2, HFpEF, OSA on CPAP, bipolar disorder, recent ESBL UTI at SNF treated with Invanz x 10 days-presenting from SNF following a unwitnessed fall and confusion.    Significant events: 8/18>> admit to [facility]  Significant studies: 8/18>> CXR: No PNA 8/18>> x-ray pelvis: No osseous injury 8/18 >>x-ray left pelvis: No osseous injury 8/18>> CT head: No acute abnormality 8/18>> CT C-spine: No fracture 8/18>> CT chest/abdomen/pelvis: No acute findings chest/abdomen/pelvis 8/18>> x-ray left shoulder: No acute osseous injury 8/18>> MRI brain: No acute abnormality 8/19>> NH4: 17 8/19>> echo: EF> 75%  Significant microbiology data: 8/18>> blood culture: No growth 8/18>> urine culture: No growth 8/18>> COVID PCR: Negative  Procedures: None  Consults: Palliative care  Subjective: Sleeping comfortably-awakes when slight painful stimuli applied to her legs-woke up for a bit after painful stimuli-and answered some questions appropriately.  Moving all 4 extremities.  Then went back to sleep.  Objective: Vitals: Blood pressure (!) 151/54, pulse 72, temperature 97.7 F (36.5 C), resp. rate 13, height 5\' 4"  (1.626 m), weight 88.9 kg, SpO2 98%.   Exam: Gen Exam:not in any distress HEENT:atraumatic, normocephalic Chest: B/L clear to auscultation anteriorly CVS:S1S2 regular Abdomen:soft non tender, non distended Extremities:no edema Neurology: Non focal Skin: no rash  Pertinent Labs/Radiology:    Latest Ref Rng & Units 01/02/2023    6:15 AM 01/01/2023    3:57 AM 12/31/2022    2:54 PM  CBC  WBC 4.0 - 10.5 K/uL 9.8  10.9    Hemoglobin 12.0 - 15.0 g/dL 91.4  78.2  95.6   Hematocrit 36.0 -  46.0 % 33.1  35.6  37.0   Platelets 150 - 400 K/uL 196  206      Lab Results  Component Value Date   NA 136 01/02/2023   K 4.4 01/02/2023   CL 103 01/02/2023   CO2 22 01/02/2023      Assessment/Plan: Acute metabolic encephalopathy Unclear etiology but possibly could be from AKI Per prior notes-improved from initial presentation-currently appears somewhat sedated but easily arousable-follows some commands when awake Supportive care Delirium precautions-unclear if she has a history of cognitive dysfunction/dementia Check EEG Repeat ammonia levels with a.m. labs Slightly decrease Haldol dosage-hold Lyrica/trazodone for now-as she appears slightly sedated this morning.  Recent ESBL E. coli UTI while at SNF Apparently completed 10 days of Invanz Afebrile-not felt to have ongoing UTI-continue to watch off antibiotics.  AKI on CKD 4 AKI likely hemodynamically mediated AKI improved-creatinine back to baseline. Avoid nephrotoxic agents.  Chronic HFpEF Euvolemic  HTN BP to be stable Amlodipine  HLD Statin  Bipolar disorder/schizoaffective disorder c/w Haldol but change dose to 0.5 mg twice daily-slightly as she is still sleepy this morning Depakote (level <10 on 8/18)  OSA CPAP nightly  Fall at [facility] Apparently slipped off the bed-no syncopal episode PT/OT eval  Obesity: Estimated body mass index is 33.64 kg/m as calculated from the following:   Height as of this encounter: 5\' 4"  (1.626 m).   Weight as of this encounter: 88.9 kg.   Code status:   Code Status: Full Code  DVT Prophylaxis: SCDs Start: 01/01/23 0024   Family Communication: None at bedside  Disposition Plan: Status is: Inpatient Remains inpatient appropriate because: severity of illness   Planned Discharge Destination:Skilled nursing facility   Diet: Diet Order             Diet renal with fluid restriction Fluid restriction: 1800 mL Fluid; Room service appropriate? Yes; Fluid consistency:  Thin  Diet effective now                     Antimicrobial agents: Anti-infectives (From admission, onward)    Start     Dose/Rate Route Frequency Ordered Stop   01/01/23 1200  meropenem (MERREM) 500 mg in sodium chloride 0.9 % 100 mL IVPB  Status:  Discontinued        500 mg 200 mL/hr over 30 Minutes Intravenous Every 12 hours 12/31/22 2334 01/01/23 1349   12/31/22 2215  meropenem (MERREM) 1 g in sodium chloride 0.9 % 100 mL IVPB        1 g 200 mL/hr over 30 Minutes Intravenous  Once 12/31/22 2201 12/31/22 2350   12/31/22 1915  cefTRIAXone (ROCEPHIN) 1 g in sodium chloride 0.9 % 100 mL IVPB        1 g 200 mL/hr over 30 Minutes Intravenous  Once 12/31/22 1907 12/31/22 2038   12/31/22 1900  fluconazole (DIFLUCAN) tablet 50 mg        50 mg Oral  Once 12/31/22 1846 12/31/22 2043        MEDICATIONS: Scheduled Meds:  amLODipine  10 mg Oral Daily   aspirin EC  81 mg Oral Daily   atorvastatin  20 mg Oral QHS   divalproex  125 mg Oral Q12H   haloperidol  0.5 mg Oral q AM   And   haloperidol  1 mg Oral QHS   hydrALAZINE  100 mg Oral TID   latanoprost  1 drop Both Eyes QHS   lidocaine  1 patch Transdermal Q24H   pantoprazole  40 mg Oral Daily   pregabalin  50 mg Oral Daily   tamsulosin  0.4 mg Oral Daily   traZODone  50 mg Oral QHS   Continuous Infusions: PRN Meds:.acetaminophen **OR** acetaminophen, albuterol, bisacodyl, dextrose, ondansetron **OR** ondansetron (ZOFRAN) IV   I have personally reviewed following labs and imaging studies  LABORATORY DATA: CBC: Recent Labs  Lab 12/31/22 1425 12/31/22 1454 01/01/23 0357 01/02/23 0615  WBC 11.1*  --  10.9* 9.8  HGB 11.3* 12.6 10.9* 10.3*  HCT 36.1 37.0 35.6* 33.1*  MCV 86.8  --  86.0 85.5  PLT 213  --  206 196    Basic Metabolic Panel: Recent Labs  Lab 12/31/22 1425 12/31/22 1454 01/01/23 0143 01/02/23 0615  NA 138 137 137 136  K 5.0 5.1 5.1 4.4  CL 99 105 103 103  CO2 24  --  25 22  GLUCOSE 113* 109*  81 93  BUN 61* 61* 57* 49*  CREATININE 3.54* 3.70* 3.27* 2.51*  CALCIUM 11.3*  --  10.7* 10.6*  MG 2.5*  --  2.4  --   PHOS  --   --  3.3  --     GFR: Estimated Creatinine Clearance: 20.6 mL/min (A) (by C-G formula based on SCr of 2.51 mg/dL (H)).  Liver Function Tests: Recent Labs  Lab 12/31/22 1425 01/01/23 0143  AST 14* 19  ALT 8 9  ALKPHOS 66 61  BILITOT 0.2* 0.8  PROT 7.7 7.5  ALBUMIN 3.9 3.7   No results for input(s): "LIPASE", "AMYLASE" in the last 168 hours. Recent Labs  Lab 01/01/23 0143  AMMONIA 17    Coagulation Profile: Recent Labs  Lab 12/31/22 1425  INR 1.0    Cardiac Enzymes: Recent Labs  Lab 12/31/22 1425  CKTOTAL 50    BNP (last 3 results) No results for input(s): "PROBNP" in the last 8760 hours.  Lipid Profile: No results for input(s): "CHOL", "HDL", "LDLCALC", "TRIG", "CHOLHDL", "LDLDIRECT" in the last 72 hours.  Thyroid Function Tests: No results for input(s): "TSH", "T4TOTAL", "FREET4", "T3FREE", "THYROIDAB" in the last 72 hours.  Anemia Panel: No results for input(s): "VITAMINB12", "FOLATE", "FERRITIN", "TIBC", "IRON", "RETICCTPCT" in the last 72 hours.  Urine analysis:    Component Value Date/Time   COLORURINE YELLOW 12/31/2022 1712   APPEARANCEUR CLOUDY (A) 12/31/2022 1712   LABSPEC 1.012 12/31/2022 1712   PHURINE 5.0 12/31/2022 1712   GLUCOSEU NEGATIVE 12/31/2022 1712   HGBUR NEGATIVE 12/31/2022 1712   BILIRUBINUR NEGATIVE 12/31/2022 1712   KETONESUR NEGATIVE 12/31/2022 1712   PROTEINUR NEGATIVE 12/31/2022 1712   UROBILINOGEN 0.2 04/05/2014 2138   NITRITE POSITIVE (A) 12/31/2022 1712   LEUKOCYTESUR TRACE (A) 12/31/2022 1712    Sepsis Labs: Lactic Acid, Venous    Component Value Date/Time   LATICACIDVEN 1.8 12/31/2022 1455    MICROBIOLOGY: Recent Results (from the past 240 hour(s))  Blood culture (routine x 2)     Status: None (Preliminary result)   Collection Time: 12/31/22  2:22 PM   Specimen: BLOOD  Result  Value Ref Range Status   Specimen Description BLOOD RIGHT ANTECUBITAL  Final   Special Requests   Final    BOTTLES DRAWN AEROBIC AND ANAEROBIC Blood Culture adequate volume   Culture   Final    NO GROWTH 2 DAYS Performed at [facility], [address], [place], [place] [address]    Report Status PENDING  Incomplete  Urine Culture (for pregnant, neutropenic or urologic patients or patients with an indwelling urinary catheter)     Status: None   Collection Time: 12/31/22  9:38 PM   Specimen: Urine, Clean Catch  Result Value Ref Range Status   Specimen Description URINE, CLEAN CATCH  Final   Special Requests NONE  Final   Culture   Final    NO GROWTH Performed at [facility], [address] [place], [place] [address]    Report Status 01/01/2023 FINAL  Final  SARS Coronavirus 2 by RT PCR (hospital order, performed in [facility] hospital lab) *cepheid single result test* Peripheral     Status: None   Collection Time: 12/31/22 10:58 PM   Specimen: Peripheral; Nasal Swab  Result Value Ref Range Status   SARS Coronavirus 2 by RT PCR NEGATIVE NEGATIVE Final    Comment: Performed at [facility], [address] [place], [place] [address]  Blood culture (routine x 2)     Status: None (Preliminary result)   Collection Time: 12/31/22 11:16 PM   Specimen: BLOOD  Result Value Ref Range Status   Specimen Description BLOOD SITE NOT SPECIFIED  Final   Special Requests   Final    BOTTLES DRAWN AEROBIC AND ANAEROBIC Blood Culture adequate volume   Culture   Final    NO GROWTH 2 DAYS Performed at [facility], [address], [place], [place] [address]    Report Status PENDING  Incomplete    RADIOLOGY STUDIES/RESULTS: ECHOCARDIOGRAM COMPLETE  Result Date: 01/01/2023    ECHOCARDIOGRAM REPORT   Patient Name:   Samantha [name] Date of Exam: 01/01/2023 Medical Rec #:  [id number]       Height:       64.0 in Accession #:    [id number]      Weight:       196.0 lb Date of  Birth:  1945/06/23        BSA:          1.940 m Patient Age:    77 years        BP:           140/67 mmHg Patient Gender: F               HR:           81 bpm. Exam Location:  Inpatient Procedure: 2D Echo, Color Doppler and Cardiac Doppler Indications:    R06.9 DOE  History:        Patient has no prior history of Echocardiogram examinations.                 CHF; Risk Factors:Hypertension, Diabetes, Dyslipidemia, Sleep                 Apnea and CKD.  Sonographer:    Irving Burton Senior RDCS Referring Phys: Consepcion Hearing ANASTASSIA DOUTOVA IMPRESSIONS  1. Left ventricular ejection fraction, by estimation, is >75%. Left ventricular ejection fraction by PLAX is 73 %. The left ventricle has hyperdynamic function. The left ventricle has no regional wall motion abnormalities. There is moderate concentric left ventricular hypertrophy of the septal segment. Indeterminate diastolic filling due to E-A fusion.  2. Right ventricular systolic function is normal. The right ventricular size is normal. There is normal pulmonary artery systolic pressure. The estimated right ventricular systolic pressure is 29.0 mmHg.  3. The mitral valve is normal in structure. No evidence of mitral valve regurgitation. No evidence of mitral stenosis.  4. The aortic valve is tricuspid. Aortic valve regurgitation is not visualized. No aortic stenosis is present.  5. The inferior vena cava is normal in size with greater than 50% respiratory variability, suggesting right atrial pressure of 3 mmHg. Comparison(s): No prior Echocardiogram. FINDINGS  Left Ventricle: Left ventricular ejection fraction, by estimation, is >75%. Left ventricular ejection fraction by PLAX is 73 %. The left ventricle has hyperdynamic function. The left ventricle has no regional wall motion abnormalities. The left ventricular internal cavity size was normal in size. There is moderate concentric left ventricular hypertrophy of the septal segment. Indeterminate diastolic filling due to E-A fusion.  Right Ventricle: The right ventricular size is normal. Right ventricular systolic function is normal. There is normal pulmonary artery systolic pressure. The tricuspid regurgitant velocity is 2.55 m/s, and with an assumed right atrial pressure of 3 mmHg,  the estimated right ventricular systolic pressure is 29.0 mmHg. Left Atrium: Left atrial size was normal in size. Right Atrium: Right atrial size was normal in size. Pericardium: Trivial pericardial effusion is present. Presence of epicardial fat layer. Mitral Valve: The mitral valve is normal in structure. No evidence of mitral valve regurgitation. No evidence of mitral valve stenosis. Tricuspid Valve: The tricuspid valve is normal in structure. Tricuspid valve regurgitation is mild . No evidence of tricuspid stenosis. Aortic Valve: The aortic valve is tricuspid. Aortic valve regurgitation is not visualized. No aortic stenosis is present. Pulmonic Valve: The pulmonic valve was normal in structure. Pulmonic valve regurgitation is not visualized. No evidence of pulmonic stenosis.  Aorta: The aortic root and ascending aorta are structurally normal, with no evidence of dilitation. Venous: The inferior vena cava is normal in size with greater than 50% respiratory variability, suggesting right atrial pressure of 3 mmHg. IAS/Shunts: No atrial level shunt detected by color flow Doppler.  LEFT VENTRICLE PLAX 2D LV EF:         Left ventricular ejection fraction by PLAX is 73 %. LVIDd:         2.70 cm LVIDs:         1.60 cm LV PW:         1.10 cm LV IVS:        1.40 cm LVOT diam:     1.90 cm LV SV:         58 LV SV Index:   30 LVOT Area:     2.84 cm  RIGHT VENTRICLE RV S prime:     8.92 cm/s TAPSE (M-mode): 2.0 cm LEFT ATRIUM             Index        RIGHT ATRIUM           Index LA diam:        2.20 cm 1.13 cm/m   RA Area:     16.10 cm LA Vol (A2C):   33.8 ml 17.43 ml/m  RA Volume:   37.60 ml  19.39 ml/m LA Vol (A4C):   36.7 ml 18.92 ml/m LA Biplane Vol: 36.9 ml 19.02  ml/m  AORTIC VALVE LVOT Vmax:   101.00 cm/s LVOT Vmean:  73.900 cm/s LVOT VTI:    0.205 m  AORTA Ao Root diam: 2.80 cm Ao Asc diam:  3.50 cm TRICUSPID VALVE TR Peak grad:   26.0 mmHg TR Vmax:        255.00 cm/s  SHUNTS Systemic VTI:  0.20 m Systemic Diam: 1.90 cm Olga Millers MD Electronically signed by Olga Millers MD Signature Date/Time: 01/01/2023/9:47:57 AM    Final    MR Cervical Spine Wo Contrast  Result Date: 12/31/2022 CLINICAL DATA:  Initial evaluation for acute trauma, fall. EXAM: MRI CERVICAL SPINE WITHOUT CONTRAST TECHNIQUE: Multiplanar, multisequence MR imaging of the cervical spine was performed. No intravenous contrast was administered. COMPARISON:  Prior CT from earlier the same day. FINDINGS: Alignment: Straightening with mild reversal of the normal cervical lordosis. Trace degenerative retrolisthesis of C5 on C6. Vertebrae: Vertebral body height maintained without acute or chronic fracture. Bone marrow signal intensity within normal limits. No discrete or worrisome osseous lesions. No abnormal marrow edema. Cord: Normal signal and morphology. No evidence for ligamentous injury. Posterior Fossa, vertebral arteries, paraspinal tissues: Craniocervical junction normal. Small focus of mild edema present within the posterior paraspinous musculature at the level of C4-5 (series 8, image 8, 12, likely mild muscular injury/strain in the setting of trauma. Paraspinous soft tissues demonstrate no other acute finding. Normal flow voids seen within the vertebral arteries bilaterally. Disc levels: C2-C3: Mild disc bulge with uncovertebral hypertrophy. Mild right-sided facet hypertrophy. No significant spinal stenosis. Foramina remain adequately patent. C3-C4: Mild uncovertebral spurring without significant disc bulge. Mild facet hypertrophy, greater on the right. No spinal stenosis. Foramina remain patent. C4-C5: Degenerate intervertebral disc space narrowing with circumferential disc osteophyte complex.  Posterior component flattens and partially effaces the ventral thecal sac. Mild cord flattening without cord signal changes. Mild spinal stenosis. Mild right with mild to moderate left C5 foraminal stenosis. C5-C6: Degenerative intervertebral disc space narrowing with circumferential disc osteophyte complex. Broad  posterior component flattens and effaces the ventral thecal sac. Superimposed mild facet and ligament flavum hypertrophy. Mild cord flattening without cord signal changes. Moderate spinal stenosis with severe bilateral C6 foraminal narrowing. C6-C7: Mild disc bulge with uncovertebral spurring. Mild facet hypertrophy. No spinal stenosis. Foramina remain patent. C7-T1: Negative interspace. Mild facet hypertrophy. No spinal stenosis. Foramina remain patent. IMPRESSION: 1. Mild edema within the posterior paraspinous musculature at the level of C4-5, likely mild muscular injury/strain in the setting of trauma. 2. No other evidence for acute traumatic injury within the cervical spine. No evidence for traumatic cord or ligamentous injury. 3. Multilevel cervical spondylosis with resultant mild to moderate spinal stenosis at C4-5 and C5-6. Associated mild to moderate left C5 foraminal narrowing, with severe bilateral C6 foraminal stenosis. Electronically Signed   By: Rise Mu M.D.   On: 12/31/2022 21:51   MR BRAIN WO CONTRAST  Result Date: 12/31/2022 CLINICAL DATA:  Initial evaluation for mental status change, unknown cause. EXAM: MRI HEAD WITHOUT CONTRAST TECHNIQUE: Multiplanar, multiecho pulse sequences of the brain and surrounding structures were obtained without intravenous contrast. COMPARISON:  Prior CT from earlier the same day. FINDINGS: Brain: Diffuse prominence of the CSF containing spaces compatible generalized cerebral atrophy. Patchy T2/FLAIR hyperintensity involving the periventricular deep white matter, consistent with chronic small vessel ischemic disease, mild for age.  Encephalomalacia and gliosis involving the right occipital lobe consistent with a chronic right PCA distribution infarct. No evidence for acute or subacute ischemia. Gray-white matter differentiation otherwise maintained. No other as a chronic cortical infarction. No acute intracranial hemorrhage. Single punctate chronic microhemorrhage noted at the left external capsule. No mass lesion, midline shift or mass effect. No hydrocephalus or extra-axial fluid collection. Pituitary gland suprasellar region within normal limits. Vascular: Major intracranial vascular flow voids are maintained. Skull and upper cervical spine: Craniocervical junction within normal limits. Bone marrow signal intensity normal. No scalp soft tissue abnormality. Sinuses/Orbits: Prior bilateral ocular lens replacement. Scattered mucosal thickening noted throughout the paranasal sinuses, most pronounced at the frontoethmoidal region. No mastoid effusion. Other: None. IMPRESSION: 1. No acute intracranial abnormality. 2. Chronic right PCA distribution infarct. 3. 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 Electronically Signed   By: Rise Mu M.D.   On: 12/31/2022 21:37   DG Shoulder Left  Result Date: 12/31/2022 CLINICAL DATA:  Status post fall, left shoulder pain EXAM: LEFT SHOULDER - 2+ VIEW COMPARISON:  None Available. FINDINGS: No fracture or dislocation. Generalized osteopenia. Mild degenerative changes of the acromioclavicular joint. Soft tissues are normal. IMPRESSION: 1. No acute osseous injury of the left shoulder. Electronically Signed   By: Elige Ko M.D.   On: 12/31/2022 18:35   CT L-SPINE NO CHARGE  Result Date: 12/31/2022 CLINICAL DATA:  Fall. EXAM: CT THORACIC AND LUMBAR SPINE WITHOUT CONTRAST TECHNIQUE: Multi planar CT imaging of the thoracic and lumbar spine were reconstructed from CT chest, abdomen and pelvis without contrast. RADIATION DOSE REDUCTION: This exam  was performed according to the departmental dose-optimization program which includes automated exposure control, adjustment of the mA and/or kV according to patient size and/or use of iterative reconstruction technique. COMPARISON:  None Available. FINDINGS: CT THORACIC SPINE FINDINGS Alignment: Normal. Vertebrae: Normal vertebral body heights. No acute fracture or suspicious bone lesions. Paraspinal and other soft tissues: Unremarkable. Disc levels: Mild degenerative changes of the midthoracic spine. No spinal canal stenosis or neural foraminal narrowing. CT LUMBAR SPINE FINDINGS Segmentation: Conventional numbering is assumed with 5 non-rib-bearing, lumbar type vertebral  bodies. Alignment: Normal. Vertebrae: Normal vertebral body heights. No acute fracture or suspicious bone lesion. Multilevel degenerative endplate changes. Paraspinal and other soft tissues: Unremarkable. Disc levels: Multilevel lumbar spondylosis, worst at L3-4 and L4-5, where there is at least mild spinal canal stenosis. IMPRESSION: 1. No acute fracture or traumatic listhesis of the thoracic or lumbar spine. 2. Multilevel lumbar spondylosis, worst at L3-4 and L4-5, where there is at least mild spinal canal stenosis. Electronically Signed   By: Orvan Falconer M.D.   On: 12/31/2022 16:19   CT T-SPINE NO CHARGE  Result Date: 12/31/2022 CLINICAL DATA:  Fall. EXAM: CT THORACIC AND LUMBAR SPINE WITHOUT CONTRAST TECHNIQUE: Multi planar CT imaging of the thoracic and lumbar spine were reconstructed from CT chest, abdomen and pelvis without contrast. RADIATION DOSE REDUCTION: This exam was performed according to the departmental dose-optimization program which includes automated exposure control, adjustment of the mA and/or kV according to patient size and/or use of iterative reconstruction technique. COMPARISON:  None Available. FINDINGS: CT THORACIC SPINE FINDINGS Alignment: Normal. Vertebrae: Normal vertebral body heights. No acute fracture or  suspicious bone lesions. Paraspinal and other soft tissues: Unremarkable. Disc levels: Mild degenerative changes of the midthoracic spine. No spinal canal stenosis or neural foraminal narrowing. CT LUMBAR SPINE FINDINGS Segmentation: Conventional numbering is assumed with 5 non-rib-bearing, lumbar type vertebral bodies. Alignment: Normal. Vertebrae: Normal vertebral body heights. No acute fracture or suspicious bone lesion. Multilevel degenerative endplate changes. Paraspinal and other soft tissues: Unremarkable. Disc levels: Multilevel lumbar spondylosis, worst at L3-4 and L4-5, where there is at least mild spinal canal stenosis. IMPRESSION: 1. No acute fracture or traumatic listhesis of the thoracic or lumbar spine. 2. Multilevel lumbar spondylosis, worst at L3-4 and L4-5, where there is at least mild spinal canal stenosis. Electronically Signed   By: Orvan Falconer M.D.   On: 12/31/2022 16:19   CT HEAD WO CONTRAST  Result Date: 12/31/2022 CLINICAL DATA:  Head trauma, moderate-severe; Polytrauma, blunt. Fall. EXAM: CT HEAD WITHOUT CONTRAST CT CERVICAL SPINE WITHOUT CONTRAST CT CHEST, ABDOMEN AND PELVIS WITHOUT CONTRAST TECHNIQUE: Contiguous axial images were obtained from the base of the skull through the vertex without intravenous contrast. Multidetector CT imaging of the cervical spine was performed without intravenous contrast. Multiplanar CT image reconstructions were also generated. Multidetector CT imaging of the chest, abdomen and pelvis was performed following the standard protocol without IV contrast. RADIATION DOSE REDUCTION: This exam was performed according to the departmental dose-optimization program which includes automated exposure control, adjustment of the mA and/or kV according to patient size and/or use of iterative reconstruction technique. COMPARISON:  Head CT 01/25/2019.  CT abdomen/pelvis 09/12/2015. FINDINGS: CT HEAD FINDINGS Brain: No acute hemorrhage. Old infarct in the right  occipital lobe. Cortical gray-white differentiation is otherwise preserved. Background of mild chronic small-vessel disease. No hydrocephalus or extra-axial collection. No mass effect or midline shift. Vascular: No hyperdense vessel or unexpected calcification. Skull: No calvarial fracture or suspicious bone lesion. Skull base is unremarkable. Sinuses/Orbits: Mild paranasal sinus disease. Orbits are unremarkable. Other: None. CT CERVICAL FINDINGS Alignment: Normal. Skull base and vertebrae: No acute fracture. Normal craniocervical junction. No suspicious bone lesions. Soft tissues and spinal canal: No prevertebral fluid or swelling. No visible canal hematoma. Disc levels: Mild cervical spondylosis without high-grade spinal canal stenosis. Other: Atherosclerotic calcifications of the carotid bulbs. CT CHEST FINDINGS Cardiovascular: No acute vascular findings. Normal heart size. No pericardial effusion. Severe coronary artery calcifications. Atherosclerotic calcifications of the thoracic aorta. Mediastinum/Nodes: No enlarged mediastinal, hilar,  or axillary lymph nodes. Thyroid gland, trachea, and esophagus demonstrate no significant findings. Lungs/Pleura: Subsegmental atelectasis in the lung bases. No consolidation, pulmonary edema or traumatic findings. No pleural effusion or pneumothorax. Musculoskeletal: No chest wall mass or suspicious bone lesions identified. CT ABDOMEN AND PELVIS FINDINGS Hepatobiliary: No hepatic injury or perihepatic hematoma. Gallbladder is unremarkable. Pancreas: Unremarkable. No pancreatic ductal dilatation or surrounding inflammatory changes. Spleen: No splenic injury or perisplenic hematoma. Adrenals/Urinary Tract: No adrenal hemorrhage or renal injury identified. Bladder is unremarkable. Stomach/Bowel: Stomach is within normal limits. Appendix appears normal. No evidence of bowel wall thickening, distention, or inflammatory changes. Vascular/Lymphatic: Aortic atherosclerosis. No  enlarged abdominal or pelvic lymph nodes. Reproductive: Calcified uterine fibroids.  No adnexal masses. Other: No abdominal wall hernia or abnormality. No abdominopelvic ascites. Musculoskeletal: No fracture is seen. IMPRESSION: 1. No acute intracranial abnormality. 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 2. No acute fracture or traumatic listhesis of the cervical spine. 3. No acute traumatic findings in the chest, abdomen, or pelvis. 4. Coronary artery disease. Aortic Atherosclerosis (ICD10-I70.0). Electronically Signed   By: Orvan Falconer M.D.   On: 12/31/2022 16:14   CT CERVICAL SPINE WO CONTRAST  Result Date: 12/31/2022 CLINICAL DATA:  Head trauma, moderate-severe; Polytrauma, blunt. Fall. EXAM: CT HEAD WITHOUT CONTRAST CT CERVICAL SPINE WITHOUT CONTRAST CT CHEST, ABDOMEN AND PELVIS WITHOUT CONTRAST TECHNIQUE: Contiguous axial images were obtained from the base of the skull through the vertex without intravenous contrast. Multidetector CT imaging of the cervical spine was performed without intravenous contrast. Multiplanar CT image reconstructions were also generated. Multidetector CT imaging of the chest, abdomen and pelvis was performed following the standard protocol without IV contrast. RADIATION DOSE REDUCTION: This exam was performed according to the departmental dose-optimization program which includes automated exposure control, adjustment of the mA and/or kV according to patient size and/or use of iterative reconstruction technique. COMPARISON:  Head CT 01/25/2019.  CT abdomen/pelvis 09/12/2015. FINDINGS: CT HEAD FINDINGS Brain: No acute hemorrhage. 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 Cortical gray-white differentiation is otherwise preserved. Background of mild chronic small-vessel disease. No hydrocephalus or extra-axial collection. No mass effect or midline shift. Vascular: No hyperdense vessel or unexpected calcification. Skull: No calvarial fracture or suspicious bone lesion. Skull base  is unremarkable. Sinuses/Orbits: Mild paranasal sinus disease. Orbits are unremarkable. Other: None. CT CERVICAL FINDINGS Alignment: Normal. Skull base and vertebrae: No acute fracture. Normal craniocervical junction. No suspicious bone lesions. Soft tissues and spinal canal: No prevertebral fluid or swelling. No visible canal hematoma. Disc levels: Mild cervical spondylosis without high-grade spinal canal stenosis. Other: Atherosclerotic calcifications of the carotid bulbs. CT CHEST FINDINGS Cardiovascular: No acute vascular findings. Normal heart size. No pericardial effusion. Severe coronary artery calcifications. Atherosclerotic calcifications of the thoracic aorta. Mediastinum/Nodes: No enlarged mediastinal, hilar, or axillary lymph nodes. Thyroid gland, trachea, and esophagus demonstrate no significant findings. Lungs/Pleura: Subsegmental atelectasis in the lung bases. No consolidation, pulmonary edema or traumatic findings. No pleural effusion or pneumothorax. Musculoskeletal: No chest wall mass or suspicious bone lesions identified. CT ABDOMEN AND PELVIS FINDINGS Hepatobiliary: No hepatic injury or perihepatic hematoma. Gallbladder is unremarkable. Pancreas: Unremarkable. No pancreatic ductal dilatation or surrounding inflammatory changes. Spleen: No splenic injury or perisplenic hematoma. Adrenals/Urinary Tract: No adrenal hemorrhage or renal injury identified. Bladder is unremarkable. Stomach/Bowel: Stomach is within normal limits. Appendix appears normal. No evidence of bowel wall thickening, distention, or inflammatory changes. Vascular/Lymphatic: Aortic atherosclerosis. No enlarged abdominal or pelvic lymph nodes. Reproductive: Calcified uterine fibroids.  No adnexal masses.  Other: No abdominal wall hernia or abnormality. No abdominopelvic ascites. Musculoskeletal: No fracture is seen. IMPRESSION: 1. No acute intracranial abnormality. 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 2. No acute fracture or  traumatic listhesis of the cervical spine. 3. No acute traumatic findings in the chest, abdomen, or pelvis. 4. Coronary artery disease. Aortic Atherosclerosis (ICD10-I70.0). Electronically Signed   By: Orvan Falconer M.D.   On: 12/31/2022 16:14   CT CHEST ABDOMEN PELVIS WO CONTRAST  Result Date: 12/31/2022 CLINICAL DATA:  Head trauma, moderate-severe; Polytrauma, blunt. Fall. EXAM: CT HEAD WITHOUT CONTRAST CT CERVICAL SPINE WITHOUT CONTRAST CT CHEST, ABDOMEN AND PELVIS WITHOUT CONTRAST TECHNIQUE: Contiguous axial images were obtained from the base of the skull through the vertex without intravenous contrast. Multidetector CT imaging of the cervical spine was performed without intravenous contrast. Multiplanar CT image reconstructions were also generated. Multidetector CT imaging of the chest, abdomen and pelvis was performed following the standard protocol without IV contrast. RADIATION DOSE REDUCTION: This exam was performed according to the departmental dose-optimization program which includes automated exposure control, adjustment of the mA and/or kV according to patient size and/or use of iterative reconstruction technique. COMPARISON:  Head CT 01/25/2019.  CT abdomen/pelvis 09/12/2015. FINDINGS: CT HEAD FINDINGS Brain: No acute hemorrhage. 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 Cortical gray-white differentiation is otherwise preserved. Background of mild chronic small-vessel disease. No hydrocephalus or extra-axial collection. No mass effect or midline shift. Vascular: No hyperdense vessel or unexpected calcification. Skull: No calvarial fracture or suspicious bone lesion. Skull base is unremarkable. Sinuses/Orbits: Mild paranasal sinus disease. Orbits are unremarkable. Other: None. CT CERVICAL FINDINGS Alignment: Normal. Skull base and vertebrae: No acute fracture. Normal craniocervical junction. No suspicious bone lesions. Soft tissues and spinal canal: No prevertebral fluid or swelling. No visible  canal hematoma. Disc levels: Mild cervical spondylosis without high-grade spinal canal stenosis. Other: Atherosclerotic calcifications of the carotid bulbs. CT CHEST FINDINGS Cardiovascular: No acute vascular findings. Normal heart size. No pericardial effusion. Severe coronary artery calcifications. Atherosclerotic calcifications of the thoracic aorta. Mediastinum/Nodes: No enlarged mediastinal, hilar, or axillary lymph nodes. Thyroid gland, trachea, and esophagus demonstrate no significant findings. Lungs/Pleura: Subsegmental atelectasis in the lung bases. No consolidation, pulmonary edema or traumatic findings. No pleural effusion or pneumothorax. Musculoskeletal: No chest wall mass or suspicious bone lesions identified. CT ABDOMEN AND PELVIS FINDINGS Hepatobiliary: No hepatic injury or perihepatic hematoma. Gallbladder is unremarkable. Pancreas: Unremarkable. No pancreatic ductal dilatation or surrounding inflammatory changes. Spleen: No splenic injury or perisplenic hematoma. Adrenals/Urinary Tract: No adrenal hemorrhage or renal injury identified. Bladder is unremarkable. Stomach/Bowel: Stomach is within normal limits. Appendix appears normal. No evidence of bowel wall thickening, distention, or inflammatory changes. Vascular/Lymphatic: Aortic atherosclerosis. No enlarged abdominal or pelvic lymph nodes. Reproductive: Calcified uterine fibroids.  No adnexal masses. Other: No abdominal wall hernia or abnormality. No abdominopelvic ascites. Musculoskeletal: No fracture is seen. IMPRESSION: 1. No acute intracranial abnormality. 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 2. No acute fracture or traumatic listhesis of the cervical spine. 3. No acute traumatic findings in the chest, abdomen, or pelvis. 4. Coronary artery disease. Aortic Atherosclerosis (ICD10-I70.0). Electronically Signed   By: Orvan Falconer M.D.   On: 12/31/2022 16:14   DG Elbow Complete Left  Result Date: 12/31/2022 CLINICAL DATA:  Status post  fall, left elbow pain EXAM: LEFT ELBOW - COMPLETE 3+ VIEW COMPARISON:  None Available. FINDINGS: There is no evidence of fracture, dislocation, or joint effusion. There is no evidence of arthropathy or other focal  bone abnormality. Soft tissues are unremarkable. IMPRESSION: Negative. Electronically Signed   By: Elige Ko M.D.   On: 12/31/2022 15:05   DG Pelvis Portable  Result Date: 12/31/2022 CLINICAL DATA:  Status post fall, trauma EXAM: PORTABLE PELVIS 1-2 VIEWS COMPARISON:  None Available. FINDINGS: No acute fracture or dislocation. No aggressive osseous lesion. Normal alignment. Generalized osteopenia. Lower lumbar spine spondylosis. Calcified right uterine fibroid. Peripheral vascular atherosclerotic disease. No radiopaque foreign body or soft tissue emphysema. IMPRESSION: 1. No acute osseous injury of the pelvis. If there is further clinical concern, recommend a CT of the pelvis. Electronically Signed   By: Elige Ko M.D.   On: 12/31/2022 15:04   DG Chest Port 1 View  Result Date: 12/31/2022 CLINICAL DATA:  Status post fall, trauma EXAM: PORTABLE CHEST 1 VIEW COMPARISON:  06/04/2020 FINDINGS: Mild lingular scarring versus atelectasis. No focal consolidation. No pleural effusion or pneumothorax. Heart and mediastinal contours are unremarkable. No acute osseous abnormality. IMPRESSION: No active disease. Electronically Signed   By: Elige Ko M.D.   On: 12/31/2022 15:02     LOS: 2 days   Jeoffrey Massed, MD  [facility]    To contact the attending provider between 7A-7P or the covering provider during after hours 7P-7A, please log into the web site [email or web address] and access using universal [place] password for that web site. If you do not have the password, please call the hospital operator.  01/02/2023, 9:11 AM

## 2023-01-02 NOTE — TOC Initial Note (Signed)
Transition of Care [facility]) - Initial/Assessment Note    Patient Details  Name: Samantha [name] MRN: [id number] Date of Birth: 1945-09-03  [facility]) CM/SW Contact:    Erin Sons, LCSW Phone Number: 01/02/2023, 1:48 PM  Clinical Narrative:                  Pt admitted form [facility] and can return when medically ready. CSW updated [facility]. TOC will continue to follow to assist with discharge once medically stable.    Expected Discharge Plan: Skilled Nursing Facility Barriers to Discharge: Continued Medical Work up     Expected Discharge Plan and Services       Living arrangements for the past 2 months: Skilled Nursing Facility                                      Prior Living Arrangements/Services Living arrangements for the past 2 months: Skilled Nursing Facility Lives with:: Facility Resident                   Activities of Daily Living Home Assistive Devices/Equipment: Wheelchair ADL Screening (condition at time of admission) Patient's cognitive ability adequate to safely complete daily activities?: Yes Is the patient deaf or have difficulty hearing?: No Does the patient have difficulty seeing, even when wearing glasses/contacts?: Yes Does the patient have difficulty concentrating, remembering, or making decisions?: No Patient able to express need for assistance with ADLs?: Yes Does the patient have difficulty dressing or bathing?: Yes Independently performs ADLs?: No Communication: Dependent Is this a change from baseline?: Pre-admission baseline Dressing (OT): Dependent Is this a change from baseline?: Pre-admission baseline Grooming: Dependent Is this a change from baseline?: Pre-admission baseline Feeding: Dependent Is this a change from baseline?: Pre-admission baseline Bathing: Dependent Is this a change from baseline?: Pre-admission baseline Toileting: Dependent Is this a change from baseline?: Pre-admission  baseline In/Out Bed: Dependent Is this a change from baseline?: Pre-admission baseline Walks in Home: Dependent Is this a change from baseline?: Pre-admission baseline Does the patient have difficulty walking or climbing stairs?: Yes Weakness of Legs: Both Weakness of Arms/Hands: Both  Permission Sought/Granted                  Emotional Assessment              Admission diagnosis:  Acute encephalopathy [G93.40] Generalized weakness [R53.1] Cystitis [N30.90] Fall, initial encounter [W19.XXXA] Chronic kidney disease, unspecified CKD stage [N18.9] Patient Active Problem List   Diagnosis Date Noted   Acute encephalopathy 12/31/2022   UTI (urinary tract infection) 12/31/2022   Hyperlipidemia 12/31/2022   CHF (congestive heart failure) (HCC) 11/04/2021   Hypertension associated with stage 3 chronic kidney disease due to type 2 diabetes mellitus (HCC) 11/04/2021   Neuropathy 11/04/2021   Metabolic bone disease 05/05/2021   Parathyroid adenoma 05/05/2021   AV block, Mobitz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) 02/03/2021   Hyperkalemia 02/03/2021   Type 2 diabetes mellitus with diabetic chronic kidney disease (HCC) 02/03/2021   Impacted cerumen of right ear 07/09/2016   Cough 07/07/2016   Exposure to influenza 07/07/2016   AKI (acute kidney injury) (HCC) 06/28/2016   Pulmonary edema with congestive heart failure with preserved left ventricular function (HCC) 06/25/2016   Urinary retention 06/24/2016   Abdominal distension 06/23/2016   Bradycardia 06/23/2016   Delirium  06/23/2016   Normocytic anemia 06/23/2016   Acute renal failure superimposed on stage 3 chronic kidney disease (HCC) 06/18/2016   Hypoglycemia associated with type 2 diabetes mellitus (HCC) 06/10/2016   Vitamin D deficiency 06/09/2016   At risk for falls 06/07/2016   Constipation, unspecified 06/06/2016   Hemorrhoids without complication 06/06/2016   Hx  of completed stroke 06/06/2016   Hypercalcemia 06/06/2016   Mild intermittent asthma without complication 06/06/2016   Sleep apnea, unspecified 06/06/2016   Schizoaffective disorder, depressive type (HCC) 06/03/2016   Auditory hallucination    Paranoia (HCC)    Visual hallucinations    Hyperglycemia 08/31/2013   Bipolar 1 disorder, depressed (HCC) 08/29/2013   Psychosis (HCC) 08/29/2013   Bipolar 1 disorder (HCC) 08/29/2013   PCP:  Renford Dills, MD Pharmacy:   [facility] - [place], [place] - [address] [place] [address] Phone: [phone] Fax: [phone]  [id number] - [place], [place] - [address] AT CORNER OF [facility] [address] [place] [address] Phone: [phone] Fax: [phone]     Social Determinants of Health (SDOH) Social History: SDOH Screenings   Food Insecurity: No Food Insecurity (01/01/2023)  Housing: Patient Declined (01/02/2023)  Transportation Needs: Patient Declined (01/02/2023)  Utilities: Patient Declined (01/02/2023)  Social Connections: Unknown (09/26/2021)   Received from [facility]  Tobacco Use: Unknown (01/01/2023)   SDOH Interventions:     Readmission Risk Interventions     No data to display

## 2023-01-02 NOTE — Progress Notes (Signed)
   01/02/23 2200  BiPAP/CPAP/SIPAP  $ Non-Invasive Ventilator  Non-Invasive Vent Initial  $ Face Mask Medium Yes  BiPAP/CPAP/SIPAP Pt Type Adult  BiPAP/CPAP/SIPAP Resmed  Mask Type Full face mask  Mask Size Medium  PEEP 5 cmH20  Patient Home Equipment No  Auto Titrate No   Pt comfortable asleep on bipap.  RT will continue to monitor

## 2023-01-02 NOTE — Progress Notes (Signed)
Daily Progress Note   Patient Name: Samantha [name]       Date: 01/02/2023 DOB: 07-14-1945  Age: 77 y.o. MRN#: [id number] Attending Physician: Samantha Bees, MD Primary Care Physician: Samantha Dills, MD Admit Date: 12/31/2022  Reason for Consultation/Follow-up: Establishing goals of care  Subjective: Medical records reviewed including progress notes, labs, imaging. Patient assessed at the bedside.  She is sleeping comfortably.  No family present during my visit.  I called patient's daughter Samantha [name] for ongoing palliative support and goals of care discussions.  She has no further questions after our conversation yesterday, reports a good understanding of patient's overall illness and the importance of further conversation.  She has a background in medicine.  She remains agreeable to outpatient palliative care follow-up so that patient can participate in her familiar environment and hopefully express her true feelings on her care preferences without fluctuation or fear.  She will be present at 1030 tomorrow morning and remains interested in supporting patient through completion of advanced directives.  Questions and concerns addressed. PMT will continue to support holistically.   Length of Stay: 2  Physical Exam Vitals and nursing note reviewed.  Constitutional:      General: She is not in acute distress. Cardiovascular:     Rate and Rhythm: Normal rate.  Pulmonary:     Effort: Pulmonary effort is normal.  Skin:    General: Skin is warm and dry.            Vital Signs: BP (!) 151/54 (BP Location: Right Arm)   Pulse 72   Temp 97.7 F (36.5 C)   Resp 13   Ht 5\' 4"  (1.626 m)   Wt 88.9 kg   SpO2 98%   BMI 33.64 kg/m  SpO2: SpO2: 98 % O2 Device: O2 Device: Room Air O2 Flow  Rate:        Palliative Assessment/Data:    Palliative Care Assessment & Plan   Patient Profile: 77 y.o. female  with past medical history of of bipolar, CKD stage 4, CHF, HTN, DM2, CVA, hypercalcemia, obesity , GERD, neuropathy, Mobitz type I heart block admitted on 12/31/2022 with fall and confusion at [facility].    Patient was recently diagnosed with UTI at SNF and now admitted for acute metabolic encephalopathy, AKI on CKD 4/5. PMT has been consulted  to assist with goals of care conversation, CODE STATUS.  Assessment: Goals of care conversation Acute metabolic encephalopathy, improving AKI on CKD 4/5, improving  Recommendations/Plan: Continue full code/full scope treatment TOC previously consulted for outpatient palliative care referral.  Patient will benefit from further GOC conversations, as her desire for aggressive interventions during this admission is inconsistent with her recent statements to family Patient's daughter will be present at 10:30 tomorrow morning to support patient through completion of advanced directives PMT remains available as needed  Prognosis: Poor long-term prognosis  Discharge Planning: Skilled Nursing Facility for rehab with Palliative care service follow-up  Care plan was discussed with patient's daughter   Total time: I spent 35 minutes in the care of the patient today in the above activities and documenting the encounter.         Samantha [name] Jeni Salles, PA-C  Palliative Medicine Team Team phone # [phone]  Thank you for allowing the Palliative Medicine Team to assist in the care of this patient. Please utilize secure chat with additional questions, if there is no response within 30 minutes please call the above phone number.  Palliative Medicine Team providers are available by phone from 7am to 7pm daily and can be reached through the team cell phone.  Should this patient require assistance outside of these hours, please call the patient's  attending physician.

## 2023-01-03 DIAGNOSIS — G9340 Encephalopathy, unspecified: Secondary | ICD-10-CM | POA: Diagnosis not present

## 2023-01-03 DIAGNOSIS — N179 Acute kidney failure, unspecified: Secondary | ICD-10-CM | POA: Diagnosis not present

## 2023-01-03 DIAGNOSIS — F319 Bipolar disorder, unspecified: Secondary | ICD-10-CM | POA: Diagnosis not present

## 2023-01-03 DIAGNOSIS — I509 Heart failure, unspecified: Secondary | ICD-10-CM | POA: Diagnosis not present

## 2023-01-03 LAB — BASIC METABOLIC PANEL
Anion gap: 9 (ref 5–15)
BUN: 47 mg/dL — ABNORMAL HIGH (ref 8–23)
CO2: 24 mmol/L (ref 22–32)
Calcium: 11 mg/dL — ABNORMAL HIGH (ref 8.9–10.3)
Chloride: 104 mmol/L (ref 98–111)
Creatinine, Ser: 2.56 mg/dL — ABNORMAL HIGH (ref 0.44–1.00)
GFR, Estimated: 19 mL/min — ABNORMAL LOW (ref >60)
Glucose, Bld: 91 mg/dL (ref 70–99)
Potassium: 4.2 mmol/L (ref 3.5–5.1)
Sodium: 137 mmol/L (ref 135–145)

## 2023-01-03 LAB — GLUCOSE, CAPILLARY
Glucose-Capillary: 80 mg/dL (ref 70–99)
Glucose-Capillary: 95 mg/dL (ref 70–99)
Glucose-Capillary: 154 mg/dL — ABNORMAL HIGH (ref 70–99)
Glucose-Capillary: 128 mg/dL — ABNORMAL HIGH (ref 70–99)
Glucose-Capillary: 148 mg/dL — ABNORMAL HIGH (ref 70–99)

## 2023-01-03 MED ORDER — TRAZODONE HCL 50 MG PO TABS: 50.0000 mg | ORAL_TABLET | Freq: Every evening | ORAL | Status: AC | PRN

## 2023-01-03 MED ORDER — DIVALPROEX SODIUM 125 MG PO CSDR: 125.0000 mg | DELAYED_RELEASE_CAPSULE | Freq: Two times a day (BID) | ORAL | Status: AC

## 2023-01-03 MED ORDER — PREGABALIN 25 MG PO CAPS: 25.0000 mg | ORAL_CAPSULE | Freq: Two times a day (BID) | ORAL | 0 refills | Status: AC

## 2023-01-03 NOTE — Progress Notes (Signed)
Note-discharge held as SNF unable to arrange CPAP, they will arrange CPAP tomorrow-plan is now to discharge home tomorrow.  Patient remains stable-awake/alert-see discharge summary for further details.

## 2023-01-03 NOTE — [place] FL2 (Signed)
[place] MEDICAID FL2 LEVEL OF CARE FORM     IDENTIFICATION  Patient Name: Samantha [name] Birthdate: 1945/10/12 Sex: female Admission Date (Current Location): 12/31/2022  [facility] and [place] Number:  Producer, television/film/video and Address:  [facility], [address] [place], [place] [address]      Provider Number: [id number]  Attending Physician Name and Address:  Maretta Bees, MD  Relative Name and Phone Number:       Current Level of Care: Hospital Recommended Level of Care: Skilled Nursing Facility Prior Approval Number:    Date Approved/Denied:   PASRR Number:    Discharge Plan: SNF    Current Diagnoses: Patient Active Problem List   Diagnosis Date Noted   Acute encephalopathy 12/31/2022   UTI (urinary tract infection) 12/31/2022   Hyperlipidemia 12/31/2022   CHF (congestive heart failure) (HCC) 11/04/2021   Hypertension associated with stage 3 chronic kidney disease due to type 2 diabetes mellitus (HCC) 11/04/2021   Neuropathy 11/04/2021   Metabolic bone disease 05/05/2021   Parathyroid adenoma 05/05/2021   AV block, [place]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) 02/03/2021   Hyperkalemia 02/03/2021   Type 2 diabetes mellitus with diabetic chronic kidney disease (HCC) 02/03/2021   Impacted cerumen of right ear 07/09/2016   Cough 07/07/2016   Exposure to influenza 07/07/2016   AKI (acute kidney injury) (HCC) 06/28/2016   Pulmonary edema with congestive heart failure with preserved left ventricular function (HCC) 06/25/2016   Urinary retention 06/24/2016   Abdominal distension 06/23/2016   Bradycardia 06/23/2016   Delirium 06/23/2016   Normocytic anemia 06/23/2016   Acute renal failure superimposed on stage 3 chronic kidney disease (HCC) 06/18/2016   Hypoglycemia associated with type 2 diabetes mellitus (HCC) 06/10/2016   Vitamin D deficiency 06/09/2016   At risk for falls 06/07/2016    Constipation, unspecified 06/06/2016   Hemorrhoids without complication 06/06/2016   Hx of completed stroke 06/06/2016   Hypercalcemia 06/06/2016   Mild intermittent asthma without complication 06/06/2016   Sleep apnea, unspecified 06/06/2016   Schizoaffective disorder, depressive type (HCC) 06/03/2016   Auditory hallucination    Paranoia (HCC)    Visual hallucinations    Hyperglycemia 08/31/2013   Bipolar 1 disorder, depressed (HCC) 08/29/2013   Psychosis (HCC) 08/29/2013   Bipolar 1 disorder (HCC) 08/29/2013    Orientation RESPIRATION BLADDER Height & Weight     Time, Place  Normal Continent Weight: 195 lb 15.8 oz (88.9 kg) Height:  5\' 4"  (162.6 cm)  BEHAVIORAL SYMPTOMS/MOOD NEUROLOGICAL BOWEL NUTRITION STATUS      Continent Diet (see d/c summary)  AMBULATORY STATUS COMMUNICATION OF NEEDS Skin   Extensive Assist Verbally Normal                       Personal Care Assistance Level of Assistance  Bathing, Feeding, Dressing Bathing Assistance: Maximum assistance Feeding assistance: Independent Dressing Assistance: Maximum assistance     Functional Limitations Info  Sight, Speech, Hearing Sight Info: Adequate Hearing Info: Adequate Speech Info: Adequate    SPECIAL CARE FACTORS FREQUENCY                       Contractures Contractures Info: Not present    Additional Factors Info  Allergies, Code Status Code Status Info: full code Allergies Info: Peanut-containing Drug Products, alprazolam, Cauliflower (brassica Oleracea)  Current Medications (01/03/2023):  This is the current hospital active medication list Current Facility-Administered Medications  Medication Dose Route Frequency Provider Last Rate Last Admin   acetaminophen (TYLENOL) tablet 650 mg  650 mg Oral Q6H PRN Doutova, Anastassia, MD       Or   acetaminophen (TYLENOL) suppository 650 mg  650 mg Rectal Q6H PRN Doutova, Anastassia, MD       albuterol (PROVENTIL) (2.5 MG/3ML)  0.083% nebulizer solution 3 mL  3 mL Inhalation Q6H PRN Arabella Merles, RPH       amLODipine (NORVASC) tablet 10 mg  10 mg Oral Daily Doutova, Anastassia, MD   10 mg at 01/03/23 8295   aspirin EC tablet 81 mg  81 mg Oral Daily Doutova, Anastassia, MD   81 mg at 01/03/23 0928   atorvastatin (LIPITOR) tablet 20 mg  20 mg Oral QHS Arabella Merles, RPH   20 mg at 01/02/23 2231   bisacodyl (DULCOLAX) EC tablet 5 mg  5 mg Oral Daily PRN Kc, Dayna Barker, MD       dextrose 50 % solution 50 mL  1 ampule Intravenous PRN Sundil, Subrina, MD   50 mL at 01/01/23 0411   divalproex (DEPAKOTE) DR tablet 125 mg  125 mg Oral Q12H Doutova, Anastassia, MD   125 mg at 01/03/23 6213   haloperidol (HALDOL) tablet 0.5 mg  0.5 mg Oral q AM Ghimire, Werner Lean, MD       And   haloperidol (HALDOL) tablet 0.5 mg  0.5 mg Oral QHS Ghimire, Shanker M, MD   0.5 mg at 01/02/23 2230   hydrALAZINE (APRESOLINE) tablet 100 mg  100 mg Oral TID Therisa Doyne, MD   100 mg at 01/03/23 0928   latanoprost (XALATAN) 0.005 % ophthalmic solution 1 drop  1 drop Both Eyes QHS Doutova, Anastassia, MD   1 drop at 01/02/23 2231   lidocaine (LIDODERM) 5 % 1 patch  1 patch Transdermal Q24H Kc, Ramesh, MD   1 patch at 01/02/23 1642   multivitamin (RENA-VIT) tablet 1 tablet  1 tablet Oral QHS Maretta Bees, MD   1 tablet at 01/02/23 2231   ondansetron (ZOFRAN) tablet 4 mg  4 mg Oral Q6H PRN Therisa Doyne, MD       Or   ondansetron (ZOFRAN) injection 4 mg  4 mg Intravenous Q6H PRN Doutova, Anastassia, MD       pantoprazole (PROTONIX) EC tablet 40 mg  40 mg Oral Daily Doutova, Anastassia, MD   40 mg at 01/03/23 0865   tamsulosin (FLOMAX) capsule 0.4 mg  0.4 mg Oral Daily Therisa Doyne, MD   0.4 mg at 01/03/23 7846     Discharge Medications: Please see discharge summary for a list of discharge medications.  Relevant Imaging Results:  Relevant Lab Results:   Additional Information SSN [id number], LCSW

## 2023-01-03 NOTE — Plan of Care (Signed)

## 2023-01-03 NOTE — Progress Notes (Signed)
This chaplain responded to PMT PA-Josseline consult for creating/updating the Pt. Advance Directive. The chaplain checked in with the RN-Chris before the visit. The chaplain understands the Pt. daughter-Selita prefers to be present at the bedside.  The Pt. is sleeping at the time of the visit. When Montura arrives the Pt. responds to [employer] voice and participates in AD education and clarifying questions. The chaplain understands The Pt. choice for HCPOA is [employer]. If the Pt. Choice for HCPOA is unwilling or unable to serve in this role The Pt. next choice is Eunice Blase.  The Pt. is not completing a Living Will.  The chaplain is present with the Pt., Selita, notary, and witnesses for the notarizing of the Pt. Advance Directive.  The chaplain gave the Pt. the original AD and two copies. The chaplain scanned the Pt. AD into the Pt. EMR.   This chaplain is available for F/U spiritual care as needed.  Chaplain Stephanie Acre [phone]

## 2023-01-03 NOTE — Discharge Summary (Addendum)
PATIENT DETAILS Name: Samantha [name] Age: 77 y.o. Sex: female Date of Birth: 02-23-1946 MRN: [id number]. Admitting Physician: Therisa Doyne, MD WUJ:[id number], Windy Fast, MD  Admit Date: 12/31/2022 Discharge date: 01/04/2023  Recommendations for Outpatient Follow-up:  Follow up with PCP in 1-2 weeks Please obtain CMP/CBC in one week  Admitted From:  SNF  Disposition: Skilled nursing facility   Discharge Condition: good  CODE STATUS:   Code Status: Full Code   Diet recommendation:  Diet Order             Diet - low sodium heart healthy           Diet regular Room service appropriate? Yes with Assist; Fluid consistency: Thin; Fluid restriction: 1800 mL Fluid  Diet effective now                    Brief Summary: Patient is a 77 y.o.  female with history of CKD 4, HTN, DM-2, HFpEF, OSA on CPAP, bipolar disorder, recent ESBL UTI at SNF treated with Invanz x 10 days-presenting from SNF following a unwitnessed fall and confusion.     Significant events: 8/18>> admit to [facility]   Significant studies: 8/18>> CXR: No PNA 8/18>> x-ray pelvis: No osseous injury 8/18 >>x-ray left pelvis: No osseous injury 8/18>> CT head: No acute abnormality 8/18>> CT C-spine: No fracture 8/18>> CT chest/abdomen/pelvis: No acute findings chest/abdomen/pelvis 8/18>> x-ray left shoulder: No acute osseous injury 8/18>> MRI brain: No acute abnormality 8/19>> NH4: 17 8/19>> echo: EF> 75% 8/20>> Spot EEG: No seizures   Significant microbiology data: 8/18>> blood culture: No growth 8/18>> urine culture: No growth 8/18>> COVID PCR: Negative   Procedures: None   Consults: Palliative care  Brief Hospital Course: Acute metabolic encephalopathy Unclear etiology but possibly could be from AKI-and probably from psych meds. Underwent extensive workup-see above Managed with supportive care-improved-she is completely awake and alert this morning   Recent ESBL E. coli UTI while at  SNF Apparently completed 10 days of Invanz Afebrile-not felt to have ongoing UTI-continue to watch off antibiotics.   AKI on CKD 4 AKI likely hemodynamically mediated AKI improved-creatinine back to baseline. Avoid nephrotoxic agents.   Chronic HFpEF Euvolemic   HTN BP on the higher side Resume amlodipine/hydralazine/Coreg on discharge   HLD Statin   Bipolar disorder/schizoaffective disorder Haldol doses was slightly reduced-but she has significantly improved-will resume usual dosing of Depakote and Haldol on discharge.   Peripheral neuropathy Resume Lyrica-dose reduced on discharge due to concern for contribution and encephalopathy.   OSA CPAP nightly ( autotitrate settings in the hospital)   Fall at [facility] Apparently slipped off the bed-no syncopal episode PT/OT eval-back to SNF. Imaging studies negative for any serious injuries-see above.   Obesity: Estimated body mass index is 33.64 kg/m as calculated from the following:   Height as of this encounter: 5\' 4"  (1.626 m).   Weight as of this encounter: 88.9 kg.   Nutrition Status: Nutrition Problem: Increased nutrient needs Etiology: chronic illness Signs/Symptoms: estimated needs Interventions: Liberalize Diet    Discharge Diagnoses:  Principal Problem:   Acute encephalopathy Active Problems:   Bipolar 1 disorder, depressed (HCC)   Acute renal failure superimposed on stage 3 chronic kidney disease (HCC)   AV block, Mobitz 1   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)   CHF (congestive heart failure) (HCC)   Hypertension associated with stage 3 chronic kidney disease due to type 2 diabetes mellitus (HCC)   Sleep apnea,  unspecified   Type 2 diabetes mellitus with diabetic chronic kidney disease (HCC)   UTI (urinary tract infection)   Hyperlipidemia   Discharge Instructions:  Activity:  As tolerated with Full fall precautions use walker/cane & assistance as needed  Discharge  Instructions     Call MD for:  extreme fatigue   Complete by: As directed    Call MD for:  persistant dizziness or light-headedness   Complete by: As directed    Diet - low sodium heart healthy   Complete by: As directed    Increase activity slowly   Complete by: As directed       Allergies as of 01/04/2023       Reactions   Peanut-containing Drug Products Anaphylaxis, Swelling   Alprazolam Other (See Comments)   weakness   Cauliflower [brassica Oleracea]    Not listed on the [facility]        Medication List     TAKE these medications    acetaminophen 500 MG tablet Commonly known as: TYLENOL Take 500 mg by mouth in the morning, at noon, and at bedtime.   albuterol (2.5 MG/3ML) 0.083% nebulizer solution Commonly known as: PROVENTIL Take 2.5 mg by nebulization every 6 (six) hours as needed for wheezing or shortness of breath. What changed: Another medication with the same name was changed. Make sure you understand how and when to take each.   albuterol 108 (90 Base) MCG/ACT inhaler Commonly known as: VENTOLIN HFA Inhale 2 puffs into the lungs every 6 (six) hours as needed for wheezing. What changed:  when to take this reasons to take this   amLODipine 10 MG tablet Commonly known as: NORVASC Take 10 mg by mouth daily.   aspirin EC 81 MG tablet Take 81 mg by mouth daily.   atorvastatin 20 MG tablet Commonly known as: LIPITOR Take 1 tablet by mouth at bedtime.   bisacodyl 5 MG EC tablet Take 5 mg by mouth daily as needed for moderate constipation.   budesonide-formoterol 160-4.5 MCG/ACT inhaler Commonly known as: SYMBICORT Inhale 2 puffs into the lungs 2 (two) times daily.   capsaicin 0.025 % cream Commonly known as: ZOSTRIX Apply 1 Application topically at bedtime.   carvedilol 12.5 MG tablet Commonly known as: COREG Take 12.5 mg by mouth 2 (two) times daily.   cetirizine 10 MG tablet Commonly known as: ZYRTEC Take 10 mg by mouth daily.   cinacalcet 90  MG tablet Commonly known as: SENSIPAR Take 90 mg by mouth daily.   divalproex 125 MG capsule Commonly known as: DEPAKOTE SPRINKLE Take 1 capsule (125 mg total) by mouth 2 (two) times daily. What changed: when to take this   EPINEPHrine 0.3 mg/0.3 mL Soaj injection Commonly known as: EPI-PEN Inject 0.3 mg into the muscle as needed for anaphylaxis.   fluticasone 50 MCG/ACT nasal spray Commonly known as: FLONASE Place 1 spray into both nostrils daily.   haloperidol 0.5 MG tablet Commonly known as: HALDOL Take 0.5-1 mg by mouth See admin instructions. Take 1 tablet by mouth in the morning and 2 tablets at bedtime   hydrALAZINE 100 MG tablet Commonly known as: APRESOLINE Take 100 mg by mouth 3 (three) times daily.   latanoprost 0.005 % ophthalmic solution Commonly known as: XALATAN Place 1 drop into both eyes nightly.   lidocaine 4 % Place 1 patch onto the skin daily.   magnesium oxide 400 MG tablet Commonly known as: MAG-OX Take 1 tablet by mouth 2 (two) times daily.  methocarbamol 500 MG tablet Commonly known as: ROBAXIN Take 500 mg by mouth every 8 (eight) hours as needed for muscle spasms.   mirabegron ER 25 MG Tb24 tablet Commonly known as: MYRBETRIQ Take 25 mg by mouth daily.   Misc. Devices Misc New CPAP mask and supplies per patient preference for OSA.   omeprazole 40 MG capsule Commonly known as: PRILOSEC Take 40 mg by mouth daily.   ondansetron 4 MG tablet Commonly known as: ZOFRAN Take 4 mg by mouth every 8 (eight) hours as needed for nausea or vomiting.   Ozempic (0.25 or 0.5 MG/DOSE) 2 MG/3ML Sopn Generic drug: Semaglutide(0.25 or 0.5MG /DOS) Inject 0.5 mg into the skin every Wednesday.   polyethylene glycol 17 g packet Commonly known as: MIRALAX / GLYCOLAX Take 17 g by mouth daily.   pregabalin 25 MG capsule Commonly known as: LYRICA Take 1 capsule (25 mg total) by mouth 2 (two) times daily. What changed:  medication strength how much to  take   sennosides-docusate sodium 8.6-50 MG tablet Commonly known as: SENOKOT-S Take 2 tablets by mouth in the morning and at bedtime.   Systane Balance 0.6 % Soln Generic drug: Propylene Glycol Place 1 drop into both eyes 3 times daily.   SYSTANE OP Place 1 drop into both eyes in the morning, at noon, and at bedtime.   tamsulosin 0.4 MG Caps capsule Commonly known as: FLOMAX Take 1 capsule by mouth daily.   traZODone 50 MG tablet Commonly known as: DESYREL Take 1 tablet (50 mg total) by mouth at bedtime as needed for sleep. What changed:  when to take this reasons to take this   trimethoprim 100 MG tablet Commonly known as: TRIMPEX Take 50 mg by mouth daily.        Follow-up Information     Renford Dills, MD. Schedule an appointment as soon as possible for a visit in 1 week(s).   Specialty: Internal Medicine Contact information: [address] [place] [address] [phone]                Allergies  Allergen Reactions   Peanut-Containing Drug Products Anaphylaxis and Swelling   Alprazolam Other (See Comments)    weakness   Cauliflower [Brassica Oleracea]     Not listed on the [facility]     Other Procedures/Studies: EEG adult  Result Date: 01-27-23 Charlsie Quest, MD     01/27/23 12:48 PM Patient Name: Averee Hochhalter MRN: [id number] Epilepsy Attending: Charlsie Quest Referring Physician/Provider: Maretta Bees, MD Date: January 27, 2023 Duration: 24.50 mins Patient history: 77yo F with ams getting eeg to evaluate for seizure. Level of alertness: Awake, asleep AEDs during EEG study: VPA Technical aspects: This EEG study was done with scalp electrodes positioned according to the 10-20 International system of electrode placement. Electrical activity was reviewed with band pass filter of 1-70Hz , sensitivity of 7 uV/mm, display speed of 82mm/sec with a 60Hz  notched filter applied as appropriate. EEG data were recorded continuously and  digitally stored.  Video monitoring was available and reviewed as appropriate. Description: No posterior dominant rhythm was seen. Sleep was characterized by vertex waves, sleep spindles (12 to 14 Hz), maximal frontocentral region. EEG showed continuous generalized 3 to 5 Hz theta-delta slowing. Hyperventilation and photic stimulation were not performed.   ABNORMALITY - Continuous slow, generalized IMPRESSION: This study is suggestive of moderate diffuse encephalopathy, nonspecific etiology. No seizures or epileptiform discharges were seen throughout the recording. Priyanka Annabelle Harman   ECHOCARDIOGRAM COMPLETE  Result Date: 01/01/2023    ECHOCARDIOGRAM REPORT   Patient Name:   KRUZ ABONCE Date of Exam: 01/01/2023 Medical Rec #:  [id number]       Height:       64.0 in Accession #:    [id number]      Weight:       196.0 lb Date of Birth:  [birth year]        BSA:          1.940 m Patient Age:    77 years        BP:           140/67 mmHg Patient Gender: F               HR:           81 bpm. Exam Location:  Inpatient Procedure: 2D Echo, Color Doppler and Cardiac Doppler Indications:    R06.9 DOE  History:        Patient has no prior history of Echocardiogram examinations.                 CHF; Risk Factors:Hypertension, Diabetes, Dyslipidemia, Sleep                 Apnea and CKD.  Sonographer:    Irving Burton Senior RDCS Referring Phys: Consepcion Hearing ANASTASSIA DOUTOVA IMPRESSIONS  1. Left ventricular ejection fraction, by estimation, is >75%. Left ventricular ejection fraction by PLAX is 73 %. The left ventricle has hyperdynamic function. The left ventricle has no regional wall motion abnormalities. There is moderate concentric left ventricular hypertrophy of the septal segment. Indeterminate diastolic filling due to E-A fusion.  2. Right ventricular systolic function is normal. The right ventricular size is normal. There is normal pulmonary artery systolic pressure. The estimated right ventricular systolic pressure is 29.0 mmHg.  3.  The mitral valve is normal in structure. No evidence of mitral valve regurgitation. No evidence of mitral stenosis.  4. The aortic valve is tricuspid. Aortic valve regurgitation is not visualized. No aortic stenosis is present.  5. The inferior vena cava is normal in size with greater than 50% respiratory variability, suggesting right atrial pressure of 3 mmHg. Comparison(s): No prior Echocardiogram. FINDINGS  Left Ventricle: Left ventricular ejection fraction, by estimation, is >75%. Left ventricular ejection fraction by PLAX is 73 %. The left ventricle has hyperdynamic function. The left ventricle has no regional wall motion abnormalities. The left ventricular internal cavity size was normal in size. There is moderate concentric left ventricular hypertrophy of the septal segment. Indeterminate diastolic filling due to E-A fusion. Right Ventricle: The right ventricular size is normal. Right ventricular systolic function is normal. There is normal pulmonary artery systolic pressure. The tricuspid regurgitant velocity is 2.55 m/s, and with an assumed right atrial pressure of 3 mmHg,  the estimated right ventricular systolic pressure is 29.0 mmHg. Left Atrium: Left atrial size was normal in size. Right Atrium: Right atrial size was normal in size. Pericardium: Trivial pericardial effusion is present. Presence of epicardial fat layer. Mitral Valve: The mitral valve is normal in structure. No evidence of mitral valve regurgitation. No evidence of mitral valve stenosis. Tricuspid Valve: The tricuspid valve is normal in structure. Tricuspid valve regurgitation is mild . No evidence of tricuspid stenosis. Aortic Valve: The aortic valve is tricuspid. Aortic valve regurgitation is not visualized. No aortic stenosis is present. Pulmonic Valve: The pulmonic valve was normal in structure. Pulmonic valve regurgitation is not visualized. No evidence of pulmonic stenosis. Aorta:  The aortic root and ascending aorta are structurally  normal, with no evidence of dilitation. Venous: The inferior vena cava is normal in size with greater than 50% respiratory variability, suggesting right atrial pressure of 3 mmHg. IAS/Shunts: No atrial level shunt detected by color flow Doppler.  LEFT VENTRICLE PLAX 2D LV EF:         Left ventricular ejection fraction by PLAX is 73 %. LVIDd:         2.70 cm LVIDs:         1.60 cm LV PW:         1.10 cm LV IVS:        1.40 cm LVOT diam:     1.90 cm LV SV:         58 LV SV Index:   30 LVOT Area:     2.84 cm  RIGHT VENTRICLE RV S prime:     8.92 cm/s TAPSE (M-mode): 2.0 cm LEFT ATRIUM             Index        RIGHT ATRIUM           Index LA diam:        2.20 cm 1.13 cm/m   RA Area:     16.10 cm LA Vol (A2C):   33.8 ml 17.43 ml/m  RA Volume:   37.60 ml  19.39 ml/m LA Vol (A4C):   36.7 ml 18.92 ml/m LA Biplane Vol: 36.9 ml 19.02 ml/m  AORTIC VALVE LVOT Vmax:   101.00 cm/s LVOT Vmean:  73.900 cm/s LVOT VTI:    0.205 m  AORTA Ao Root diam: 2.80 cm Ao Asc diam:  3.50 cm TRICUSPID VALVE TR Peak grad:   26.0 mmHg TR Vmax:        255.00 cm/s  SHUNTS Systemic VTI:  0.20 m Systemic Diam: 1.90 cm Olga Millers MD Electronically signed by Olga Millers MD Signature Date/Time: 01/01/2023/9:47:57 AM    Final    MR Cervical Spine Wo Contrast  Result Date: 12/31/2022 CLINICAL DATA:  Initial evaluation for acute trauma, fall. EXAM: MRI CERVICAL SPINE WITHOUT CONTRAST TECHNIQUE: Multiplanar, multisequence MR imaging of the cervical spine was performed. No intravenous contrast was administered. COMPARISON:  Prior CT from earlier the same day. FINDINGS: Alignment: Straightening with mild reversal of the normal cervical lordosis. Trace degenerative retrolisthesis of C5 on C6. Vertebrae: Vertebral body height maintained without acute or chronic fracture. Bone marrow signal intensity within normal limits. No discrete or worrisome osseous lesions. No abnormal marrow edema. Cord: Normal signal and morphology. No evidence for  ligamentous injury. Posterior Fossa, vertebral arteries, paraspinal tissues: Craniocervical junction normal. Small focus of mild edema present within the posterior paraspinous musculature at the level of C4-5 (series 8, image 8, 12, likely mild muscular injury/strain in the setting of trauma. Paraspinous soft tissues demonstrate no other acute finding. Normal flow voids seen within the vertebral arteries bilaterally. Disc levels: C2-C3: Mild disc bulge with uncovertebral hypertrophy. Mild right-sided facet hypertrophy. No significant spinal stenosis. Foramina remain adequately patent. C3-C4: Mild uncovertebral spurring without significant disc bulge. Mild facet hypertrophy, greater on the right. No spinal stenosis. Foramina remain patent. C4-C5: Degenerate intervertebral disc space narrowing with circumferential disc osteophyte complex. Posterior component flattens and partially effaces the ventral thecal sac. Mild cord flattening without cord signal changes. Mild spinal stenosis. Mild right with mild to moderate left C5 foraminal stenosis. C5-C6: Degenerative intervertebral disc space narrowing with circumferential disc osteophyte complex. Broad posterior  component flattens and effaces the ventral thecal sac. Superimposed mild facet and ligament flavum hypertrophy. Mild cord flattening without cord signal changes. Moderate spinal stenosis with severe bilateral C6 foraminal narrowing. C6-C7: Mild disc bulge with uncovertebral spurring. Mild facet hypertrophy. No spinal stenosis. Foramina remain patent. C7-T1: Negative interspace. Mild facet hypertrophy. No spinal stenosis. Foramina remain patent. IMPRESSION: 1. Mild edema within the posterior paraspinous musculature at the level of C4-5, likely mild muscular injury/strain in the setting of trauma. 2. No other evidence for acute traumatic injury within the cervical spine. No evidence for traumatic cord or ligamentous injury. 3. Multilevel cervical spondylosis with  resultant mild to moderate spinal stenosis at C4-5 and C5-6. Associated mild to moderate left C5 foraminal narrowing, with severe bilateral C6 foraminal stenosis. Electronically Signed   By: Rise Mu M.D.   On: 12/31/2022 21:51   MR BRAIN WO CONTRAST  Result Date: 12/31/2022 CLINICAL DATA:  Initial evaluation for mental status change, unknown cause. EXAM: MRI HEAD WITHOUT CONTRAST TECHNIQUE: Multiplanar, multiecho pulse sequences of the brain and surrounding structures were obtained without intravenous contrast. COMPARISON:  Prior CT from earlier the same day. FINDINGS: Brain: Diffuse prominence of the CSF containing spaces compatible generalized cerebral atrophy. Patchy T2/FLAIR hyperintensity involving the periventricular deep white matter, consistent with chronic small vessel ischemic disease, mild for age. Encephalomalacia and gliosis involving the right occipital lobe consistent with a chronic right PCA distribution infarct. No evidence for acute or subacute ischemia. Gray-white matter differentiation otherwise maintained. No other as a chronic cortical infarction. No acute intracranial hemorrhage. Single punctate chronic microhemorrhage noted at the left external capsule. No mass lesion, midline shift or mass effect. No hydrocephalus or extra-axial fluid collection. Pituitary gland suprasellar region within normal limits. Vascular: Major intracranial vascular flow voids are maintained. Skull and upper cervical spine: Craniocervical junction within normal limits. Bone marrow signal intensity normal. No scalp soft tissue abnormality. Sinuses/Orbits: Prior bilateral ocular lens replacement. Scattered mucosal thickening noted throughout the paranasal sinuses, most pronounced at the frontoethmoidal region. No mastoid effusion. Other: None. IMPRESSION: 1. No acute intracranial abnormality. 2. Chronic right PCA distribution infarct. 3. Underlying age-related cerebral atrophy with mild chronic small  vessel ischemic disease. Electronically Signed   By: Rise Mu M.D.   On: 12/31/2022 21:37   DG Shoulder Left  Result Date: 12/31/2022 CLINICAL DATA:  Status post fall, left shoulder pain EXAM: LEFT SHOULDER - 2+ VIEW COMPARISON:  None Available. FINDINGS: No fracture or dislocation. Generalized osteopenia. Mild degenerative changes of the acromioclavicular joint. Soft tissues are normal. IMPRESSION: 1. No acute osseous injury of the left shoulder. Electronically Signed   By: Elige Ko M.D.   On: 12/31/2022 18:35   CT L-SPINE NO CHARGE  Result Date: 12/31/2022 CLINICAL DATA:  Fall. EXAM: CT THORACIC AND LUMBAR SPINE WITHOUT CONTRAST TECHNIQUE: Multi planar CT imaging of the thoracic and lumbar spine were reconstructed from CT chest, abdomen and pelvis without contrast. RADIATION DOSE REDUCTION: This exam was performed according to the departmental dose-optimization program which includes automated exposure control, adjustment of the mA and/or kV according to patient size and/or use of iterative reconstruction technique. COMPARISON:  None Available. FINDINGS: CT THORACIC SPINE FINDINGS Alignment: Normal. Vertebrae: Normal vertebral body heights. No acute fracture or suspicious bone lesions. Paraspinal and other soft tissues: Unremarkable. Disc levels: Mild degenerative changes of the midthoracic spine. No spinal canal stenosis or neural foraminal narrowing. CT LUMBAR SPINE FINDINGS Segmentation: Conventional numbering is assumed with 5 non-rib-bearing, lumbar type vertebral bodies.  Alignment: Normal. Vertebrae: Normal vertebral body heights. No acute fracture or suspicious bone lesion. Multilevel degenerative endplate changes. Paraspinal and other soft tissues: Unremarkable. Disc levels: Multilevel lumbar spondylosis, worst at L3-4 and L4-5, where there is at least mild spinal canal stenosis. IMPRESSION: 1. No acute fracture or traumatic listhesis of the thoracic or lumbar spine. 2. Multilevel  lumbar spondylosis, worst at L3-4 and L4-5, where there is at least mild spinal canal stenosis. Electronically Signed   By: Orvan Falconer M.D.   On: 12/31/2022 16:19   CT T-SPINE NO CHARGE  Result Date: 12/31/2022 CLINICAL DATA:  Fall. EXAM: CT THORACIC AND LUMBAR SPINE WITHOUT CONTRAST TECHNIQUE: Multi planar CT imaging of the thoracic and lumbar spine were reconstructed from CT chest, abdomen and pelvis without contrast. RADIATION DOSE REDUCTION: This exam was performed according to the departmental dose-optimization program which includes automated exposure control, adjustment of the mA and/or kV according to patient size and/or use of iterative reconstruction technique. COMPARISON:  None Available. FINDINGS: CT THORACIC SPINE FINDINGS Alignment: Normal. Vertebrae: Normal vertebral body heights. No acute fracture or suspicious bone lesions. Paraspinal and other soft tissues: Unremarkable. Disc levels: Mild degenerative changes of the midthoracic spine. No spinal canal stenosis or neural foraminal narrowing. CT LUMBAR SPINE FINDINGS Segmentation: Conventional numbering is assumed with 5 non-rib-bearing, lumbar type vertebral bodies. Alignment: Normal. Vertebrae: Normal vertebral body heights. No acute fracture or suspicious bone lesion. Multilevel degenerative endplate changes. Paraspinal and other soft tissues: Unremarkable. Disc levels: Multilevel lumbar spondylosis, worst at L3-4 and L4-5, where there is at least mild spinal canal stenosis. IMPRESSION: 1. No acute fracture or traumatic listhesis of the thoracic or lumbar spine. 2. Multilevel lumbar spondylosis, worst at L3-4 and L4-5, where there is at least mild spinal canal stenosis. Electronically Signed   By: Orvan Falconer M.D.   On: 12/31/2022 16:19   CT HEAD WO CONTRAST  Result Date: 12/31/2022 CLINICAL DATA:  Head trauma, moderate-severe; Polytrauma, blunt. Fall. EXAM: CT HEAD WITHOUT CONTRAST CT CERVICAL SPINE WITHOUT CONTRAST CT CHEST,  ABDOMEN AND PELVIS WITHOUT CONTRAST TECHNIQUE: Contiguous axial images were obtained from the base of the skull through the vertex without intravenous contrast. Multidetector CT imaging of the cervical spine was performed without intravenous contrast. Multiplanar CT image reconstructions were also generated. Multidetector CT imaging of the chest, abdomen and pelvis was performed following the standard protocol without IV contrast. RADIATION DOSE REDUCTION: This exam was performed according to the departmental dose-optimization program which includes automated exposure control, adjustment of the mA and/or kV according to patient size and/or use of iterative reconstruction technique. COMPARISON:  Head CT 01/25/2019.  CT abdomen/pelvis 09/12/2015. FINDINGS: CT HEAD FINDINGS Brain: No acute hemorrhage. 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 Cortical gray-white differentiation is otherwise preserved. Background of mild chronic small-vessel disease. No hydrocephalus or extra-axial collection. No mass effect or midline shift. Vascular: No hyperdense vessel or unexpected calcification. Skull: No calvarial fracture or suspicious bone lesion. Skull base is unremarkable. Sinuses/Orbits: Mild paranasal sinus disease. Orbits are unremarkable. Other: None. CT CERVICAL FINDINGS Alignment: Normal. Skull base and vertebrae: No acute fracture. Normal craniocervical junction. No suspicious bone lesions. Soft tissues and spinal canal: No prevertebral fluid or swelling. No visible canal hematoma. Disc levels: Mild cervical spondylosis without high-grade spinal canal stenosis. Other: Atherosclerotic calcifications of the carotid bulbs. CT CHEST FINDINGS Cardiovascular: No acute vascular findings. Normal heart size. No pericardial effusion. Severe coronary artery calcifications. Atherosclerotic calcifications of the thoracic aorta. Mediastinum/Nodes: No enlarged mediastinal, hilar, or  axillary lymph nodes. Thyroid gland, trachea, and  esophagus demonstrate no significant findings. Lungs/Pleura: Subsegmental atelectasis in the lung bases. No consolidation, pulmonary edema or traumatic findings. No pleural effusion or pneumothorax. Musculoskeletal: No chest wall mass or suspicious bone lesions identified. CT ABDOMEN AND PELVIS FINDINGS Hepatobiliary: No hepatic injury or perihepatic hematoma. Gallbladder is unremarkable. Pancreas: Unremarkable. No pancreatic ductal dilatation or surrounding inflammatory changes. Spleen: No splenic injury or perisplenic hematoma. Adrenals/Urinary Tract: No adrenal hemorrhage or renal injury identified. Bladder is unremarkable. Stomach/Bowel: Stomach is within normal limits. Appendix appears normal. No evidence of bowel wall thickening, distention, or inflammatory changes. Vascular/Lymphatic: Aortic atherosclerosis. No enlarged abdominal or pelvic lymph nodes. Reproductive: Calcified uterine fibroids.  No adnexal masses. Other: No abdominal wall hernia or abnormality. No abdominopelvic ascites. Musculoskeletal: No fracture is seen. IMPRESSION: 1. No acute intracranial abnormality. 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 2. No acute fracture or traumatic listhesis of the cervical spine. 3. No acute traumatic findings in the chest, abdomen, or pelvis. 4. Coronary artery disease. Aortic Atherosclerosis (ICD10-I70.0). Electronically Signed   By: Orvan Falconer M.D.   On: 12/31/2022 16:14   CT CERVICAL SPINE WO CONTRAST  Result Date: 12/31/2022 CLINICAL DATA:  Head trauma, moderate-severe; Polytrauma, blunt. Fall. EXAM: CT HEAD WITHOUT CONTRAST CT CERVICAL SPINE WITHOUT CONTRAST CT CHEST, ABDOMEN AND PELVIS WITHOUT CONTRAST TECHNIQUE: Contiguous axial images were obtained from the base of the skull through the vertex without intravenous contrast. Multidetector CT imaging of the cervical spine was performed without intravenous contrast. Multiplanar CT image reconstructions were also generated. Multidetector CT  imaging of the chest, abdomen and pelvis was performed following the standard protocol without IV contrast. RADIATION DOSE REDUCTION: This exam was performed according to the departmental dose-optimization program which includes automated exposure control, adjustment of the mA and/or kV according to patient size and/or use of iterative reconstruction technique. COMPARISON:  Head CT 01/25/2019.  CT abdomen/pelvis 09/12/2015. FINDINGS: CT HEAD FINDINGS Brain: No acute hemorrhage. 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 Cortical gray-white differentiation is otherwise preserved. Background of mild chronic small-vessel disease. No hydrocephalus or extra-axial collection. No mass effect or midline shift. Vascular: No hyperdense vessel or unexpected calcification. Skull: No calvarial fracture or suspicious bone lesion. Skull base is unremarkable. Sinuses/Orbits: Mild paranasal sinus disease. Orbits are unremarkable. Other: None. CT CERVICAL FINDINGS Alignment: Normal. Skull base and vertebrae: No acute fracture. Normal craniocervical junction. No suspicious bone lesions. Soft tissues and spinal canal: No prevertebral fluid or swelling. No visible canal hematoma. Disc levels: Mild cervical spondylosis without high-grade spinal canal stenosis. Other: Atherosclerotic calcifications of the carotid bulbs. CT CHEST FINDINGS Cardiovascular: No acute vascular findings. Normal heart size. No pericardial effusion. Severe coronary artery calcifications. Atherosclerotic calcifications of the thoracic aorta. Mediastinum/Nodes: No enlarged mediastinal, hilar, or axillary lymph nodes. Thyroid gland, trachea, and esophagus demonstrate no significant findings. Lungs/Pleura: Subsegmental atelectasis in the lung bases. No consolidation, pulmonary edema or traumatic findings. No pleural effusion or pneumothorax. Musculoskeletal: No chest wall mass or suspicious bone lesions identified. CT ABDOMEN AND PELVIS FINDINGS Hepatobiliary: No  hepatic injury or perihepatic hematoma. Gallbladder is unremarkable. Pancreas: Unremarkable. No pancreatic ductal dilatation or surrounding inflammatory changes. Spleen: No splenic injury or perisplenic hematoma. Adrenals/Urinary Tract: No adrenal hemorrhage or renal injury identified. Bladder is unremarkable. Stomach/Bowel: Stomach is within normal limits. Appendix appears normal. No evidence of bowel wall thickening, distention, or inflammatory changes. Vascular/Lymphatic: Aortic atherosclerosis. No enlarged abdominal or pelvic lymph nodes. Reproductive: Calcified uterine fibroids.  No adnexal masses.  Other: No abdominal wall hernia or abnormality. No abdominopelvic ascites. Musculoskeletal: No fracture is seen. IMPRESSION: 1. No acute intracranial abnormality. 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 2. No acute fracture or traumatic listhesis of the cervical spine. 3. No acute traumatic findings in the chest, abdomen, or pelvis. 4. Coronary artery disease. Aortic Atherosclerosis (ICD10-I70.0). Electronically Signed   By: Orvan Falconer M.D.   On: 12/31/2022 16:14   CT CHEST ABDOMEN PELVIS WO CONTRAST  Result Date: 12/31/2022 CLINICAL DATA:  Head trauma, moderate-severe; Polytrauma, blunt. Fall. EXAM: CT HEAD WITHOUT CONTRAST CT CERVICAL SPINE WITHOUT CONTRAST CT CHEST, ABDOMEN AND PELVIS WITHOUT CONTRAST TECHNIQUE: Contiguous axial images were obtained from the base of the skull through the vertex without intravenous contrast. Multidetector CT imaging of the cervical spine was performed without intravenous contrast. Multiplanar CT image reconstructions were also generated. Multidetector CT imaging of the chest, abdomen and pelvis was performed following the standard protocol without IV contrast. RADIATION DOSE REDUCTION: This exam was performed according to the departmental dose-optimization program which includes automated exposure control, adjustment of the mA and/or kV according to patient size and/or  use of iterative reconstruction technique. COMPARISON:  Head CT 01/25/2019.  CT abdomen/pelvis 09/12/2015. FINDINGS: CT HEAD FINDINGS Brain: No acute hemorrhage. 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 Cortical gray-white differentiation is otherwise preserved. Background of mild chronic small-vessel disease. No hydrocephalus or extra-axial collection. No mass effect or midline shift. Vascular: No hyperdense vessel or unexpected calcification. Skull: No calvarial fracture or suspicious bone lesion. Skull base is unremarkable. Sinuses/Orbits: Mild paranasal sinus disease. Orbits are unremarkable. Other: None. CT CERVICAL FINDINGS Alignment: Normal. Skull base and vertebrae: No acute fracture. Normal craniocervical junction. No suspicious bone lesions. Soft tissues and spinal canal: No prevertebral fluid or swelling. No visible canal hematoma. Disc levels: Mild cervical spondylosis without high-grade spinal canal stenosis. Other: Atherosclerotic calcifications of the carotid bulbs. CT CHEST FINDINGS Cardiovascular: No acute vascular findings. Normal heart size. No pericardial effusion. Severe coronary artery calcifications. Atherosclerotic calcifications of the thoracic aorta. Mediastinum/Nodes: No enlarged mediastinal, hilar, or axillary lymph nodes. Thyroid gland, trachea, and esophagus demonstrate no significant findings. Lungs/Pleura: Subsegmental atelectasis in the lung bases. No consolidation, pulmonary edema or traumatic findings. No pleural effusion or pneumothorax. Musculoskeletal: No chest wall mass or suspicious bone lesions identified. CT ABDOMEN AND PELVIS FINDINGS Hepatobiliary: No hepatic injury or perihepatic hematoma. Gallbladder is unremarkable. Pancreas: Unremarkable. No pancreatic ductal dilatation or surrounding inflammatory changes. Spleen: No splenic injury or perisplenic hematoma. Adrenals/Urinary Tract: No adrenal hemorrhage or renal injury identified. Bladder is unremarkable. Stomach/Bowel:  Stomach is within normal limits. Appendix appears normal. No evidence of bowel wall thickening, distention, or inflammatory changes. Vascular/Lymphatic: Aortic atherosclerosis. No enlarged abdominal or pelvic lymph nodes. Reproductive: Calcified uterine fibroids.  No adnexal masses. Other: No abdominal wall hernia or abnormality. No abdominopelvic ascites. Musculoskeletal: No fracture is seen. IMPRESSION: 1. No acute intracranial abnormality. 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 2. No acute fracture or traumatic listhesis of the cervical spine. 3. No acute traumatic findings in the chest, abdomen, or pelvis. 4. Coronary artery disease. Aortic Atherosclerosis (ICD10-I70.0). Electronically Signed   By: Orvan Falconer M.D.   On: 12/31/2022 16:14   DG Elbow Complete Left  Result Date: 12/31/2022 CLINICAL DATA:  Status post fall, left elbow pain EXAM: LEFT ELBOW - COMPLETE 3+ VIEW COMPARISON:  None Available. FINDINGS: There is no evidence of fracture, dislocation, or joint effusion. There is no evidence of arthropathy or other focal bone  abnormality. Soft tissues are unremarkable. IMPRESSION: Negative. Electronically Signed   By: Elige Ko M.D.   On: 12/31/2022 15:05   DG Pelvis Portable  Result Date: 12/31/2022 CLINICAL DATA:  Status post fall, trauma EXAM: PORTABLE PELVIS 1-2 VIEWS COMPARISON:  None Available. FINDINGS: No acute fracture or dislocation. No aggressive osseous lesion. Normal alignment. Generalized osteopenia. Lower lumbar spine spondylosis. Calcified right uterine fibroid. Peripheral vascular atherosclerotic disease. No radiopaque foreign body or soft tissue emphysema. IMPRESSION: 1. No acute osseous injury of the pelvis. If there is further clinical concern, recommend a CT of the pelvis. Electronically Signed   By: Elige Ko M.D.   On: 12/31/2022 15:04   DG Chest Port 1 View  Result Date: 12/31/2022 CLINICAL DATA:  Status post fall, trauma EXAM: PORTABLE CHEST 1 VIEW COMPARISON:   06/04/2020 FINDINGS: Mild lingular scarring versus atelectasis. No focal consolidation. No pleural effusion or pneumothorax. Heart and mediastinal contours are unremarkable. No acute osseous abnormality. IMPRESSION: No active disease. Electronically Signed   By: Elige Ko M.D.   On: 12/31/2022 15:02     TODAY-DAY OF DISCHARGE:  Subjective:   Samantha [name] today has no headache,no chest abdominal pain,no new weakness tingling or numbness, feels much better wants to go home today.   Objective:   Blood pressure 133/64, pulse 73, temperature 98.1 F (36.7 C), temperature source Oral, resp. rate (!) 22, height 5\' 4"  (1.626 m), weight 88.9 kg, SpO2 98%.  Intake/Output Summary (Last 24 hours) at 01/04/2023 0704 Last data filed at 01/04/2023 0612 Gross per 24 hour  Intake 960 ml  Output 1000 ml  Net -40 ml   Filed Weights   12/31/22 2300  Weight: 88.9 kg    Exam: Awake Alert, Oriented *3, No new F.N deficits, Normal affect [place].AT,PERRAL Supple Neck,No JVD, No cervical lymphadenopathy appriciated.  Symmetrical Chest wall movement, Good air movement bilaterally, CTAB RRR,No Gallops,Rubs or new Murmurs, No Parasternal Heave +ve B.Sounds, Abd Soft, Non tender, No organomegaly appriciated, No rebound -guarding or rigidity. No Cyanosis, Clubbing or edema, No new Rash or bruise   PERTINENT RADIOLOGIC STUDIES: EEG adult  Result Date: Jan 28, 2023 Charlsie Quest, MD     28-Jan-2023 12:48 PM Patient Name: Dyanara Bennion MRN: [id number] Epilepsy Attending: Charlsie Quest Referring Physician/Provider: Maretta Bees, MD Date: 01/28/2023 Duration: 24.50 mins Patient history: 78yo F with ams getting eeg to evaluate for seizure. Level of alertness: Awake, asleep AEDs during EEG study: VPA Technical aspects: This EEG study was done with scalp electrodes positioned according to the 10-20 International system of electrode placement. Electrical activity was reviewed with band pass filter of 1-70Hz ,  sensitivity of 7 uV/mm, display speed of 59mm/sec with a 60Hz  notched filter applied as appropriate. EEG data were recorded continuously and digitally stored.  Video monitoring was available and reviewed as appropriate. Description: No posterior dominant rhythm was seen. Sleep was characterized by vertex waves, sleep spindles (12 to 14 Hz), maximal frontocentral region. EEG showed continuous generalized 3 to 5 Hz theta-delta slowing. Hyperventilation and photic stimulation were not performed.   ABNORMALITY - Continuous slow, generalized IMPRESSION: This study is suggestive of moderate diffuse encephalopathy, nonspecific etiology. No seizures or epileptiform discharges were seen throughout the recording. Priyanka O Yadav     PERTINENT LAB RESULTS: CBC: Recent Labs    01-28-2023 0615  WBC 9.8  HGB 10.3*  HCT 33.1*  PLT 196   CMET CMP     Component Value Date/Time   NA 136 01/04/2023 0223  K 3.6 01/04/2023 0223   CL 103 01/04/2023 0223   CO2 23 01/04/2023 0223   GLUCOSE 103 (H) 01/04/2023 0223   BUN 45 (H) 01/04/2023 0223   CREATININE 2.49 (H) 01/04/2023 0223   CALCIUM 11.0 (H) 01/04/2023 0223   PROT 7.5 01/01/2023 0143   ALBUMIN 3.7 01/01/2023 0143   AST 19 01/01/2023 0143   ALT 9 01/01/2023 0143   ALKPHOS 61 01/01/2023 0143   BILITOT 0.8 01/01/2023 0143   GFRNONAA 20 (L) 01/04/2023 0223    GFR Estimated Creatinine Clearance: 20.8 mL/min (A) (by C-G formula based on SCr of 2.49 mg/dL (H)). No results for input(s): "LIPASE", "AMYLASE" in the last 72 hours. No results for input(s): "CKTOTAL", "CKMB", "CKMBINDEX", "TROPONINI" in the last 72 hours.  Invalid input(s): "POCBNP" No results for input(s): "DDIMER" in the last 72 hours. No results for input(s): "HGBA1C" in the last 72 hours.  No results for input(s): "CHOL", "HDL", "LDLCALC", "TRIG", "CHOLHDL", "LDLDIRECT" in the last 72 hours. No results for input(s): "TSH", "T4TOTAL", "T3FREE", "THYROIDAB" in the last 72  hours.  Invalid input(s): "FREET3" No results for input(s): "VITAMINB12", "FOLATE", "FERRITIN", "TIBC", "IRON", "RETICCTPCT" in the last 72 hours. Coags: No results for input(s): "INR" in the last 72 hours.  Invalid input(s): "PT"  Microbiology: Recent Results (from the past 240 hour(s))  Blood culture (routine x 2)     Status: None (Preliminary result)   Collection Time: 12/31/22  2:22 PM   Specimen: BLOOD  Result Value Ref Range Status   Specimen Description BLOOD RIGHT ANTECUBITAL  Final   Special Requests   Final    BOTTLES DRAWN AEROBIC AND ANAEROBIC Blood Culture adequate volume   Culture   Final    NO GROWTH 4 DAYS Performed at [facility], [address] [place], [place] [address]    Report Status PENDING  Incomplete  Urine Culture (for pregnant, neutropenic or urologic patients or patients with an indwelling urinary catheter)     Status: None   Collection Time: 12/31/22  9:38 PM   Specimen: Urine, Clean Catch  Result Value Ref Range Status   Specimen Description URINE, CLEAN CATCH  Final   Special Requests NONE  Final   Culture   Final    NO GROWTH Performed at [facility], [address] [place], [place] [address]    Report Status 01/01/2023 FINAL  Final  SARS Coronavirus 2 by RT PCR (hospital order, performed in [facility] hospital lab) *cepheid single result test* Peripheral     Status: None   Collection Time: 12/31/22 10:58 PM   Specimen: Peripheral; Nasal Swab  Result Value Ref Range Status   SARS Coronavirus 2 by RT PCR NEGATIVE NEGATIVE Final    Comment: Performed at [facility], [address], [place], [place] [address]  Blood culture (routine x 2)     Status: None (Preliminary result)   Collection Time: 12/31/22 11:16 PM   Specimen: BLOOD  Result Value Ref Range Status   Specimen Description BLOOD SITE NOT SPECIFIED  Final   Special Requests   Final    BOTTLES DRAWN AEROBIC AND ANAEROBIC Blood Culture adequate volume   Culture    Final    NO GROWTH 4 DAYS Performed at [facility], [address], [place], [place] [address]    Report Status PENDING  Incomplete    FURTHER DISCHARGE INSTRUCTIONS:  Get Medicines reviewed and adjusted: Please take all your medications with you  for your next visit with your Primary MD  Laboratory/radiological data: Please request your Primary MD to go over all hospital tests and procedure/radiological results at the follow up, please ask your Primary MD to get all Hospital records sent to his/her office.  In some cases, they will be blood work, cultures and biopsy results pending at the time of your discharge. Please request that your primary care M.D. goes through all the records of your hospital data and follows up on these results.  Also Note the following: If you experience worsening of your admission symptoms, develop shortness of breath, life threatening emergency, suicidal or homicidal thoughts you must seek medical attention immediately by calling 911 or calling your MD immediately  if symptoms less severe.  You must read complete instructions/literature along with all the possible adverse reactions/side effects for all the Medicines you take and that have been prescribed to you. Take any new Medicines after you have completely understood and accpet all the possible adverse reactions/side effects.   Do not drive when taking Pain medications or sleeping medications (Benzodaizepines)  Do not take more than prescribed Pain, Sleep and Anxiety Medications. It is not advisable to combine anxiety,sleep and pain medications without talking with your primary care practitioner  Special Instructions: If you have smoked or chewed Tobacco  in the last 2 yrs please stop smoking, stop any regular Alcohol  and or any Recreational drug use.  Wear Seat belts while driving.  Please note: You were cared for by a hospitalist during your hospital stay. Once you are discharged, your primary  care physician will handle any further medical issues. Please note that NO REFILLS for any discharge medications will be authorized once you are discharged, as it is imperative that you return to your primary care physician (or establish a relationship with a primary care physician if you do not have one) for your post hospital discharge needs so that they can reassess your need for medications and monitor your lab values.  Total Time spent coordinating discharge including counseling, education and face to face time equals greater than 30 minutes.  SignedJeoffrey Massed 01/04/2023 7:04 AM

## 2023-01-03 NOTE — Progress Notes (Signed)
Physical Therapy Treatment Patient Details Name: Samantha [name] MRN: [id number] DOB: 08-Nov-1945 Today's Date: 01/03/2023   History of Present Illness Pt is a 77 y/o female admitted 8/18 after fall unwitnessed fall OOB associated with some confusion. 8/18 cervical MRI showing "Mild edema within the posterior paraspinous musculature at the  level of C4-5, likely mild muscular injury/strain in the setting of  trauma. 2. No other evidence for acute traumatic injury within the cervical spine. No evidence for traumatic cord or ligamentous injury.  3. Multilevel cervical spondylosis with resultant mild to moderate spinal stenosis at C4-5 and C5-6. Associated mild to moderate left C5 foraminal narrowing, with severe bilateral C6 foraminal stenosis". 8/20 EEG negative for seizures or epileptiform changes. PMHx: Bipolar, CKD, CHF, HTN, DM2, CVA, mobitz type 1 heart block.    PT Comments  Pt received in supine, agreeable to therapy session with encouragement, with improved command following and activity tolerance this date compared with previous PT session. Pt able to initiate transfer training and perform sit<>stand from elevated surface with modA and sidesteps toward [facility] with RW and modA. Unclear if pt is back to cognitive baseline due to no family present in room and pt states she did not have breakfast, NT notified and planning to check on this for her. Pt continues to benefit from PT services to progress toward functional mobility goals.     If plan is discharge home, recommend the following: A lot of help with walking and/or transfers;A lot of help with bathing/dressing/bathroom;Assistance with cooking/housework;Direct supervision/assist for medications management;Direct supervision/assist for financial management;Assist for transportation   Can travel by private vehicle     No  Equipment Recommendations  None recommended by PT;Other (comment) (TBD)    Recommendations for Other Services        Precautions / Restrictions Precautions Precautions: Fall Precaution Comments: Contact precs, fear of falls Restrictions Weight Bearing Restrictions: No     Mobility  Bed Mobility Overal bed mobility: Needs Assistance Bed Mobility: Rolling, Sidelying to Sit, Sit to Supine Rolling: Min assist Sidelying to sit: Mod assist   Sit to supine: Mod assist   General bed mobility comments: mod cues for safety/sequencing and use of bed features, pt performs with increased time. Pt using rail. Increased assist to elevate trunk to upright and for bringing legs back up over edge of bed returning to supine. MaxA for posterior supine scooting to [facility] with bed in trendelenburg at end of session.    Transfers Overall transfer level: Needs assistance   Transfers: Sit to/from Stand, Bed to chair/wheelchair/BSC Sit to Stand: Mod assist, From elevated surface   Step pivot transfers: Mod assist, From elevated surface       General transfer comment: from elevated bed>RW and sidesteps to her R along EOB to simulate step pivot transfer. Pt fatigued after ~6 steps, needing to sit back down. Defer chair transfer as pt c/o fatigue and plan for DC later in the day.    Ambulation/Gait                   Stairs             Wheelchair Mobility     Tilt Bed    Modified Rankin (Stroke Patients Only)       Balance Overall balance assessment: Needs assistance Sitting-balance support: Single extremity supported, Bilateral upper extremity supported, Feet supported Sitting balance-Leahy Scale: Fair Sitting balance - Comments: reliant on UE assist to maintain sitting without external assist. pt fearful of movements  that disrupt her attempts at balance and tends to lean backward impulsively; poor balance when unsupported.   Standing balance support: Bilateral upper extremity supported Standing balance-Leahy Scale: Poor Standing balance comment: RW and external assist for safety with static  and dynamic standing                            Cognition Arousal: Alert Behavior During Therapy: Flat affect Overall Cognitive Status: No family/caregiver present to determine baseline cognitive functioning                                 General Comments: Pt A&O to self, minimally verbal but following simple mobility commands with some delay. Pt expressed fear of falls prior to standing but was able to proceed with encouragement.        Exercises Other Exercises Other Exercises: supine BLE AROM: ankle pumps, heel slides, hip abduction (some AA due to cognition) x10 reps ea Other Exercises: STS x2 reps for BLE strengthening Other Exercises: standing hip flexion x5 reps ea for strengthening Other Exercises: seated lateral leans to each side x2 with elbow taps, cues for core activation    General Comments General comments (skin integrity, edema, etc.): Pt shaking increased with fatigue, states she is cold. Also appears more tremulous due to fear of falls/fatigue. Pt c/o feeling hungry and states she did not get breakfast, NT notified. Pt assisted to eat one container of her applesauce (already opened by RN for AM med pass) during session.      Pertinent Vitals/Pain Pain Assessment Pain Assessment: No/denies pain Pain Intervention(s): Monitored during session, Repositioned     PT Goals (current goals can now be found in the care plan section) Acute Rehab PT Goals Patient Stated Goal: To get OOB daily PT Goal Formulation: With patient Time For Goal Achievement: 01/08/23 Progress towards PT goals: Progressing toward goals    Frequency    Min 1X/week      PT Plan         AM-PAC PT "6 Clicks" Mobility   Outcome Measure  Help needed turning from your back to your side while in a flat bed without using bedrails?: A Lot Help needed moving from lying on your back to sitting on the side of a flat bed without using bedrails?: Total (flat bed/no  rails) Help needed moving to and from a bed to a chair (including a wheelchair)?: Total Help needed standing up from a chair using your arms (e.g., wheelchair or bedside chair)?: A Lot Help needed to walk in hospital room?: Total Help needed climbing 3-5 steps with a railing? : Total 6 Click Score: 8    End of Session Equipment Utilized During Treatment: Gait belt Activity Tolerance: Patient tolerated treatment well Patient left: in bed;with call bell/phone within reach;with bed alarm set;Other (comment) (bed in chair posture, heels floated) Nurse Communication: Mobility status;Other (comment) (pt asking about breakfast, she missed that meal?) PT Visit Diagnosis: Muscle weakness (generalized) (M62.81);Other abnormalities of gait and mobility (R26.89)     Time: 2956-2130 PT Time Calculation (min) (ACUTE ONLY): 26 min  Charges:    $Therapeutic Exercise: 8-22 mins $Therapeutic Activity: 8-22 mins PT General Charges $$ ACUTE PT VISIT: 1 Visit                     Gigi Onstad P., PTA Acute Rehabilitation Services Secure Chat Preferred  9a-5:30pm Office: [phone]    Dorathy Kinsman Kipper Buch 01/03/2023, 3:15 PM

## 2023-01-04 DIAGNOSIS — G9340 Encephalopathy, unspecified: Secondary | ICD-10-CM | POA: Diagnosis not present

## 2023-01-04 LAB — BASIC METABOLIC PANEL
Anion gap: 10 (ref 5–15)
BUN: 45 mg/dL — ABNORMAL HIGH (ref 8–23)
CO2: 23 mmol/L (ref 22–32)
Calcium: 11 mg/dL — ABNORMAL HIGH (ref 8.9–10.3)
Chloride: 103 mmol/L (ref 98–111)
Creatinine, Ser: 2.49 mg/dL — ABNORMAL HIGH (ref 0.44–1.00)
GFR, Estimated: 20 mL/min — ABNORMAL LOW (ref >60)
Glucose, Bld: 103 mg/dL — ABNORMAL HIGH (ref 70–99)
Potassium: 3.6 mmol/L (ref 3.5–5.1)
Sodium: 136 mmol/L (ref 135–145)

## 2023-01-04 LAB — GLUCOSE, CAPILLARY
Glucose-Capillary: 122 mg/dL — ABNORMAL HIGH (ref 70–99)
Glucose-Capillary: 112 mg/dL — ABNORMAL HIGH (ref 70–99)
Glucose-Capillary: 118 mg/dL — ABNORMAL HIGH (ref 70–99)

## 2023-01-04 NOTE — TOC Transition Note (Signed)
Transition of Care [facility]) - CM/SW Discharge Note   Patient Details  Name: Samantha [name] MRN: [id number] Date of Birth: 01-24-1946  [facility]) CM/SW Contact:  Erin Sons, LCSW Phone Number: 01/04/2023, 12:58 PM   Clinical Narrative:     Patient will DC to: Anticipated DC date: Family notified: Transport by:   Per MD patient ready for DC to [facility]. RN, patient, patient's family, and facility notified of DC. Discharge Summary and FL2 sent to facility. RN to call report prior to discharge [phone] Room 714). DC packet on chart. Ambulance transport requested for patient.   CSW will sign off for now as social work intervention is no longer needed. Please consult [place] again if new needs arise.   Final next level of care: Skilled Nursing Facility Barriers to Discharge: No Barriers Identified   Discharge Placement                Patient chooses bed at: [facility] Patient to be transferred to facility by: [facility] Name of family member notified: [name] Patient and family notified of of transfer: 01/04/23  Discharge Plan and Services Additional resources added to the After Visit Summary for                                       Social Determinants of Health (SDOH) Interventions SDOH Screenings   Food Insecurity: No Food Insecurity (01/01/2023)  Housing: Patient Declined (01/02/2023)  Transportation Needs: Patient Declined (01/02/2023)  Utilities: Patient Declined (01/02/2023)  Social Connections: Unknown (09/26/2021)   Received from [facility], [facility]  Tobacco Use: Unknown (01/01/2023)     Readmission Risk Interventions     No data to display

## 2023-01-04 NOTE — Progress Notes (Signed)
PROGRESS NOTE        PATIENT DETAILS Name: Samantha [name] Age: 77 y.o. Sex: female Date of Birth: 1945/06/27 Admit Date: 12/31/2022 Admitting Physician Therisa Doyne, MD ZOX:WRUEAV, Windy Fast, MD  Brief Summary: Patient is a 77 y.o.  female with history of CKD 4, HTN, DM-2, HFpEF, OSA on CPAP, bipolar disorder, recent ESBL UTI at SNF treated with Invanz x 10 days-presenting from SNF following a unwitnessed fall and confusion.    Significant events: 8/18>> admit to [facility]  Significant studies: 8/18>> CXR: No PNA 8/18>> x-ray pelvis: No osseous injury 8/18 >>x-ray left pelvis: No osseous injury 8/18>> CT head: No acute abnormality 8/18>> CT C-spine: No fracture 8/18>> CT chest/abdomen/pelvis: No acute findings chest/abdomen/pelvis 8/18>> x-ray left shoulder: No acute osseous injury 8/18>> MRI brain: No acute abnormality 8/19>> NH4: 17 8/19>> echo: EF> 75%  Significant microbiology data: 8/18>> blood culture: No growth 8/18>> urine culture: No growth 8/18>> COVID PCR: Negative  Procedures: None  Consults: Palliative care  Subjective: No issues-completely awake and alert this morning.  Objective: Vitals: Blood pressure 127/69, pulse 82, temperature 98.1 F (36.7 C), temperature source Oral, resp. rate 20, height 5\' 4"  (1.626 m), weight 88.9 kg, SpO2 99%.   Exam: Awake alert-sleeping comfortably Abdomen soft nontender Extremity no edema  Pertinent Labs/Radiology:    Latest Ref Rng & Units 01/02/2023    6:15 AM 01/01/2023    3:57 AM 12/31/2022    2:54 PM  CBC  WBC 4.0 - 10.5 K/uL 9.8  10.9    Hemoglobin 12.0 - 15.0 g/dL 40.9  81.1  91.4   Hematocrit 36.0 - 46.0 % 33.1  35.6  37.0   Platelets 150 - 400 K/uL 196  206      Lab Results  Component Value Date   NA 136 01/04/2023   K 3.6 01/04/2023   CL 103 01/04/2023   CO2 23 01/04/2023      Assessment/Plan: Acute metabolic encephalopathy Unclear etiology but possibly could be from  AKI-and probably from psych meds. Underwent extensive workup-see above Managed with supportive care-improved-she is completely awake and alert this morning   Recent ESBL E. coli UTI while at SNF Apparently completed 10 days of Invanz Afebrile-not felt to have ongoing UTI-continue to watch off antibiotics.   AKI on CKD 4 AKI likely hemodynamically mediated AKI improved-creatinine back to baseline. Avoid nephrotoxic agents.   Chronic HFpEF Euvolemic   HTN BP on the higher side Resume amlodipine/hydralazine/Coreg on discharge   HLD Statin   Bipolar disorder/schizoaffective disorder Haldol doses was slightly reduced-but she has significantly improved-will resume usual dosing of Depakote and Haldol on discharge.    Peripheral neuropathy Resume Lyrica-dose reduced on discharge due to concern for contribution and encephalopathy.   OSA CPAP nightly ( autotitrate settings in the hospital)   Fall at [facility] Apparently slipped off the bed-no syncopal episode PT/OT eval-back to SNF. Imaging studies negative for any serious injuries-see above.   Obesity: Estimated body mass index is 33.64 kg/m as calculated from the following:   Height as of this encounter: 5\' 4"  (1.626 m).   Weight as of this encounter: 88.9 kg.    Nutrition Status: Nutrition Problem: Increased nutrient needs Etiology: chronic illness Signs/Symptoms: estimated needs Interventions: Liberalize Diet    Code status:   Code Status: Full Code   DVT Prophylaxis: SCDs Start: 01/01/23 0024  Family Communication: None at bedside  Disposition Plan: Status is: Inpatient Remains inpatient appropriate because: severity of illness   Planned Discharge Destination:Skilled nursing facility   Diet: Diet Order             Diet - low sodium heart healthy           Diet regular Room service appropriate? Yes with Assist; Fluid consistency: Thin; Fluid restriction: 1800 mL Fluid  Diet effective now                      Antimicrobial agents: Anti-infectives (From admission, onward)    Start     Dose/Rate Route Frequency Ordered Stop   01/01/23 1200  meropenem (MERREM) 500 mg in sodium chloride 0.9 % 100 mL IVPB  Status:  Discontinued        500 mg 200 mL/hr over 30 Minutes Intravenous Every 12 hours 12/31/22 2334 01/01/23 1349   12/31/22 2215  meropenem (MERREM) 1 g in sodium chloride 0.9 % 100 mL IVPB        1 g 200 mL/hr over 30 Minutes Intravenous  Once 12/31/22 2201 12/31/22 2350   12/31/22 1915  cefTRIAXone (ROCEPHIN) 1 g in sodium chloride 0.9 % 100 mL IVPB        1 g 200 mL/hr over 30 Minutes Intravenous  Once 12/31/22 1907 12/31/22 2038   12/31/22 1900  fluconazole (DIFLUCAN) tablet 50 mg        50 mg Oral  Once 12/31/22 1846 12/31/22 2043        MEDICATIONS: Scheduled Meds:  amLODipine  10 mg Oral Daily   aspirin EC  81 mg Oral Daily   atorvastatin  20 mg Oral QHS   divalproex  125 mg Oral Q12H   haloperidol  0.5 mg Oral q AM   And   haloperidol  0.5 mg Oral QHS   hydrALAZINE  100 mg Oral TID   latanoprost  1 drop Both Eyes QHS   lidocaine  1 patch Transdermal Q24H   multivitamin  1 tablet Oral QHS   pantoprazole  40 mg Oral Daily   tamsulosin  0.4 mg Oral Daily   Continuous Infusions: PRN Meds:.acetaminophen **OR** acetaminophen, albuterol, bisacodyl, dextrose, ondansetron **OR** ondansetron (ZOFRAN) IV   I have personally reviewed following labs and imaging studies  LABORATORY DATA: CBC: Recent Labs  Lab 12/31/22 1425 12/31/22 1454 01/01/23 0357 01/02/23 0615  WBC 11.1*  --  10.9* 9.8  HGB 11.3* 12.6 10.9* 10.3*  HCT 36.1 37.0 35.6* 33.1*  MCV 86.8  --  86.0 85.5  PLT 213  --  206 196    Basic Metabolic Panel: Recent Labs  Lab 12/31/22 1425 12/31/22 1454 01/01/23 0143 01/02/23 0615 01/03/23 0020 01/04/23 0223  NA 138 137 137 136 137 136  K 5.0 5.1 5.1 4.4 4.2 3.6  CL 99 105 103 103 104 103  CO2 24  --  25 22 24 23   GLUCOSE 113* 109* 81 93  91 103*  BUN 61* 61* 57* 49* 47* 45*  CREATININE 3.54* 3.70* 3.27* 2.51* 2.56* 2.49*  CALCIUM 11.3*  --  10.7* 10.6* 11.0* 11.0*  MG 2.5*  --  2.4  --   --   --   PHOS  --   --  3.3  --   --   --     GFR: Estimated Creatinine Clearance: 20.8 mL/min (A) (by C-G formula based on SCr of 2.49 mg/dL (H)).  Liver  Function Tests: Recent Labs  Lab 12/31/22 1425 01/01/23 0143  AST 14* 19  ALT 8 9  ALKPHOS 66 61  BILITOT 0.2* 0.8  PROT 7.7 7.5  ALBUMIN 3.9 3.7   No results for input(s): "LIPASE", "AMYLASE" in the last 168 hours. Recent Labs  Lab 01/01/23 0143  AMMONIA 17    Coagulation Profile: Recent Labs  Lab 12/31/22 1425  INR 1.0    Cardiac Enzymes: Recent Labs  Lab 12/31/22 1425  CKTOTAL 50    BNP (last 3 results) No results for input(s): "PROBNP" in the last 8760 hours.  Lipid Profile: No results for input(s): "CHOL", "HDL", "LDLCALC", "TRIG", "CHOLHDL", "LDLDIRECT" in the last 72 hours.  Thyroid Function Tests: No results for input(s): "TSH", "T4TOTAL", "FREET4", "T3FREE", "THYROIDAB" in the last 72 hours.  Anemia Panel: No results for input(s): "VITAMINB12", "FOLATE", "FERRITIN", "TIBC", "IRON", "RETICCTPCT" in the last 72 hours.  Urine analysis:    Component Value Date/Time   COLORURINE YELLOW 12/31/2022 1712   APPEARANCEUR CLOUDY (A) 12/31/2022 1712   LABSPEC 1.012 12/31/2022 1712   PHURINE 5.0 12/31/2022 1712   GLUCOSEU NEGATIVE 12/31/2022 1712   HGBUR NEGATIVE 12/31/2022 1712   BILIRUBINUR NEGATIVE 12/31/2022 1712   KETONESUR NEGATIVE 12/31/2022 1712   PROTEINUR NEGATIVE 12/31/2022 1712   UROBILINOGEN 0.2 04/05/2014 2138   NITRITE POSITIVE (A) 12/31/2022 1712   LEUKOCYTESUR TRACE (A) 12/31/2022 1712    Sepsis Labs: Lactic Acid, Venous    Component Value Date/Time   LATICACIDVEN 1.8 12/31/2022 1455    MICROBIOLOGY: Recent Results (from the past 240 hour(s))  Blood culture (routine x 2)     Status: None (Preliminary result)    Collection Time: 12/31/22  2:22 PM   Specimen: BLOOD  Result Value Ref Range Status   Specimen Description BLOOD RIGHT ANTECUBITAL  Final   Special Requests   Final    BOTTLES DRAWN AEROBIC AND ANAEROBIC Blood Culture adequate volume   Culture   Final    NO GROWTH 4 DAYS Performed at [facility], [address], [place], [place] [address]    Report Status PENDING  Incomplete  Urine Culture (for pregnant, neutropenic or urologic patients or patients with an indwelling urinary catheter)     Status: None   Collection Time: 12/31/22  9:38 PM   Specimen: Urine, Clean Catch  Result Value Ref Range Status   Specimen Description URINE, CLEAN CATCH  Final   Special Requests NONE  Final   Culture   Final    NO GROWTH Performed at [facility], [address] [place], [place] [address]    Report Status 01/01/2023 FINAL  Final  SARS Coronavirus 2 by RT PCR (hospital order, performed in [facility] hospital lab) *cepheid single result test* Peripheral     Status: None   Collection Time: 12/31/22 10:58 PM   Specimen: Peripheral; Nasal Swab  Result Value Ref Range Status   SARS Coronavirus 2 by RT PCR NEGATIVE NEGATIVE Final    Comment: Performed at [facility], [address] [place], [place] [address]  Blood culture (routine x 2)     Status: None (Preliminary result)   Collection Time: 12/31/22 11:16 PM   Specimen: BLOOD  Result Value Ref Range Status   Specimen Description BLOOD SITE NOT SPECIFIED  Final   Special Requests   Final    BOTTLES DRAWN AEROBIC AND ANAEROBIC Blood Culture adequate volume   Culture   Final    NO GROWTH  4 DAYS Performed at [facility], [address] [place], [place] [address]    Report Status PENDING  Incomplete    RADIOLOGY STUDIES/RESULTS: EEG adult  Result Date: 01/02/2023 Charlsie Quest, MD     01/02/2023 12:48 PM Patient Name: Samantha [name] MRN: [id number] Epilepsy Attending: Charlsie Quest Referring  Physician/Provider: Maretta Bees, MD Date: 01/02/2023 Duration: 24.50 mins Patient history: 77yo F with ams getting eeg to evaluate for seizure. Level of alertness: Awake, asleep AEDs during EEG study: VPA Technical aspects: This EEG study was done with scalp electrodes positioned according to the 10-20 International system of electrode placement. Electrical activity was reviewed with band pass filter of 1-70Hz , sensitivity of 7 uV/mm, display speed of 35mm/sec with a 60Hz  notched filter applied as appropriate. EEG data were recorded continuously and digitally stored.  Video monitoring was available and reviewed as appropriate. Description: No posterior dominant rhythm was seen. Sleep was characterized by vertex waves, sleep spindles (12 to 14 Hz), maximal frontocentral region. EEG showed continuous generalized 3 to 5 Hz theta-delta slowing. Hyperventilation and photic stimulation were not performed.   ABNORMALITY - Continuous slow, generalized IMPRESSION: This study is suggestive of moderate diffuse encephalopathy, nonspecific etiology. No seizures or epileptiform discharges were seen throughout the recording. Samantha [name]     LOS: 4 days   Jeoffrey Massed, MD  [facility]    To contact the attending provider between 7A-7P or the covering provider during after hours 7P-7A, please log into the web site [email or web address] and access using universal [place] password for that web site. If you do not have the password, please call the hospital operator.  01/04/2023, 9:29 AM

## 2023-01-04 NOTE — TOC Progression Note (Signed)
Transition of Care [facility]) - Progression Note    Patient Details  Name: Samantha [name] MRN: [id number] Date of Birth: 27-Sep-1945  [facility]) CM/SW Contact  Erin Sons, [place] Phone Number: 01/04/2023, 9:58 AM  Clinical Narrative:     CSW spoke with pt daughter about potential DC. Daughter explains that pt needs a new CPAP as the one she was using from home has not been working. [facility] admissions has ordered a new CPAP. Pt can admit once it is delivered; likely on Thursday.   Expected Discharge Plan: Skilled Nursing Facility Barriers to Discharge: Facility ordering CPAP  Expected Discharge Plan and Services       Living arrangements for the past 2 months: Skilled Nursing Facility Expected Discharge Date: 01/03/23                                     Social Determinants of Health (SDOH) Interventions SDOH Screenings   Food Insecurity: No Food Insecurity (01/01/2023)  Housing: Patient Declined (01/02/2023)  Transportation Needs: Patient Declined (01/02/2023)  Utilities: Patient Declined (01/02/2023)  Social Connections: Unknown (09/26/2021)   Received from [facility], [facility]  Tobacco Use: Unknown (01/01/2023)    Readmission Risk Interventions     No data to display

## 2023-01-04 NOTE — Progress Notes (Signed)
Report called to [place] at [facility] [phone]. All questions answered. Call back number left if needed.

## 2023-01-04 NOTE — Progress Notes (Signed)
Occupational Therapy Treatment Patient Details Name: Samantha [name] MRN: [id number] DOB: [birth year] Today's Date: 01/04/2023   History of present illness Pt is a 77 y/o female admitted 8/18 after fall unwitnessed fall OOB associated with some confusion. 8/18 cervical MRI showing "Mild edema within the posterior paraspinous musculature at the  level of C4-5, likely mild muscular injury/strain in the setting of  trauma. 2. No other evidence for acute traumatic injury within the cervical spine. No evidence for traumatic cord or ligamentous injury.  3. Multilevel cervical spondylosis with resultant mild to moderate spinal stenosis at C4-5 and C5-6. Associated mild to moderate left C5 foraminal narrowing, with severe bilateral C6 foraminal stenosis". 8/20 EEG negative for seizures or epileptiform changes. PMHx: Bipolar, CKD, CHF, HTN, DM2, CVA, mobitz type 1 heart block.   OT comments  Pt continued to progress in OT session, she expressed a FOF but with support she was able to complete LB bathing with Min A to help reach her feet. Pt balance began to regress with fatigue and onset of back pain needing Max A to help maintain but she sat EOB for ~25 mins to complete ADLs. Pt was Mod A for stand pivot transfer to EMS stretcher. Pt is able to complete for tasks than she believes and needs encouragement and the right amount of challenge. OT to continue to progress pt as able. DC plans remain appropriate for SNF.       If plan is discharge home, recommend the following:  A lot of help with walking and/or transfers;A lot of help with bathing/dressing/bathroom;Assistance with cooking/housework;Direct supervision/assist for medications management;Assist for transportation   Equipment Recommendations  Other (comment) (defer)    Recommendations for Other Services      Precautions / Restrictions Precautions Precautions: Fall Precaution Comments: Contact precs, fear of falls Restrictions Weight Bearing  Restrictions: No       Mobility Bed Mobility Overal bed mobility: Needs Assistance Bed Mobility: Supine to Sit, Sit to Supine     Supine to sit: Mod assist Sit to supine: Mod assist   General bed mobility comments: cues for rolling and use of bed rails    Transfers Overall transfer level: Needs assistance   Transfers: Bed to chair/wheelchair/BSC       Step pivot transfers: Mod assist     General transfer comment: Pt transferred from EOB to EMS stretcher for DC     Balance Overall balance assessment: Needs assistance Sitting-balance support: Single extremity supported, Bilateral upper extremity supported, Feet supported Sitting balance-Leahy Scale: Fair Sitting balance - Comments: Intiially sat EOB with fair balance CGA, sitting balance became poor with fatigue and onset of back pain Postural control: Posterior lean, Left lateral lean                                 ADL either performed or assessed with clinical judgement   ADL Overall ADL's : Needs assistance/impaired Eating/Feeding: Moderate assistance;Bed level   Grooming: Wash/dry hands;Wash/dry face;Set up;Sitting;Contact guard assist Grooming Details (indicate cue type and reason): sitting balance can be questionable Upper Body Bathing: Set up;Contact guard assist;Sitting Upper Body Bathing Details (indicate cue type and reason): sitting balance can be questionable Lower Body Bathing: Minimal assistance;Sitting/lateral leans Lower Body Bathing Details (indicate cue type and reason): min A to help with washing feet Upper Body Dressing : Contact guard assist;Sitting   Lower Body Dressing: Maximal assistance;Sitting/lateral leans Lower Body Dressing Details (indicate cue type  and reason): don/doff socks               General ADL Comments: Pt receptive to bathing and dressing in preparation for DC    Extremity/Trunk Assessment              Vision       Perception     Praxis       Cognition Arousal: Alert Behavior During Therapy: Flat affect Overall Cognitive Status: No family/caregiver present to determine baseline cognitive functioning                                 General Comments: Pt with slow processing and FOF        Exercises      Shoulder Instructions       General Comments Pt expressed FOF    Pertinent Vitals/ Pain       Pain Assessment Pain Assessment: Faces Faces Pain Scale: Hurts little more Pain Location: back Pain Descriptors / Indicators: Aching, Sore Pain Intervention(s): Monitored during session, Repositioned  Home Living                                          Prior Functioning/Environment              Frequency  Min 1X/week        Progress Toward Goals  OT Goals(current goals can now be found in the care plan section)  Progress towards OT goals: Progressing toward goals  Acute Rehab OT Goals OT Goal Formulation: With patient Time For Goal Achievement: 01/16/23 Potential to Achieve Goals: Fair  Plan      AM-PAC OT "6 Clicks" Daily Activity     Outcome Measure   Help from another person eating meals?: A Little Help from another person taking care of personal grooming?: A Little Help from another person toileting, which includes using toliet, bedpan, or urinal?: Total Help from another person bathing (including washing, rinsing, drying)?: A Little Help from another person to put on and taking off regular upper body clothing?: A Little Help from another person to put on and taking off regular lower body clothing?: A Lot 6 Click Score: 15    End of Session Equipment Utilized During Treatment: Gait belt  OT Visit Diagnosis: Unsteadiness on feet (R26.81);Other abnormalities of gait and mobility (R26.89);History of falling (Z91.81);Muscle weakness (generalized) (M62.81);Other symptoms and signs involving cognitive function   Activity Tolerance Patient tolerated treatment  well   Patient Left in bed;Other (comment) (In EMS stretcher for transport with EMS team)   Nurse Communication Mobility status        Time: [phone] OT Time Calculation (min): 40 min  Charges: OT General Charges $OT Visit: 1 Visit OT Treatments $Self Care/Home Management : 23-37 mins $Therapeutic Activity: 8-22 mins  01/04/2023  AB, OTR/L  Acute Rehabilitation Services  Office: [phone]   Tristan Schroeder 01/04/2023, 3:25 PM

## 2023-01-05 LAB — CULTURE, BLOOD (ROUTINE X 2)
Culture: NO GROWTH
Culture: NO GROWTH
Special Requests: ADEQUATE
Special Requests: ADEQUATE

## 2023-03-23 NOTE — Progress Notes (Signed)
 Subjective:  Samantha [name] is a 77 y.o. 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 Here from [facility] in [place]. Had been lost to follow-up since December, 2022 following hospital admission for hypercalcemia, hypertensive urgency. Here with her daughter, Kaaren. Patient is a poor historian.In a wheelchair mostly and uses a walker to the bathroom. Admitted to [facility] 8/18 to 8/22 status post fall with altered mental status following treatment for ESBL UTI at SNF. CT head negative for acute intracranial abnormality. Recently completed IV abx for MDR UTI and picc line discontinued last week. CKD labs today pending. CKD dates back to 2018 per documentation-creatinine range 1.9-2.3. Last BUN/creatinine 37/3.0, eGFR 16 mL/min. BP well controlled. No uremic or LUT symptoms.  Review of Systems A complete ROS was performed with pertinent positives and negatives noted in the HPI. The remainder of the ROS are negative.  Meds Ordered in [place]  Medication Sig Dispense Refill  . acetaminophen  (TYLENOL ) 500 mg tablet Take 500 mg by mouth 3 (three) times a day.    . Allergy Relief, cetirizine, 5 mg tablet daily.    . amLODIPine  (NORVASC ) 10 mg tablet Take 10 mg by mouth Once Daily for 30 days. 30 tablet 0  . atorvastatin  (LIPITOR) 20 mg tablet Take 20 mg by mouth nightly. 90 tablet 3  . BisaCODYL  (DULCOLAX) 5 mg EC tablet Take 10 mg by mouth as needed for constipation.    . capsaicin (ZOTRIX) 0.025 % cream Apply 1 Application topically nightly.    . carvediloL  (COREG ) 12.5 mg tablet 12.5 mg  in the morning and 12.5 mg in the evening. Take with meals.    . cholecalciferol (VITAMIN D3) 125 mcg (5,000 unit) capsule Take 5,000 Units by mouth Once Daily. 30 capsule 11  . cinacalcet (SENSIPAR) 90 mg tablet Take 90 mg by mouth Once Daily. 30 tablet 6   . divalproex  (DEPAKOTE  DR) 125 mg 12 hr tablet Take 125 mg by mouth nightly.    . fluticasone furoate-vilanteroL (BREO ELLIPTA) 200-25 mcg/dose dsdv inhaler Inhale 1 puff once daily.    . fluticasone propionate (FLONASE) 50 mcg/spray nasal spray 2 sprays Once Daily.    . haloperidoL  (HALDOL ) 0.5 mg tablet One daily and two at bedtime    . hydrALAZINE  (APRESOLINE ) 100 mg tablet Take 100 mg by mouth 3 (three) times a day for 30 days. Indications: high blood pressure 90 tablet 0  . latanoprost  (XALATAN ) 0.005 % ophthalmic solution Administer 1 drop into each eyes nightly.    . magnesium  oxide 400 mg (241 mg magnesium ) tab Take 400 mg by mouth 2 (two) times a day.    . mirabegron (Myrbetriq) 50 mg Tb24 Take 25 mg by mouth daily.    SABRA omeprazole (PriLOSEC) 40 mg DR capsule Take 40 mg by mouth every morning.    . polyethylene glycol (GLYCOLAX) 17 gram packet Take 17 g by mouth Once Daily. 1 each 0  . pregabalin  (LYRICA ) 50 mg capsule Take 50 mg by mouth 2 (two) times a day for 3 days. 6 capsule 0  . propylene glycoL (Systane Balance) 0.6 % drop ophthalmic solution Administer 1 drop into each eyes 3 (three) times a day.    . semaglutide (Ozempic) 0.25 mg or 0.5 mg (2 mg/3 mL) pen injector Inject 0.5 mg under the skin once a week. WEDNESDAY'S    . sennosides-docusate sodium (PERICOLACE) 8.6-50 mg per  tablet Take 2 tablets by mouth 2 (two) times a day.    . tamsulosin  (FLOMAX ) 0.4 mg cap Take 0.4 mg by mouth Once Daily. 7 capsule 0  . albuterol  2.5 mg /3 mL (0.083 %) nebulizer solution 1 vial every 6 (six) hours as needed. (Patient not taking: Reported on 03/23/2023)    . albuterol  sulfate (Proair  Digihaler) 90 mcg/actuation aebs Inhale 2 puffs every 6 (six) hours as needed (wheezing). (Patient not taking: Reported on 03/23/2023)    . aspirin  81 mg EC tablet Take 81 mg by mouth daily. (Patient not taking: Reported on 03/23/2023)    . BisaCODYL  (DULCOLAX) 10 mg suppository Insert 10 mg into the rectum as  needed for constipation. (Patient not taking: Reported on 03/23/2023)    . diphenhydrAMINE (BENADRYL) 25 mg capsule Take 25 mg by mouth every 6 (six) hours as needed for allergies. (Patient not taking: Reported on 03/23/2023)    . EPINEPHrine (EPIPEN) 0.3 mg/0.3 mL injection syringe Inject 1 Syringe into the thigh as needed for anaphylaxis. (Patient not taking: into the thigh. Reported on 03/23/2023)    . insulin  glargine (LANTUS  SoloStar) 100 unit/mL (3 mL) pen Inject under the skin daily. (Patient not taking: Reported on 03/23/2023)    . levocetirizine (XYZAL) 5 mg tablet Take 5 mg by mouth every evening. (Patient not taking: Reported on 12/11/2022)    . melatonin tablet Take 10 mg by mouth nightly. (Patient not taking: Reported on 12/11/2022)    . methocarbamoL (ROBAXIN) 500 mg tablet Take 500 mg by mouth as needed for muscle spasms. (Patient not taking: Reported on 03/23/2023)    . ondansetron  (ZOFRAN ) 4 mg tablet Take 4 mg by mouth every 8 (eight) hours as needed for nausea or vomiting. (Patient not taking: Reported on 03/23/2023)    . traZODone  (DESYREL ) 50 mg tablet at bedtime. (Patient not taking: Reported on 03/23/2023)    . trimethoprim (TRIMPEX) 100 mg tablet 50 mg daily. (Patient not taking: Reported on 03/23/2023)    . Victoza  2-Pak 0.6 mg/0.1 mL (18 mg/3 mL) injection 1.8 mg Once Daily. (Patient not taking: Reported on 12/11/2022)     No current Epic-ordered facility-administered medications on file.      Objective:     Vitals:   03/23/23 1153  BP: 120/73  Patient Position: Sitting  Pulse: 65  Weight: 81.6 kg (180 lb)   Wt Readings from Last 3 Encounters:  03/23/23 81.6 kg (180 lb)  12/11/22 88.9 kg (196 lb)  08/07/22 92.8 kg (204 lb 9.6 oz)     Physical Exam: General appearance: alert, appears stated age, cooperative, and no distress Head: normocephalic, atraumatic Eyes: Perrla, EOMI Lungs: Ctab Heart: S1S2 present,regular rate and rhythm, no murmurs Abdomen: is flat,soft,  no tenderness, bs present Extremities: Resolved ankle edema bilaterally. Skin: Skin color, texture, turgor normal. No rashes or lesions Neurologic: alert and oriented times 3,CN I-XII intact, no neurologic deficits   Results for orders placed or performed in visit on 12/11/22  Vitamin D, 25-Hydroxy   Collection Time: 12/11/22 11:08 AM  Result Value Ref Range   Vitamin D 25-Hydroxy 74.5 30.0 - 100.0 ng/mL  Renal Function Panel   Collection Time: 12/11/22 11:08 AM  Result Value Ref Range   Sodium 134 (L) 136 - 145 mmol/L   Potassium 4.8 3.5 - 5.1 mmol/L   Chloride 98 98 - 107 mmol/L   CO2 27 21 - 31 mmol/L   Anion Gap 9 6 - 14 mmol/L   Glucose, Random  133 (H) 70 - 99 mg/dL   Blood Urea Nitrogen (BUN) 44 (H) 7 - 25 mg/dL   Creatinine 6.74 (H) 9.39 - 1.20 mg/dL   eGFR 14 (L) >40 fO/fpw/8.26f7   Albumin 4.1 3.5 - 5.7 g/dL   Calcium  10.9 (H) 8.6 - 10.3 mg/dL   Phosphorus 3.5 2.5 - 5.0 mg/dL   BUN/Creatinine Ratio 13.5 10.0 - 20.0  Parathyroid Hormone (PTH), Intact   Collection Time: 12/11/22 11:08 AM  Result Value Ref Range   Parathyroid Hormone (PTH), Intact 280 (H) 12 - 88 pg/mL  CBC with Differential   Collection Time: 12/11/22 11:08 AM  Result Value Ref Range   WBC 10.80 4.40 - 11.00 10*3/uL   RBC 3.86 (L) 4.10 - 5.10 10*6/uL   Hemoglobin 10.7 (L) 12.3 - 15.3 g/dL   Hematocrit 66.8 (L) 64.0 - 44.6 %   Mean Corpuscular Volume (MCV) 85.7 80.0 - 96.0 fL   Mean Corpuscular Hemoglobin (MCH) 27.7 27.5 - 33.2 pg   Mean Corpuscular Hemoglobin Conc (MCHC) 32.3 (L) 33.0 - 37.0 g/dL   Red Cell Distribution Width (RDW) 14.5 12.3 - 17.0 %   Platelet Count (PLT) 205 150 - 450 10*3/uL   Mean Platelet Volume (MPV) 10.2 6.8 - 10.2 fL   Neutrophils % 74 %   Lymphocytes % 15 %   Monocytes % 6 %   Eosinophils % 4 %   Basophils % 1 %   nRBC % 0 %   Neutrophils Absolute 8.00 (H) 1.80 - 7.80 10*3/uL   Lymphocytes # 1.70 1.00 - 4.80 10*3/uL   Monocytes # 0.70 0.00 - 0.80 10*3/uL    Eosinophils # 0.40 0.00 - 0.50 10*3/uL   Basophils # 0.10 0.00 - 0.20 10*3/uL   nRBC Absolute 0.00 <=0.00 10*3/uL      1. Benign hypertension with chronic kidney disease, stage IV (CMD)   2. Type 2 diabetes mellitus with stage 4 chronic kidney disease, with long-term current use of insulin  (HCC)   3. Metabolic bone disease   4. Vitamin D deficiency   5. Hyperkalemia   6. Hypercalcemia   7. Parathyroid adenoma       Plan:  -Will follow-up CKD labs. -Unable to tolerate prophylactic Bactrim due to risk of hyperkalemia. -Continue on Sensipar for parathyroid adenoma. -Blood pressure well-controlled. -Strict low-sodium diet.  Euvolemic on clinical exam today. -Return to clinic in 3 months.   No orders of the defined types were placed in this encounter.  Orders Placed This Encounter  Procedures  . Vitamin D, 25-Hydroxy    Standing Status:   Future    Number of Occurrences:   1    Standing Expiration Date:   03/22/2024    Order Specific Question:   Release to patient:    Answer:   Immediate  . Renal Function Panel    Standing Status:   Future    Number of Occurrences:   1    Standing Expiration Date:   03/22/2024    Order Specific Question:   Release to patient:    Answer:   Immediate  . Parathyroid Hormone (PTH), Intact    Standing Status:   Future    Number of Occurrences:   1    Standing Expiration Date:   03/22/2024    Order Specific Question:   Release to patient:    Answer:   Immediate  . CBC with Differential    Standing Status:   Future    Number of Occurrences:  1    Standing Expiration Date:   03/22/2024    Order Specific Question:   Release to patient:    Answer:   Immediate  . Vitamin D, 25-Hydroxy    Standing Status:   Future    Standing Expiration Date:   03/22/2024    Order Specific Question:   Release to patient:    Answer:   Immediate  . Urinalysis with Microscopic    Standing Status:   Future    Standing Expiration Date:   03/22/2024    Order  Specific Question:   Release to patient:    Answer:   Immediate  . Albumin, Random Urine    Standing Status:   Future    Standing Expiration Date:   03/22/2024    Order Specific Question:   Release to patient:    Answer:   Immediate  . CBC with Differential    Standing Status:   Future    Standing Expiration Date:   03/22/2024    Order Specific Question:   Release to patient:    Answer:   Immediate  . Magnesium     Standing Status:   Future    Standing Expiration Date:   09/20/2023    Order Specific Question:   Release to patient:    Answer:   Immediate  . Parathyroid Hormone (PTH), Intact    Standing Status:   Future    Standing Expiration Date:   03/22/2024    Order Specific Question:   Release to patient:    Answer:   Immediate  . Renal Function Panel    Standing Status:   Future    Standing Expiration Date:   03/22/2024    Order Specific Question:   Release to patient:    Answer:   Immediate    ADETOYE  [email or web address], MD, FASN 03/23/2023 12:41 PM

## 2023-07-27 ENCOUNTER — Emergency Department (HOSPITAL_COMMUNITY)
Admission: EM | Admit: 2023-07-27 | Discharge: 2023-07-28 | Disposition: A | Discharge status: Home / Self Care | Attending: Emergency Medicine | Admitting: Emergency Medicine

## 2023-07-27 ENCOUNTER — Other Ambulatory Visit: Payer: Self-pay

## 2023-07-27 ENCOUNTER — Emergency Department (HOSPITAL_COMMUNITY)

## 2023-07-27 DIAGNOSIS — R109 Unspecified abdominal pain: Secondary | ICD-10-CM | POA: Diagnosis not present

## 2023-07-27 DIAGNOSIS — R1111 Vomiting without nausea: Secondary | ICD-10-CM

## 2023-07-27 DIAGNOSIS — Z79899 Other long term (current) drug therapy: Secondary | ICD-10-CM | POA: Diagnosis not present

## 2023-07-27 DIAGNOSIS — I130 Hypertensive heart and chronic kidney disease with heart failure and stage 1 through stage 4 chronic kidney disease, or unspecified chronic kidney disease: Secondary | ICD-10-CM | POA: Insufficient documentation

## 2023-07-27 DIAGNOSIS — E1122 Type 2 diabetes mellitus with diabetic chronic kidney disease: Secondary | ICD-10-CM | POA: Diagnosis not present

## 2023-07-27 DIAGNOSIS — Z91010 Allergy to peanuts: Secondary | ICD-10-CM | POA: Diagnosis not present

## 2023-07-27 DIAGNOSIS — E1140 Type 2 diabetes mellitus with diabetic neuropathy, unspecified: Secondary | ICD-10-CM | POA: Insufficient documentation

## 2023-07-27 DIAGNOSIS — I509 Heart failure, unspecified: Secondary | ICD-10-CM | POA: Diagnosis not present

## 2023-07-27 DIAGNOSIS — J4520 Mild intermittent asthma, uncomplicated: Secondary | ICD-10-CM | POA: Insufficient documentation

## 2023-07-27 DIAGNOSIS — R112 Nausea with vomiting, unspecified: Secondary | ICD-10-CM | POA: Insufficient documentation

## 2023-07-27 DIAGNOSIS — Z7951 Long term (current) use of inhaled steroids: Secondary | ICD-10-CM | POA: Diagnosis not present

## 2023-07-27 DIAGNOSIS — Z7982 Long term (current) use of aspirin: Secondary | ICD-10-CM | POA: Diagnosis not present

## 2023-07-27 DIAGNOSIS — N184 Chronic kidney disease, stage 4 (severe): Secondary | ICD-10-CM | POA: Insufficient documentation

## 2023-07-27 LAB — COMPREHENSIVE METABOLIC PANEL
ALT: 7 U/L (ref 0–44)
AST: 18 U/L (ref 15–41)
Albumin: 4.3 g/dL (ref 3.5–5.0)
Alkaline Phosphatase: 46 U/L (ref 38–126)
Anion gap: 15 (ref 5–15)
BUN: 40 mg/dL — ABNORMAL HIGH (ref 8–23)
CO2: 21 mmol/L — ABNORMAL LOW (ref 22–32)
Calcium: 11.2 mg/dL — ABNORMAL HIGH (ref 8.9–10.3)
Chloride: 99 mmol/L (ref 98–111)
Creatinine, Ser: 3.08 mg/dL — ABNORMAL HIGH (ref 0.44–1.00)
GFR, Estimated: 15 mL/min — ABNORMAL LOW (ref >60)
Glucose, Bld: 111 mg/dL — ABNORMAL HIGH (ref 70–99)
Potassium: 4.6 mmol/L (ref 3.5–5.1)
Sodium: 135 mmol/L (ref 135–145)
Total Bilirubin: 0.7 mg/dL (ref 0.0–1.2)
Total Protein: 8.1 g/dL (ref 6.5–8.1)

## 2023-07-27 LAB — CBC
HCT: 39 % (ref 36.0–46.0)
Hemoglobin: 11.9 g/dL — ABNORMAL LOW (ref 12.0–15.0)
MCH: 27.3 pg (ref 26.0–34.0)
MCHC: 30.5 g/dL (ref 30.0–36.0)
MCV: 89.4 fL (ref 80.0–100.0)
Platelets: 298 10*3/uL (ref 150–400)
RBC: 4.36 MIL/uL (ref 3.87–5.11)
RDW: 15.9 % — ABNORMAL HIGH (ref 11.5–15.5)
WBC: 12.3 10*3/uL — ABNORMAL HIGH (ref 4.0–10.5)
nRBC: 0 % (ref 0.0–0.2)

## 2023-07-27 LAB — LIPASE, BLOOD: Lipase: 32 U/L (ref 11–51)

## 2023-07-27 MED ORDER — LACTATED RINGERS IV BOLUS
1000.0000 mL | Freq: Once | INTRAVENOUS | Status: AC
  Administered 2023-07-27: 1000 mL via INTRAVENOUS

## 2023-07-27 MED ORDER — ONDANSETRON HCL 4 MG/2ML IJ SOLN
4.0000 mg | Freq: Once | INTRAMUSCULAR | Status: AC
  Administered 2023-07-27: 4 mg via INTRAVENOUS
  Filled 2023-07-27: qty 2

## 2023-07-27 MED ORDER — PANTOPRAZOLE SODIUM 40 MG IV SOLR
40.0000 mg | Freq: Once | INTRAVENOUS | Status: AC
  Administered 2023-07-27: 40 mg via INTRAVENOUS
  Filled 2023-07-27: qty 10

## 2023-07-27 MED ORDER — SUCRALFATE 1 GM/10ML PO SUSP: 1.0000 g | Freq: Three times a day (TID) | ORAL | 0 refills | Status: AC

## 2023-07-27 NOTE — ED Notes (Signed)
 [facility] called

## 2023-07-27 NOTE — ED Triage Notes (Signed)
 Pt arrived via POV with daughter. Lives at [facility]. C/o bilious N/V, and R flank pain for 3x weeks.  Pt does take Ozempic

## 2023-07-27 NOTE — ED Provider Notes (Signed)
 [place] EMERGENCY DEPARTMENT AT [facility] Provider Note  CSN: [id number] Arrival date & time: 07/27/23 4098  Chief Complaint(s) Nausea, Emesis, and Flank Pain  HPI Samantha [name] is a 78 y.o. female here today today for nausea, vomiting, flank pain of 3 weeks duration.  Patient takes Ozempic.  Was at her urologist appointment today, described frequent episodes of vomiting neurologist recommended ED evaluation.  Patient a Geologist, engineering resident.  Patient does not ambulate, but is able to stand pivot to and from wheelchair.  Daughter is concerned that patient has seemed more weak.   Past Medical History Past Medical History:  Diagnosis Date   Anemia    Bipolar 1 disorder (HCC)    CHF (congestive heart failure) (HCC)    Depression    Diabetes mellitus without complication (HCC)    Hypertension    Neuropathy    Stroke 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) 02/03/2021   Hyperkalemia 02/03/2021   Type 2 diabetes mellitus with diabetic chronic kidney disease (HCC) 02/03/2021   Impacted cerumen of right ear 07/09/2016   Cough 07/07/2016   Exposure to influenza 07/07/2016   AKI (acute kidney injury) (HCC) 06/28/2016   Pulmonary edema with congestive heart failure with preserved left ventricular function (HCC) 06/25/2016   Urinary retention 06/24/2016   Abdominal distension 06/23/2016   Bradycardia 06/23/2016   Delirium 06/23/2016   Normocytic anemia 06/23/2016   Acute renal failure  superimposed on stage 3 chronic kidney disease (HCC) 06/18/2016   Hypoglycemia associated with type 2 diabetes mellitus (HCC) 06/10/2016   Vitamin D deficiency 06/09/2016   At risk for falls 06/07/2016   Constipation, unspecified 06/06/2016   Hemorrhoids without complication 06/06/2016   Hx of completed stroke 06/06/2016   Hypercalcemia 06/06/2016   Mild intermittent asthma without complication 06/06/2016   Sleep apnea, unspecified 06/06/2016   Schizoaffective disorder, depressive type (HCC) 06/03/2016   Auditory hallucination    Paranoia (HCC)    Visual hallucinations    Hyperglycemia 08/31/2013   Bipolar 1 disorder, depressed (HCC) 08/29/2013   Psychosis (HCC) 08/29/2013   Bipolar 1 disorder (HCC) 08/29/2013   Home Medication(s) Prior to Admission medications   Medication Sig Start Date End Date Taking? Authorizing Provider  acetaminophen (TYLENOL) 500 MG tablet Take 500 mg by mouth in the morning, at noon, and at bedtime. 11/01/19   [provider]  albuterol (PROVENTIL HFA;VENTOLIN HFA) 108 (90 BASE) MCG/ACT inhaler Inhale 2 puffs into the lungs every 6 (six) hours as needed for wheezing. Patient taking differently: Inhale 2 puffs into the lungs every 8 (eight) hours as needed for wheezing or shortness of breath. 09/02/13   Dorothea Ogle, MD  albuterol (PROVENTIL) (2.5 MG/3ML) 0.083% nebulizer solution Take 2.5 mg by nebulization every 6 (six) hours as needed for wheezing or shortness of breath.    [provider]  amLODipine (NORVASC) 10 MG tablet Take 10 mg by mouth daily. 10/22/21   [provider]  aspirin EC 81 MG tablet Take 81 mg by mouth  daily. 10/24/21   [provider]  atorvastatin (LIPITOR) 20 MG tablet Take 1 tablet by mouth at bedtime. 11/01/19   [provider]  bisacodyl 5 MG EC tablet Take 5 mg by mouth daily as needed for moderate constipation.    [provider]  budesonide-formoterol (SYMBICORT) 160-4.5 MCG/ACT  inhaler Inhale 2 puffs into the lungs 2 (two) times daily. Patient not taking: Reported on 01/01/2023    [provider]  capsaicin (ZOSTRIX) 0.025 % cream Apply 1 Application topically at bedtime. 10/24/21   [provider]  carvedilol (COREG) 12.5 MG tablet Take 12.5 mg by mouth 2 (two) times daily. Patient not taking: Reported on 01/01/2023 10/24/21   [provider]  cetirizine (ZYRTEC) 10 MG tablet Take 10 mg by mouth daily.    [provider]  cinacalcet (SENSIPAR) 90 MG tablet Take 90 mg by mouth daily. 10/15/21   [provider]  divalproex (DEPAKOTE SPRINKLE) 125 MG capsule Take 1 capsule (125 mg total) by mouth 2 (two) times daily. 01/03/23   Ghimire, Werner Lean, MD  EPINEPHrine 0.3 mg/0.3 mL IJ SOAJ injection Inject 0.3 mg into the muscle as needed for anaphylaxis. 08/04/17   [provider]  fluticasone (FLONASE) 50 MCG/ACT nasal spray Place 1 spray into both nostrils daily.    [provider]  haloperidol (HALDOL) 0.5 MG tablet Take 0.5-1 mg by mouth See admin instructions. Take 1 tablet by mouth in the morning and 2 tablets at bedtime 11/01/19   [provider]  hydrALAZINE (APRESOLINE) 100 MG tablet Take 100 mg by mouth 3 (three) times daily. 10/12/21   [provider]  latanoprost (XALATAN) 0.005 % ophthalmic solution Place 1 drop into both eyes nightly. 11/01/19   [provider]  lidocaine 4 % Place 1 patch onto the skin daily.    [provider]  magnesium oxide (MAG-OX) 400 MG tablet Take 1 tablet by mouth 2 (two) times daily. 10/24/21   [provider]  methocarbamol (ROBAXIN) 500 MG tablet Take 500 mg by mouth every 8 (eight) hours as needed for muscle spasms. 10/28/21   [provider]  mirabegron ER (MYRBETRIQ) 25 MG TB24 tablet Take 25 mg by mouth daily. 10/11/21   [provider]  Misc. Devices MISC New CPAP mask and supplies per patient preference for OSA. 10/21/18    [provider]  omeprazole (PRILOSEC) 40 MG capsule Take 40 mg by mouth daily. 11/01/19   [provider]  ondansetron (ZOFRAN) 4 MG tablet Take 4 mg by mouth every 8 (eight) hours as needed for nausea or vomiting. 07/13/21   [provider]  Polyethyl Glycol-Propyl Glycol (SYSTANE OP) Place 1 drop into both eyes in the morning, at noon, and at bedtime.    [provider]  polyethylene glycol (MIRALAX / GLYCOLAX) 17 g packet Take 17 g by mouth daily. 11/02/19   [provider]  pregabalin (LYRICA) 25 MG capsule Take 1 capsule (25 mg total) by mouth 2 (two) times daily. 01/03/23   Ghimire, Werner Lean, MD  Propylene Glycol (SYSTANE BALANCE) 0.6 % SOLN Place 1 drop into both eyes 3 times daily. 11/01/19   [provider]  Semaglutide,0.25 or 0.5MG /DOS, (OZEMPIC, 0.25 OR 0.5 MG/DOSE,) 2 MG/3ML SOPN Inject 0.5 mg into the skin every Wednesday.    [provider]  sennosides-docusate sodium (SENOKOT-S) 8.6-50 MG tablet Take 2 tablets by mouth in the morning and at bedtime.    [provider]  tamsulosin (FLOMAX) 0.4 MG CAPS capsule Take 1 capsule by mouth daily. 11/02/19   [provider]  traZODone (DESYREL) 50 MG tablet Take 1 tablet (50 mg total) by mouth at bedtime as needed for sleep. 01/03/23   Ghimire, Werner Lean, MD  trimethoprim (TRIMPEX) 100 MG tablet Take 50 mg by mouth daily. 11/01/21   [provider]                                                                                                                                    Past Surgical History Past Surgical History:  Procedure Laterality Date   CESAREAN SECTION     Family History No family history on file.  Social History Social History   Tobacco Use   Smoking status: Never  Substance Use Topics   Alcohol use: No   Allergies Peanut-containing drug products, Alprazolam, and Cauliflower [brassica oleracea]  Review of Systems Review of  Systems  Physical Exam Vital Signs  I have reviewed the triage vital signs BP (!) 146/71 (BP Location: Right Arm)   Pulse 67   Temp 98.2 F (36.8 C) (Oral)   Resp 18   Ht 5\' 4"  (1.626 m)   Wt 88.5 kg   SpO2 100%   BMI 33.47 kg/m   Physical Exam Vitals reviewed.  HENT:     Head: Normocephalic and atraumatic.  Cardiovascular:     Rate and Rhythm: Normal rate.     Pulses: Normal pulses.  Pulmonary:     Effort: Pulmonary effort is normal.  Abdominal:     General: Abdomen is flat. There is no distension.     Palpations: Abdomen is soft.     Tenderness: There is no abdominal tenderness. There is no guarding.  Musculoskeletal:        General: Normal range of motion.  Neurological:     General: No focal deficit present.     Mental Status: She is alert.     ED Results and Treatments Labs (all labs ordered are listed, but only abnormal results are displayed) Labs Reviewed  LIPASE, BLOOD  COMPREHENSIVE METABOLIC PANEL  CBC  URINALYSIS, ROUTINE W REFLEX MICROSCOPIC  URINALYSIS, W/ REFLEX TO CULTURE (INFECTION SUSPECTED)                                                                                                                          Radiology  No results found.  Pertinent labs & imaging results that were available during my care of the patient were reviewed by me and considered in my medical decision making (see MDM for details).  Medications Ordered in ED Medications  lactated ringers bolus 1,000 mL (has no administration in time range)  pantoprazole (PROTONIX) injection 40 mg (has no administration in time range)  ondansetron (ZOFRAN) injection 4 mg (has no administration in time range)                                                                                                                                     Procedures Procedures  (including critical care time)  Medical Decision Making / ED Course   This patient presents to the ED for concern of  abdominal pain and vomiting, this involves an extensive number of treatment options, and is a complaint that carries with it a high risk of complications and morbidity.  The differential diagnosis includes bowel obstruction, enteritis, drug reaction, cystitis, less likely ACS.  MDM: Will obtain CT imaging of patient's abdomen pelvis.  Labs ordered.  Patient overall with a soft abdomen.  Analgesia ordered, Protonix ordered.  Patient's pain does appear to be more epigastric in nature.  May be some gastritis.  Reassessment-2:30 PM: No significant leukocytosis, creatinine a bit worse than usual, however is roughly her baseline.  CT imaging of the abdomen pelvis was negative for acute process.  Patient's symptoms did improve with Zofran, was able to tolerate p.o. for me here in the emergency room.  Discussed this with the patient's daughter, she is agreeable to discharge back to skilled nursing facility.  Strict return precautions provided.   Additional history obtained: -Additional history obtained from  -External records from outside source obtained and reviewed including: Chart review including previous notes, labs, imaging, consultation notes   Lab Tests: -I ordered, reviewed, and interpreted labs.   The pertinent results include:   Labs Reviewed  LIPASE, BLOOD  COMPREHENSIVE METABOLIC PANEL  CBC  URINALYSIS, ROUTINE W REFLEX MICROSCOPIC  URINALYSIS, W/ REFLEX TO CULTURE (INFECTION SUSPECTED)         Imaging Studies ordered: I ordered imaging studies including CT imaging of the abdomen pelvis I independently visualized and interpreted imaging. I agree with the radiologist interpretation   Medicines ordered and prescription drug management: Meds ordered this encounter  Medications   lactated ringers bolus 1,000 mL   pantoprazole (PROTONIX) injection 40 mg   ondansetron (ZOFRAN) injection 4 mg    -I have reviewed the patients home medicines and have made adjustments as  needed  Cardiac Monitoring: The patient was maintained on a cardiac monitor.  I personally viewed and interpreted the cardiac monitored which showed an underlying rhythm of: Normal sinus rhythm  Social Determinants of Health:  Factors impacting patients care include: Multiple medical comorbidities including nursing home resident, chronic kidney disease   Reevaluation: After the interventions noted above,  I reevaluated the patient and found that they have :improved  Co morbidities that complicate the patient evaluation  Past Medical History:  Diagnosis Date   Anemia    Bipolar 1 disorder (HCC)    CHF (congestive heart failure) (HCC)    Depression    Diabetes mellitus without complication (HCC)    Hypertension    Neuropathy    Stroke (HCC)       Dispostion: I considered admission for this patient, however patient was able to tolerate p.o., likely would benefit from discharge over hospitalization.     Final Clinical Impression(s) / ED Diagnoses Final diagnoses:  None     @PCDICTATION @    Anders Simmonds T, DO 07/27/23 1435

## 2023-07-27 NOTE — Discharge Instructions (Signed)
 Blood work today was normal compared to older results.  The CT scan was negative.  I do think the symptoms could be related to the Ozempic.  I would recommend no longer receiving injections of those medications.  Please discuss this with your primary care doctor.  You may continue taking all medications as prescribed,  may begin taking Carafate with meals.  Return to the emergency room if you are unable to eat or drink.

## 2023-10-15 ENCOUNTER — Emergency Department (HOSPITAL_COMMUNITY)
Admission: EM | Admit: 2023-10-15 | Discharge: 2023-10-16 | Disposition: A | Discharge status: Home / Self Care | Attending: Emergency Medicine | Admitting: Emergency Medicine

## 2023-10-15 ENCOUNTER — Encounter (HOSPITAL_COMMUNITY): Payer: Self-pay

## 2023-10-15 ENCOUNTER — Other Ambulatory Visit: Payer: Self-pay

## 2023-10-15 ENCOUNTER — Emergency Department (HOSPITAL_COMMUNITY)

## 2023-10-15 DIAGNOSIS — Z7982 Long term (current) use of aspirin: Secondary | ICD-10-CM | POA: Diagnosis not present

## 2023-10-15 DIAGNOSIS — I509 Heart failure, unspecified: Secondary | ICD-10-CM | POA: Diagnosis not present

## 2023-10-15 DIAGNOSIS — Z91010 Allergy to peanuts: Secondary | ICD-10-CM | POA: Diagnosis not present

## 2023-10-15 DIAGNOSIS — I110 Hypertensive heart disease with heart failure: Secondary | ICD-10-CM | POA: Diagnosis not present

## 2023-10-15 DIAGNOSIS — R5383 Other fatigue: Secondary | ICD-10-CM | POA: Diagnosis present

## 2023-10-15 DIAGNOSIS — Z79899 Other long term (current) drug therapy: Secondary | ICD-10-CM | POA: Insufficient documentation

## 2023-10-15 DIAGNOSIS — R0602 Shortness of breath: Secondary | ICD-10-CM | POA: Insufficient documentation

## 2023-10-15 DIAGNOSIS — N3000 Acute cystitis without hematuria: Secondary | ICD-10-CM | POA: Insufficient documentation

## 2023-10-15 DIAGNOSIS — R1110 Vomiting, unspecified: Secondary | ICD-10-CM | POA: Diagnosis not present

## 2023-10-15 LAB — URINALYSIS, W/ REFLEX TO CULTURE (INFECTION SUSPECTED)
Bilirubin Urine: NEGATIVE
Glucose, UA: NEGATIVE mg/dL
Hgb urine dipstick: NEGATIVE
Ketones, ur: NEGATIVE mg/dL
Nitrite: NEGATIVE
Protein, ur: NEGATIVE mg/dL
Specific Gravity, Urine: 1.012 (ref 1.005–1.030)
pH: 5 (ref 5.0–8.0)

## 2023-10-15 LAB — COMPREHENSIVE METABOLIC PANEL WITH GFR
ALT: 10 U/L (ref 0–44)
AST: 13 U/L — ABNORMAL LOW (ref 15–41)
Albumin: 3.3 g/dL — ABNORMAL LOW (ref 3.5–5.0)
Alkaline Phosphatase: 41 U/L (ref 38–126)
Anion gap: 9 (ref 5–15)
BUN: 37 mg/dL — ABNORMAL HIGH (ref 8–23)
CO2: 23 mmol/L (ref 22–32)
Calcium: 10.1 mg/dL (ref 8.9–10.3)
Chloride: 100 mmol/L (ref 98–111)
Creatinine, Ser: 2.64 mg/dL — ABNORMAL HIGH (ref 0.44–1.00)
GFR, Estimated: 18 mL/min — ABNORMAL LOW (ref >60)
Glucose, Bld: 217 mg/dL — ABNORMAL HIGH (ref 70–99)
Potassium: 4 mmol/L (ref 3.5–5.1)
Sodium: 132 mmol/L — ABNORMAL LOW (ref 135–145)
Total Bilirubin: 0.6 mg/dL (ref 0.0–1.2)
Total Protein: 6.2 g/dL — ABNORMAL LOW (ref 6.5–8.1)

## 2023-10-15 LAB — CBC WITH DIFFERENTIAL/PLATELET
Abs Immature Granulocytes: 0.02 10*3/uL (ref 0.00–0.07)
Basophils Absolute: 0 10*3/uL (ref 0.0–0.1)
Basophils Relative: 1 %
Eosinophils Absolute: 0.3 10*3/uL (ref 0.0–0.5)
Eosinophils Relative: 4 %
HCT: 32.6 % — ABNORMAL LOW (ref 36.0–46.0)
Hemoglobin: 10.3 g/dL — ABNORMAL LOW (ref 12.0–15.0)
Immature Granulocytes: 0 %
Lymphocytes Relative: 18 %
Lymphs Abs: 1.5 10*3/uL (ref 0.7–4.0)
MCH: 28.5 pg (ref 26.0–34.0)
MCHC: 31.6 g/dL (ref 30.0–36.0)
MCV: 90.3 fL (ref 80.0–100.0)
Monocytes Absolute: 0.4 10*3/uL (ref 0.1–1.0)
Monocytes Relative: 5 %
Neutro Abs: 6.1 10*3/uL (ref 1.7–7.7)
Neutrophils Relative %: 72 %
Platelets: 199 10*3/uL (ref 150–400)
RBC: 3.61 MIL/uL — ABNORMAL LOW (ref 3.87–5.11)
RDW: 15.1 % (ref 11.5–15.5)
WBC: 8.4 10*3/uL (ref 4.0–10.5)
nRBC: 0 % (ref 0.0–0.2)

## 2023-10-15 LAB — BLOOD GAS, VENOUS
Acid-Base Excess: 6 mmol/L — ABNORMAL HIGH (ref 0.0–2.0)
Bicarbonate: 30.6 mmol/L — ABNORMAL HIGH (ref 20.0–28.0)
O2 Saturation: 54 %
Patient temperature: 37
pCO2, Ven: 43 mmHg — ABNORMAL LOW (ref 44–60)
pH, Ven: 7.46 — ABNORMAL HIGH (ref 7.25–7.43)
pO2, Ven: <31 mmHg — CL (ref 32–45)

## 2023-10-15 LAB — VALPROIC ACID LEVEL: Valproic Acid Lvl: <10 ug/mL — ABNORMAL LOW (ref 50–100)

## 2023-10-15 LAB — RESP PANEL BY RT-PCR (RSV, FLU A&B, COVID)  RVPGX2
Influenza A by PCR: NEGATIVE
Influenza B by PCR: NEGATIVE
Resp Syncytial Virus by PCR: NEGATIVE
SARS Coronavirus 2 by RT PCR: NEGATIVE

## 2023-10-15 LAB — TROPONIN I (HIGH SENSITIVITY)
Troponin I (High Sensitivity): 6 ng/L (ref <18)
Troponin I (High Sensitivity): 6 ng/L (ref <18)

## 2023-10-15 LAB — BRAIN NATRIURETIC PEPTIDE: B Natriuretic Peptide: 19.8 pg/mL (ref 0.0–100.0)

## 2023-10-15 MED ORDER — CEPHALEXIN 500 MG PO CAPS
500.0000 mg | ORAL_CAPSULE | Freq: Three times a day (TID) | ORAL | 0 refills | Status: AC
Start: 2023-10-15 — End: ?

## 2023-10-15 MED ORDER — SODIUM CHLORIDE 0.9 % IV SOLN
1.0000 g | Freq: Once | INTRAVENOUS | Status: AC
  Administered 2023-10-15: 1 g via INTRAVENOUS
  Filled 2023-10-15: qty 10

## 2023-10-15 MED ORDER — ONDANSETRON HCL 4 MG/2ML IJ SOLN
4.0000 mg | Freq: Once | INTRAMUSCULAR | Status: AC
  Administered 2023-10-15: 4 mg via INTRAVENOUS
  Filled 2023-10-15: qty 2

## 2023-10-15 NOTE — ED Triage Notes (Signed)
 BIB EMS from [place] for c/o weakness, SOB, and general fatigue x few days. Hx of UTI, family reports they think she is developing one. Family reports they were there all day and never saw the nurse, and they do not believe she is being taken care of. The nurse was unaware that family had called 911 when EMS arrived. Pt reports increased frequency of urination, denies pain with urination. Pt is AO x 4, GCS 15.

## 2023-10-15 NOTE — ED Provider Notes (Signed)
 [place] EMERGENCY DEPARTMENT AT [facility] Provider Note   CSN: [id number] Arrival date & time: 10/15/23  1931     History  Chief Complaint  Patient presents with   Shortness of Breath   Weakness    Samantha [name] is a 78 y.o. female history of heart failure, hypertension, here presenting with shortness of breath and weakness.  Patient is currently residing at [facility].  Patient has some shortness of breath and fatigue for several days.  Patient also has been vomiting and complaining of abdominal pain.  Family is concerned that she may be developing a UTI.  They were also concerned about how she is taking care of over there.  Patient states that she feels a little short of breath.  Patient unable to give me much history due to dementia.   The history is provided by the patient.       Home Medications Prior to Admission medications   Medication Sig Start Date End Date Taking? Authorizing Provider  acetaminophen  (TYLENOL ) 500 MG tablet Take 500 mg by mouth in the morning, at noon, and at bedtime. 11/01/19   [provider]  albuterol  (PROVENTIL  HFA;VENTOLIN  HFA) 108 (90 BASE) MCG/ACT inhaler Inhale 2 puffs into the lungs every 6 (six) hours as needed for wheezing. Patient taking differently: Inhale 2 puffs into the lungs every 8 (eight) hours as needed for wheezing or shortness of breath. 09/02/13   Sander Crooked, MD  albuterol  (PROVENTIL ) (2.5 MG/3ML) 0.083% nebulizer solution Take 2.5 mg by nebulization every 6 (six) hours as needed for wheezing or shortness of breath.    [provider]  amLODipine  (NORVASC ) 10 MG tablet Take 10 mg by mouth daily. 10/22/21   [provider]  aspirin  EC 81 MG tablet Take 81 mg by mouth daily. 10/24/21   [provider]  atorvastatin  (LIPITOR) 20 MG tablet Take 1 tablet by mouth at bedtime. 11/01/19   [provider]  bisacodyl  5 MG EC tablet Take 5 mg by mouth daily as needed for moderate  constipation.    [provider]  budesonide-formoterol  (SYMBICORT) 160-4.5 MCG/ACT inhaler Inhale 2 puffs into the lungs 2 (two) times daily. Patient not taking: Reported on 01/01/2023    [provider]  capsaicin (ZOSTRIX) 0.025 % cream Apply 1 Application topically at bedtime. 10/24/21   [provider]  carvedilol  (COREG ) 12.5 MG tablet Take 12.5 mg by mouth 2 (two) times daily. Patient not taking: Reported on 01/01/2023 10/24/21   [provider]  cetirizine (ZYRTEC) 10 MG tablet Take 10 mg by mouth daily.    [provider]  cinacalcet (SENSIPAR) 90 MG tablet Take 90 mg by mouth daily. 10/15/21   [provider]  divalproex  (DEPAKOTE  SPRINKLE) 125 MG capsule Take 1 capsule (125 mg total) by mouth 2 (two) times daily. 01/03/23   Ghimire, Estil Heman, MD  EPINEPHrine 0.3 mg/0.3 mL IJ SOAJ injection Inject 0.3 mg into the muscle as needed for anaphylaxis. 08/04/17   [provider]  fluticasone (FLONASE) 50 MCG/ACT nasal spray Place 1 spray into both nostrils daily.    [provider]  haloperidol  (HALDOL ) 0.5 MG tablet Take 0.5-1 mg by mouth See admin instructions. Take 1 tablet by mouth in the morning and 2 tablets at bedtime 11/01/19   [provider]  hydrALAZINE  (APRESOLINE ) 100 MG tablet Take 100 mg by mouth 3 (three) times daily. 10/12/21   [provider]  latanoprost  (XALATAN ) 0.005 % ophthalmic solution  Place 1 drop into both eyes nightly. 11/01/19   [provider]  lidocaine  4 % Place 1 patch onto the skin daily.    [provider]  magnesium  oxide (MAG-OX) 400 MG tablet Take 1 tablet by mouth 2 (two) times daily. 10/24/21   [provider]  methocarbamol (ROBAXIN) 500 MG tablet Take 500 mg by mouth every 8 (eight) hours as needed for muscle spasms. 10/28/21   [provider]  mirabegron ER (MYRBETRIQ) 25 MG TB24 tablet Take 25 mg by mouth daily. 10/11/21   [provider]  Misc. Devices MISC New CPAP mask and supplies per patient preference for OSA. 10/21/18   [provider]  omeprazole (PRILOSEC) 40 MG capsule Take 40 mg by mouth daily. 11/01/19   [provider]  ondansetron  (ZOFRAN ) 4 MG tablet Take 4 mg by mouth every 8 (eight) hours as needed for nausea or vomiting. 07/13/21   [provider]  Polyethyl Glycol-Propyl Glycol (SYSTANE OP) Place 1 drop into both eyes in the morning, at noon, and at bedtime.    [provider]  polyethylene glycol (MIRALAX / GLYCOLAX) 17 g packet Take 17 g by mouth daily. 11/02/19   [provider]  pregabalin  (LYRICA ) 25 MG capsule Take 1 capsule (25 mg total) by mouth 2 (two) times daily. 01/03/23   Ghimire, Estil Heman, MD  Propylene Glycol (SYSTANE BALANCE) 0.6 % SOLN Place 1 drop into both eyes 3 times daily. 11/01/19   [provider]  Semaglutide,0.25 or 0.5MG /DOS, (OZEMPIC, 0.25 OR 0.5 MG/DOSE,) 2 MG/3ML SOPN Inject 0.5 mg into the skin every Wednesday.    [provider]  sennosides-docusate sodium (SENOKOT-S) 8.6-50 MG tablet Take 2 tablets by mouth in the morning and at bedtime.    [provider]  sucralfate  (CARAFATE ) 1 GM/10ML suspension Take 10 mLs (1 g total) by mouth 4 (four) times daily -  with meals and at bedtime. 07/27/23   Afton Horse T, DO  tamsulosin  (FLOMAX ) 0.4 MG CAPS capsule Take 1 capsule by mouth daily. 11/02/19   [provider]  traZODone  (DESYREL ) 50 MG tablet Take 1 tablet (50 mg total) by mouth at bedtime as needed for sleep. 01/03/23   Ghimire, Estil Heman, MD  trimethoprim (TRIMPEX) 100 MG tablet Take 50 mg by mouth daily. 11/01/21   [provider]      Allergies    Peanut-containing drug products, Alprazolam , and Cauliflower [brassica oleracea]    Review of Systems   Review of Systems  Respiratory:  Positive for shortness of breath.   Neurological:  Positive for weakness.  All other systems  reviewed and are negative.   Physical Exam Updated Vital Signs BP (!) 137/56   Pulse 70   Temp 97.9 F (36.6 C) (Oral)   Resp 18   Ht 5\' 6"  (1.676 m)   Wt 75.3 kg   SpO2 100%   BMI 26.79 kg/m  Physical Exam Vitals and nursing note reviewed.  Constitutional:      Comments: Chronically ill  HENT:     Head: Normocephalic.     Mouth/Throat:     Mouth: Mucous membranes are moist.  Eyes:     Extraocular Movements: Extraocular movements intact.     Pupils: Pupils are equal, round, and reactive to light.  Cardiovascular:     Rate and Rhythm: Normal rate.  Pulmonary:     Comments: Slightly tachypneic and diminished bilateral bases Abdominal:     Palpations: Abdomen is  soft.     Comments: Mild epigastric tenderness  Musculoskeletal:        General: Normal range of motion.     Cervical back: Normal range of motion and neck supple.  Skin:    General: Skin is warm.     Capillary Refill: Capillary refill takes less than 2 seconds.  Neurological:     General: No focal deficit present.     Comments: Confused and ANO x 0.  Patient is moving all extremities  Psychiatric:        Mood and Affect: Mood normal.     ED Results / Procedures / Treatments   Labs (all labs ordered are listed, but only abnormal results are displayed) Labs Reviewed  RESP PANEL BY RT-PCR (RSV, FLU A&B, COVID)  [id number]  CBC WITH DIFFERENTIAL/PLATELET  COMPREHENSIVE METABOLIC PANEL WITH GFR  BRAIN NATRIURETIC PEPTIDE  BLOOD GAS, VENOUS  URINALYSIS, W/ REFLEX TO CULTURE (INFECTION SUSPECTED)  TROPONIN I (HIGH SENSITIVITY)    EKG None  Radiology No results found.  Procedures Procedures    Medications Ordered in ED Medications - No data to display  ED Course/ Medical Decision Making/ A&P                                 Medical Decision Making Suri Tafolla is a 78 y.o. female here presenting with shortness of breath and vomiting.  Consider heart failure exacerbation versus aspiration  pneumonia versus small bowel obstruction versus UTI versus COVID flu or RSV.  Plan to get CBC and CMP and BNP and troponin and UA and chest x-ray and CT abdomen pelvis.  10:51 PM I reviewed patient's labs and they were unremarkable.  In particular VBG is unremarkable and BNP is normal and troponin negative x 2.  UA showed questionable UTI.  Given patient has history of UTI, I have ordered Rocephin  and will discharge back to facility with Keflex.  I also reviewed her CT chest and pelvis and there is no bowel obstruction and no obvious pneumonia.  Patient has no oxygen  requirement.  At this point patient is stable for discharge back to facility  Problems Addressed: Acute cystitis without hematuria: acute illness or injury Vomiting, unspecified vomiting type, unspecified whether nausea present: acute illness or injury  Amount and/or Complexity of Data Reviewed Labs: ordered. Radiology: ordered and independent interpretation performed. Decision-making details documented in ED Course. ECG/medicine tests: ordered and independent interpretation performed. Decision-making details documented in ED Course.  Risk Prescription drug management.    Final Clinical Impression(s) / ED Diagnoses Final diagnoses:  None    Rx / DC Orders ED Discharge Orders     None         Dalene Duck, MD 10/15/23 2252

## 2023-10-15 NOTE — Discharge Instructions (Signed)
 You have a urinary tract infection  You were given Rocephin  in the ER and I have prescribed Keflex 500 mg 3 times daily for 5 days  See your doctor for follow-up  Return to ER if you have severe pain or vomiting or weakness

## 2023-10-16 NOTE — ED Notes (Signed)
 [facility] called for transport.

## 2023-10-17 LAB — URINE CULTURE: Culture: >=100000 — AB

## 2023-10-18 ENCOUNTER — Telehealth (HOSPITAL_BASED_OUTPATIENT_CLINIC_OR_DEPARTMENT_OTHER): Payer: Self-pay | Admitting: *Deleted

## 2023-10-18 NOTE — Telephone Encounter (Signed)
 Post ED Visit - Positive Culture Follow-up  Culture report reviewed by antimicrobial stewardship pharmacist: [facility] []  Court Distance, Pharm.D. []  Skeet Duke, Pharm.D., BCPS AQ-ID []  Leslee Rase, Pharm.D., BCPS []  Garland Junk, [address].D., BCPS []  [place], [place].D., BCPS, AAHIVP []  Alcide Aly, Pharm.D., BCPS, AAHIVP []  Jerri Morale, PharmD, BCPS []  Graham Laws, PharmD, BCPS []  Cleda Curly, PharmD, BCPS []  Tamar Fairly, PharmD []  Ballard Levels, PharmD, BCPS []  Ollen Beverage, PharmD  [facility] [x]  Denson Flake, PharmD []  Sherryle Don, PharmD []  Van Gelinas, PharmD []  Delila Felty, Rph []  [place]) Cleora Daft, PharmD []  Augustina Block, PharmD []  Arie Kurtz, PharmD []  Sharlyn Deaner, PharmD []  Agnes Hose, PharmD []  Kendall Pauls, PharmD []  Gladstone Lamer, PharmD []  Armanda Bern, PharmD []  Tera Fellows, PharmD   Positive urine culture Treated with cephalexin, organism sensitive to the same and no further patient follow-up is required at this time.  Zeb Heys 10/18/2023, 10:05 AM

## 2023-10-18 NOTE — Progress Notes (Signed)
 ED Antimicrobial Stewardship Positive Culture Follow Up   Samantha [name] is an 78 y.o. female who presented to [facility] on 10/15/2023 with a chief complaint of  Chief Complaint  Patient presents with   Shortness of Breath   Weakness    Recent Results (from the past 720 hours)  Resp panel by RT-PCR (RSV, Flu A&B, Covid) Anterior Nasal Swab     Status: None   Collection Time: 10/15/23  7:48 PM   Specimen: Anterior Nasal Swab  Result Value Ref Range Status   SARS Coronavirus 2 by RT PCR NEGATIVE NEGATIVE Final    Comment: (NOTE) SARS-CoV-2 target nucleic acids are NOT DETECTED.  The SARS-CoV-2 RNA is generally detectable in upper respiratory specimens during the acute phase of infection. The lowest concentration of SARS-CoV-2 viral copies this assay can detect is 138 copies/mL. A negative result does not preclude SARS-Cov-2 infection and should not be used as the sole basis for treatment or other patient management decisions. A negative result may occur with  improper specimen collection/handling, submission of specimen other than nasopharyngeal swab, presence of viral mutation(s) within the areas targeted by this assay, and inadequate number of viral copies(<138 copies/mL). A negative result must be combined with clinical observations, patient history, and epidemiological information. The expected result is Negative.  Fact Sheet for Patients:  BloggerCourse.com  Fact Sheet for Healthcare Providers:  SeriousBroker.it  This test is no t yet approved or cleared by the [place]  FDA and  has been authorized for detection and/or diagnosis of SARS-CoV-2 by FDA under an Emergency Use Authorization (EUA). This EUA will remain  in effect (meaning this test can be used) for the duration of the COVID-19 declaration under Section 564(b)(1) of the Act, 21 U.S.C.section 360bbb-3(b)(1), unless the authorization is terminated  or revoked  sooner.       Influenza A by PCR NEGATIVE NEGATIVE Final   Influenza B by PCR NEGATIVE NEGATIVE Final    Comment: (NOTE) The Xpert Xpress SARS-CoV-2/FLU/RSV plus assay is intended as an aid in the diagnosis of influenza from Nasopharyngeal swab specimens and should not be used as a sole basis for treatment. Nasal washings and aspirates are unacceptable for Xpert Xpress SARS-CoV-2/FLU/RSV testing.  Fact Sheet for Patients: BloggerCourse.com  Fact Sheet for Healthcare Providers: SeriousBroker.it  This test is not yet approved or cleared by the [place]  FDA and has been authorized for detection and/or diagnosis of SARS-CoV-2 by FDA under an Emergency Use Authorization (EUA). This EUA will remain in effect (meaning this test can be used) for the duration of the COVID-19 declaration under Section 564(b)(1) of the Act, 21 U.S.C. section 360bbb-3(b)(1), unless the authorization is terminated or revoked.     Resp Syncytial Virus by PCR NEGATIVE NEGATIVE Final    Comment: (NOTE) Fact Sheet for Patients: BloggerCourse.com  Fact Sheet for Healthcare Providers: SeriousBroker.it  This test is not yet approved or cleared by the [place]  FDA and has been authorized for detection and/or diagnosis of SARS-CoV-2 by FDA under an Emergency Use Authorization (EUA). This EUA will remain in effect (meaning this test can be used) for the duration of the COVID-19 declaration under Section 564(b)(1) of the Act, 21 U.S.C. section 360bbb-3(b)(1), unless the authorization is terminated or revoked.  Performed at [facility], [address] [place], [place] [address]   Urine Culture     Status: Abnormal   Collection Time: 10/15/23 10:18 PM   Specimen: Urine, Catheterized  Result Value Ref Range Status  Specimen Description   Final    URINE, CATHETERIZED Performed at  [facility], [address] [place], [place] [address]    Special Requests   Final    NONE Performed at [facility], [address], [place], [place] [address]    Culture (A)  Final    >=100,000 COLONIES/mL ESCHERICHIA COLI Confirmed Extended Spectrum Beta-Lactamase Producer (ESBL).  In bloodstream infections from ESBL organisms, carbapenems are preferred over piperacillin/tazobactam. They are shown to have a lower risk of mortality.    Report Status 10/17/2023 FINAL  Final   Organism ID, Bacteria ESCHERICHIA COLI (A)  Final      Susceptibility   Escherichia coli - MIC*    AMPICILLIN >=32 RESISTANT Resistant     CEFAZOLIN >=64 RESISTANT Resistant     CEFEPIME 8 INTERMEDIATE Intermediate     CEFTRIAXONE  >=64 RESISTANT Resistant     CIPROFLOXACIN >=4 RESISTANT Resistant     GENTAMICIN <=1 SENSITIVE Sensitive     IMIPENEM <=0.25 SENSITIVE Sensitive     NITROFURANTOIN <=16 SENSITIVE Sensitive     TRIMETH/SULFA >=320 RESISTANT Resistant     AMPICILLIN/SULBACTAM >=32 RESISTANT Resistant     PIP/TAZO 8 SENSITIVE Sensitive ug/mL    * >=100,000 COLONIES/mL ESCHERICHIA COLI    77YO female with weakness, fatigue,SOB, abdominal pain, some vomiting, and increased urinary frequency. UA was unremarkable. 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 Followed up with [facility]. Nurse states patient is doing fine. No changes needed.   ED Provider: Rupert Counts, PA-C   Vashti Gentles 10/18/2023, 9:35 AM Pharmacist Candidate [id number]  Monday - Friday phone -  [phone] Saturday - Sunday phone - [phone]

## 2024-01-02 ENCOUNTER — Ambulatory Visit: Admitting: Gastroenterology

## 2024-01-14 DEATH — deceased
